# Patient Record
Sex: Male | Born: 1945 | Race: White | Hispanic: No | Marital: Married | Smoking: Former smoker
Health system: Southern US, Community
[De-identification: ages and names within clinical notes are randomized; demographics above are authoritative.]

## PROBLEM LIST (undated history)

## (undated) DIAGNOSIS — E785 Hyperlipidemia, unspecified: Secondary | ICD-10-CM

## (undated) DIAGNOSIS — C801 Malignant (primary) neoplasm, unspecified: Secondary | ICD-10-CM

## (undated) DIAGNOSIS — K449 Diaphragmatic hernia without obstruction or gangrene: Secondary | ICD-10-CM

## (undated) DIAGNOSIS — I10 Essential (primary) hypertension: Secondary | ICD-10-CM

## (undated) DIAGNOSIS — F32A Depression, unspecified: Secondary | ICD-10-CM

## (undated) DIAGNOSIS — H269 Unspecified cataract: Secondary | ICD-10-CM

## (undated) DIAGNOSIS — K219 Gastro-esophageal reflux disease without esophagitis: Secondary | ICD-10-CM

## (undated) DIAGNOSIS — E079 Disorder of thyroid, unspecified: Secondary | ICD-10-CM

## (undated) DIAGNOSIS — J449 Chronic obstructive pulmonary disease, unspecified: Secondary | ICD-10-CM

## (undated) DIAGNOSIS — T7840XA Allergy, unspecified, initial encounter: Secondary | ICD-10-CM

## (undated) DIAGNOSIS — M1990 Unspecified osteoarthritis, unspecified site: Secondary | ICD-10-CM

## (undated) DIAGNOSIS — F329 Major depressive disorder, single episode, unspecified: Secondary | ICD-10-CM

## (undated) DIAGNOSIS — G4730 Sleep apnea, unspecified: Secondary | ICD-10-CM

## (undated) HISTORY — DX: Chronic obstructive pulmonary disease, unspecified: J44.9

## (undated) HISTORY — DX: Sleep apnea, unspecified: G47.30

## (undated) HISTORY — PX: TONSILLECTOMY: SUR1361

## (undated) HISTORY — DX: Essential (primary) hypertension: I10

## (undated) HISTORY — PX: LAPAROSCOPIC PARAESOPHAGEAL HERNIA REPAIR: SHX6307

## (undated) HISTORY — DX: Diaphragmatic hernia without obstruction or gangrene: K44.9

## (undated) HISTORY — DX: Major depressive disorder, single episode, unspecified: F32.9

## (undated) HISTORY — PX: HERNIA REPAIR: SHX51

## (undated) HISTORY — DX: Unspecified cataract: H26.9

## (undated) HISTORY — DX: Hyperlipidemia, unspecified: E78.5

## (undated) HISTORY — DX: Unspecified osteoarthritis, unspecified site: M19.90

## (undated) HISTORY — DX: Depression, unspecified: F32.A

## (undated) HISTORY — DX: Malignant (primary) neoplasm, unspecified: C80.1

## (undated) HISTORY — DX: Allergy, unspecified, initial encounter: T78.40XA

## (undated) HISTORY — DX: Gastro-esophageal reflux disease without esophagitis: K21.9

## (undated) HISTORY — DX: Disorder of thyroid, unspecified: E07.9

## (undated) HISTORY — PX: JOINT REPLACEMENT: SHX530

## (undated) HISTORY — PX: APPENDECTOMY: SHX54

---

## 2009-11-24 ENCOUNTER — Ambulatory Visit: Payer: Self-pay | Admitting: Gastroenterology

## 2009-11-26 LAB — PATHOLOGY REPORT

## 2010-05-20 ENCOUNTER — Ambulatory Visit: Payer: Self-pay | Admitting: Internal Medicine

## 2010-06-27 ENCOUNTER — Ambulatory Visit: Payer: Self-pay | Admitting: Vascular Surgery

## 2011-01-12 ENCOUNTER — Encounter: Payer: Self-pay | Admitting: Internal Medicine

## 2011-01-13 ENCOUNTER — Ambulatory Visit (INDEPENDENT_AMBULATORY_CARE_PROVIDER_SITE_OTHER): Payer: BC Managed Care – PPO | Admitting: Internal Medicine

## 2011-01-13 ENCOUNTER — Encounter: Payer: Self-pay | Admitting: Internal Medicine

## 2011-01-13 ENCOUNTER — Ambulatory Visit: Payer: Self-pay | Admitting: Internal Medicine

## 2011-01-13 VITALS — BP 132/81 | HR 87 | Temp 98.4°F | Resp 16 | Ht 71.0 in | Wt 215.0 lb

## 2011-01-13 DIAGNOSIS — Z01810 Encounter for preprocedural cardiovascular examination: Secondary | ICD-10-CM

## 2011-01-13 DIAGNOSIS — I10 Essential (primary) hypertension: Secondary | ICD-10-CM | POA: Insufficient documentation

## 2011-01-13 LAB — PROTIME-INR
INR: 0.93 (ref <1.50)
Prothrombin Time: 12.9 seconds (ref 11.6–15.2)

## 2011-01-13 NOTE — Progress Notes (Signed)
Subjective:    Patient ID: Alexander Fitzgerald, male    DOB: 1945/06/18, 65 y.o.   MRN: [id number]  HPI Alexander Fitzgerald is a 65 year old male who presents for preoperative visit prior to blepharoplasty. He reports that he is doing well. He denies any complaints today. He denies any recent illness. He denies any fever, chills, shortness of breath, cough. He denies any chest pain or palpitations. He denies any prior history of adverse reaction to anesthesia. He denies any history of bleeding.  Outpatient Encounter Prescriptions as of 01/13/2011  Medication Sig Dispense Refill  . albuterol (PROAIR HFA) 108 (90 BASE) MCG/ACT inhaler Inhale 2 puffs into the lungs 4 (four) times daily as needed.        . Ascorbic Acid (VITAMIN C) 1000 MG tablet Take 1,000 mg by mouth daily.        Marland Kitchen aspirin 81 MG tablet Take 81 mg by mouth daily.        . Cyanocobalamin (VITAMIN B-12) 5000 MCG SUBL Place 2 each under the tongue daily.        . finasteride (PROSCAR) 5 MG tablet Take 1 tablet by mouth Daily.      . Fluticasone-Salmeterol (ADVAIR DISKUS) 250-50 MCG/DOSE AEPB Inhale 1 puff into the lungs every other day.        . folic acid (FOLVITE) 800 MCG tablet Take 800 mcg by mouth daily.       Marland Kitchen lisinopril (PRINIVIL,ZESTRIL) 10 MG tablet Take 10 mg by mouth daily.        . montelukast (SINGULAIR) 10 MG tablet Take 10 mg by mouth at bedtime.        . Multiple Vitamin (MULTIVITAMIN) capsule Take 1 capsule by mouth daily.        Marland Kitchen omeprazole (PRILOSEC) 20 MG capsule Take 20 mg by mouth daily.        . Pyridoxine HCl (VITAMIN B-6 CR) 200 MG TBCR Take by mouth.        . Pyridoxine HCl (VITAMIN B-6 PO) Take 1 capsule by mouth daily.          Review of Systems  Constitutional: Negative for fever, chills, activity change, appetite change, fatigue and unexpected weight change.  Eyes: Negative for visual disturbance.  Respiratory: Negative for cough and shortness of breath.   Cardiovascular: Negative for chest pain,  palpitations and leg swelling.  Gastrointestinal: Negative for abdominal pain and abdominal distention.  Genitourinary: Negative for dysuria, urgency and difficulty urinating.  Musculoskeletal: Negative for arthralgias and gait problem.  Skin: Negative for color change and rash.  Hematological: Negative for adenopathy.  Psychiatric/Behavioral: Negative for sleep disturbance and dysphoric mood. The patient is not nervous/anxious.    BP 132/81  Pulse 87  Temp(Src) 98.4 F (36.9 C) (Oral)  Resp 16  Ht 5\' 11"  (1.803 m)  Wt 215 lb (97.523 kg)  BMI 29.99 kg/m2  SpO2 97%     Objective:   Physical Exam  Constitutional: He is oriented to person, place, and time. He appears well-developed and well-nourished. No distress.  HENT:  Head: Normocephalic and atraumatic.  Right Ear: External ear normal.  Left Ear: External ear normal.  Nose: Nose normal.  Mouth/Throat: Oropharynx is clear and moist. No oropharyngeal exudate.  Eyes: Conjunctivae and EOM are normal. Pupils are equal, round, and reactive to light. Right eye exhibits no discharge. Left eye exhibits no discharge. No scleral icterus.  Neck: Normal range of motion. Neck supple. No tracheal deviation present. No thyromegaly present.  Cardiovascular:  Normal rate, regular rhythm and normal heart sounds.  Exam reveals no gallop and no friction rub.   No murmur heard. Pulmonary/Chest: Effort normal and breath sounds normal. No respiratory distress. He has no wheezes. He has no rales. He exhibits no tenderness.  Musculoskeletal: Normal range of motion. He exhibits no edema.  Lymphadenopathy:    He has no cervical adenopathy.  Neurological: He is alert and oriented to person, place, and time. No cranial nerve deficit. Coordination normal.  Skin: Skin is warm and dry. No rash noted. He is not diaphoretic. No erythema. No pallor.  Psychiatric: He has a normal mood and affect. His behavior is normal. Judgment and thought content normal.           Assessment & Plan:  1. Preop eval - based on modified risk criteria Alexander Fitzgerald would be low risk for perioperative cardiac events. His exam is normal today. The paperwork he brings today requires him to have an EKG and chest x-ray prior to surgery. It also requires lab work including CBC, CMP, and PT. we will order the above tests today. We will fax over the necessary documents to his surgeon. He will followup in clinic in 6 months.

## 2011-01-14 LAB — CBC WITH DIFFERENTIAL/PLATELET
Basophils Absolute: 0 10*3/uL (ref 0.0–0.1)
Basophils Relative: 0.2 % (ref 0.0–3.0)
Hemoglobin: 15.9 g/dL (ref 13.0–17.0)
Lymphocytes Relative: 19.5 % (ref 12.0–46.0)
Monocytes Relative: 4.4 % (ref 3.0–12.0)
Neutro Abs: 6.1 10*3/uL (ref 1.4–7.7)
Neutrophils Relative %: 74.1 % (ref 43.0–77.0)
RBC: 5.51 Mil/uL (ref 4.22–5.81)

## 2011-01-15 ENCOUNTER — Telehealth: Payer: Self-pay | Admitting: *Deleted

## 2011-01-15 ENCOUNTER — Encounter: Payer: Self-pay | Admitting: Internal Medicine

## 2011-01-15 ENCOUNTER — Telehealth: Payer: Self-pay | Admitting: Internal Medicine

## 2011-01-15 LAB — COMPREHENSIVE METABOLIC PANEL
Alkaline Phosphatase: 56 U/L (ref 39–117)
BUN: 22 mg/dL (ref 6–23)
Creatinine, Ser: 1.1 mg/dL (ref 0.4–1.5)
Glucose, Bld: 106 mg/dL — ABNORMAL HIGH (ref 70–99)
Sodium: 140 mEq/L (ref 135–145)
Total Bilirubin: 1.3 mg/dL — ABNORMAL HIGH (ref 0.3–1.2)

## 2011-01-15 NOTE — Telephone Encounter (Signed)
Message copied by Jobie Quaker on Fri Jan 15, 2011  9:00 AM ------      Message from: Ronna Polio A      Created: Thu Jan 14, 2011 12:22 PM      Regarding: CXR       CXR was normal

## 2011-01-15 NOTE — Telephone Encounter (Signed)
Patient notified

## 2011-01-18 NOTE — Telephone Encounter (Signed)
Opened in error

## 2011-01-19 ENCOUNTER — Ambulatory Visit (INDEPENDENT_AMBULATORY_CARE_PROVIDER_SITE_OTHER): Payer: BC Managed Care – PPO | Admitting: *Deleted

## 2011-01-19 DIAGNOSIS — R9431 Abnormal electrocardiogram [ECG] [EKG]: Secondary | ICD-10-CM

## 2011-01-21 ENCOUNTER — Telehealth: Payer: Self-pay | Admitting: Internal Medicine

## 2011-01-21 NOTE — Telephone Encounter (Signed)
I have [name] scheduled with Dr. Mariah Milling on the 9th of October at 2:30. I have also tried calling patient and left msg for him to return my call.

## 2011-01-21 NOTE — Telephone Encounter (Signed)
Message copied by Lysbeth Galas on Thu Jan 21, 2011  8:46 AM ------      Message from: Ronna Polio A      Created: Wed Jan 20, 2011  6:59 PM       Carollee Herter, Can you set this pt up with Dr. Mariah Milling? Needs to be fairly soon as he has pending surgery. Reason is abnormal EKG.

## 2011-01-25 ENCOUNTER — Encounter: Payer: Self-pay | Admitting: Internal Medicine

## 2011-01-26 ENCOUNTER — Ambulatory Visit: Payer: BC Managed Care – PPO | Admitting: Cardiovascular Disease

## 2011-01-27 ENCOUNTER — Telehealth: Payer: Self-pay | Admitting: Internal Medicine

## 2011-01-27 NOTE — Telephone Encounter (Signed)
Patient wants his appointment rescheduled with Dr. Lewie Loron in the early part of December . Please call patient on cell phone in regards to this appointment.

## 2011-01-28 ENCOUNTER — Encounter: Payer: Self-pay | Admitting: Internal Medicine

## 2011-01-29 ENCOUNTER — Encounter: Payer: Self-pay | Admitting: Cardiovascular Disease

## 2011-01-29 NOTE — Telephone Encounter (Signed)
When I spoke with Mr. Deiss he was giving the number to reschedule this appt. I also called Asher Muir to advise her that the patient would be calling in to reschedule this appt.

## 2011-03-03 ENCOUNTER — Ambulatory Visit (INDEPENDENT_AMBULATORY_CARE_PROVIDER_SITE_OTHER): Payer: BC Managed Care – PPO | Admitting: Internal Medicine

## 2011-03-03 ENCOUNTER — Encounter: Payer: Self-pay | Admitting: Internal Medicine

## 2011-03-03 ENCOUNTER — Telehealth: Payer: Self-pay | Admitting: *Deleted

## 2011-03-03 VITALS — BP 112/76 | HR 83 | Temp 98.4°F | Wt 213.0 lb

## 2011-03-03 DIAGNOSIS — K529 Noninfective gastroenteritis and colitis, unspecified: Secondary | ICD-10-CM

## 2011-03-03 DIAGNOSIS — K5289 Other specified noninfective gastroenteritis and colitis: Secondary | ICD-10-CM

## 2011-03-03 MED ORDER — METRONIDAZOLE 500 MG PO TABS: 500.0000 mg | ORAL_TABLET | Freq: Three times a day (TID) | ORAL | Status: AC

## 2011-03-03 MED ORDER — CIPROFLOXACIN HCL 500 MG PO TABS: 500.0000 mg | ORAL_TABLET | Freq: Two times a day (BID) | ORAL | Status: AC

## 2011-03-03 NOTE — Telephone Encounter (Signed)
Spoke w/pt - he c/o diarrhea x 1 wk, w/little help from imodium. No fever, visible blood or mucus in the stool. Advised apt and scheduled for OV today

## 2011-03-03 NOTE — Progress Notes (Signed)
Subjective:    Patient ID: Alexander Fitzgerald, male    DOB: May 03, 1945, 65 y.o.   MRN: [id number]  HPI 65 year old male presents for an acute visit complaining of diarrhea. He notes that approximately one week ago he was traveling in [place] when he developed the sudden onset of watery diarrhea. He reports several episodes of watery diarrhea per day. This persisted for several days. He denies any fever or chills during that time. He did have some mild crampy abdominal pain. He started taking Imodium and had resolution of his symptoms. He reports that he had a bowel movement this morning which was formed. He denies any blood in his stool. He denies any mucus in his stool. None of his travel companions experienced similar symptoms. He is planning to leave again for a trip tomorrow to [place], [place].   Outpatient Encounter Prescriptions as of 03/03/2011  Medication Sig Dispense Refill  . albuterol (PROAIR HFA) 108 (90 BASE) MCG/ACT inhaler Inhale 2 puffs into the lungs 4 (four) times daily as needed.        . Ascorbic Acid (VITAMIN C) 1000 MG tablet Take 1,000 mg by mouth daily.        Marland Kitchen aspirin 81 MG tablet Take 81 mg by mouth daily.        . Cyanocobalamin (VITAMIN B-12) 5000 MCG SUBL Place 2 each under the tongue daily.        . finasteride (PROSCAR) 5 MG tablet Take 1 tablet by mouth Daily.      . Fluticasone-Salmeterol (ADVAIR DISKUS) 250-50 MCG/DOSE AEPB Inhale 1 puff into the lungs every other day.        . folic acid (FOLVITE) 800 MCG tablet Take 800 mcg by mouth daily.       Marland Kitchen lisinopril (PRINIVIL,ZESTRIL) 10 MG tablet Take 10 mg by mouth daily.        . montelukast (SINGULAIR) 10 MG tablet Take 10 mg by mouth at bedtime.        . Multiple Vitamin (MULTIVITAMIN) capsule Take 1 capsule by mouth daily.        Marland Kitchen omeprazole (PRILOSEC) 20 MG capsule Take 20 mg by mouth daily.        . Pyridoxine HCl (VITAMIN B-6 CR) 200 MG TBCR Take by mouth.          Review of Systems  Constitutional:  Negative for fever, chills, activity change, appetite change, fatigue and unexpected weight change.  Eyes: Negative for visual disturbance.  Respiratory: Negative for cough and shortness of breath.   Cardiovascular: Negative for chest pain and leg swelling.  Gastrointestinal: Positive for nausea, abdominal pain (mild) and diarrhea. Negative for blood in stool, abdominal distention and anal bleeding.  Genitourinary: Negative for dysuria, urgency and difficulty urinating.  Musculoskeletal: Negative for arthralgias and gait problem.  Skin: Negative for color change and rash.  Hematological: Negative for adenopathy.   BP 112/76  Pulse 83  Temp(Src) 98.4 F (36.9 C) (Oral)  Wt 213 lb (96.616 kg)  SpO2 96%     Objective:   Physical Exam  Constitutional: He is oriented to person, place, and time. He appears well-developed and well-nourished. No distress.  HENT:  Head: Normocephalic and atraumatic.  Right Ear: External ear normal.  Left Ear: External ear normal.  Nose: Nose normal.  Mouth/Throat: Oropharynx is clear and moist. No oropharyngeal exudate.  Eyes: Conjunctivae and EOM are normal. Pupils are equal, round, and reactive to light. Right eye exhibits no discharge. Left eye exhibits  no discharge. No scleral icterus.  Neck: Normal range of motion. Neck supple. No tracheal deviation present. No thyromegaly present.  Cardiovascular: Normal rate, regular rhythm and normal heart sounds.  Exam reveals no gallop and no friction rub.   No murmur heard. Pulmonary/Chest: Effort normal and breath sounds normal. No respiratory distress. He has no wheezes. He has no rales. He exhibits no tenderness.  Abdominal: Soft. Bowel sounds are normal. He exhibits no distension and no mass. There is no tenderness. There is no rebound and no guarding.  Musculoskeletal: Normal range of motion. He exhibits no edema.  Lymphadenopathy:    He has no cervical adenopathy.  Neurological: He is alert and oriented to  person, place, and time. No cranial nerve deficit. Coordination normal.  Skin: Skin is warm and dry. No rash noted. He is not diaphoretic. No erythema. No pallor.  Psychiatric: He has a normal mood and affect. His behavior is normal. Judgment and thought content normal.          Assessment & Plan:  1. Gastroenteritis - Likely viral, appears to be resolved. However, given that he is planning to travel this week, will attempt to obtain a stool sample (should his diarrhea recur) to check for bacterial causes of gastroenteritis. Should his diarrhea recur, he will also start Cipro and Flagyl. He will call or return to clinic if his symptoms persist.

## 2011-03-29 ENCOUNTER — Telehealth: Payer: Self-pay | Admitting: *Deleted

## 2011-03-29 NOTE — Telephone Encounter (Signed)
OK on all

## 2011-03-29 NOTE — Telephone Encounter (Signed)
Pt left VM today -   1. Req RX for shingles vaccine 2. Req Rf of singulair, omeprazole and lisinopril to go to [facility].   OK?

## 2011-03-30 MED ORDER — ZOSTER VACCINE LIVE 19400 UNT/0.65ML ~~LOC~~ SOLR: 0.6500 mL | Freq: Once | SUBCUTANEOUS | Status: AC

## 2011-03-30 MED ORDER — MONTELUKAST SODIUM 10 MG PO TABS: 10.0000 mg | ORAL_TABLET | Freq: Every day | ORAL | Status: DC

## 2011-03-30 MED ORDER — LISINOPRIL 10 MG PO TABS: 10.0000 mg | ORAL_TABLET | Freq: Every day | ORAL | Status: DC

## 2011-03-30 MED ORDER — OMEPRAZOLE 20 MG PO CPDR: 20.0000 mg | DELAYED_RELEASE_CAPSULE | Freq: Every day | ORAL | Status: DC

## 2011-03-30 NOTE — Telephone Encounter (Signed)
Done, left vm on pt's cell to check w/pharm

## 2011-04-23 ENCOUNTER — Ambulatory Visit (INDEPENDENT_AMBULATORY_CARE_PROVIDER_SITE_OTHER): Payer: BC Managed Care – PPO | Admitting: Cardiovascular Disease

## 2011-04-23 ENCOUNTER — Encounter: Payer: Self-pay | Admitting: Cardiovascular Disease

## 2011-04-23 VITALS — BP 110/80 | HR 86 | Ht 71.0 in | Wt 211.0 lb

## 2011-04-23 DIAGNOSIS — I10 Essential (primary) hypertension: Secondary | ICD-10-CM

## 2011-04-23 DIAGNOSIS — I4510 Unspecified right bundle-branch block: Secondary | ICD-10-CM | POA: Insufficient documentation

## 2011-04-23 DIAGNOSIS — R9431 Abnormal electrocardiogram [ECG] [EKG]: Secondary | ICD-10-CM

## 2011-04-23 DIAGNOSIS — R0602 Shortness of breath: Secondary | ICD-10-CM | POA: Insufficient documentation

## 2011-04-23 NOTE — Assessment & Plan Note (Signed)
Blood pressure is well controlled on today's visit. No changes made to the medications. 

## 2011-04-23 NOTE — Patient Instructions (Addendum)
You are doing well. No medication changes were made.  Please call us if you have new issues that need to be addressed before your next appt.  You could consider RED YEAST RICE for cholesterol

## 2011-04-23 NOTE — Assessment & Plan Note (Signed)
We talked about his EKG which shows an incomplete right bundle branch block. Given that he does not have any symptoms, generally we do not need to perform any additional testing. He will contact us if he has any worsening shortness of breath or chest pain.

## 2011-04-23 NOTE — Progress Notes (Signed)
Patient ID: Alexander Fitzgerald, male    DOB: September 29, 1945, 66 y.o.   MRN: [id number]  HPI Comments: Alexander Fitzgerald is a very pleasant 66 year old gentleman with a 20 year smoking history, mild asthma exacerbated by upper respiratory infections, hypertension, gout, plantar fasciitis who presents by referral from Dr. Dan Humphreys for abnormal EKG and shortness of breath.  He reports that typically he is very active. He walks his dog 3 miles per day, plays golf. He is more deconditioned this year secondary to significant family stressors such as the loss of family members and other stressful event. He has not been working out as much as he normally does and has gained weight. He has 10-20 pounds more than he would like it feels more deconditioned. He is able to stay very active and has 3 flights of stairs at home that he can climb without difficulty. He reports that he was recently cleaning out the top level and climb the stairs 20 times only having to stop for brief periods. He has rare asthma exacerbations usually triggered by upper respiratory infection.   He denies any chest pain, lightheadedness or dizziness. He is uncertain if he has had previous EKGs. He has difficulty walking hills secondary to plantar fasciitis which has been severe.  EKG today shows normal sinus rhythm with incomplete right bundle branch block, rate 86 beats per minute no other significant ST or T wave changes   Outpatient Encounter Prescriptions as of 04/23/2011  Medication Sig Dispense Refill  . albuterol (PROAIR HFA) 108 (90 BASE) MCG/ACT inhaler Inhale 2 puffs into the lungs 4 (four) times daily as needed.        . Ascorbic Acid (VITAMIN C) 1000 MG tablet Take 1,000 mg by mouth daily.        Marland Kitchen aspirin 81 MG tablet Take 81 mg by mouth daily.        . Cyanocobalamin (VITAMIN B-12) 5000 MCG SUBL Place 2 each under the tongue daily.        . finasteride (PROSCAR) 5 MG tablet Take 1 tablet by mouth Daily.      . Fluticasone-Salmeterol  (ADVAIR DISKUS) 250-50 MCG/DOSE AEPB Inhale 1 puff into the lungs every other day.        . folic acid (FOLVITE) 800 MCG tablet Take 800 mcg by mouth daily.       Marland Kitchen lisinopril (PRINIVIL,ZESTRIL) 10 MG tablet Take 1 tablet (10 mg total) by mouth daily.  90 tablet  1  . montelukast (SINGULAIR) 10 MG tablet Take 1 tablet (10 mg total) by mouth at bedtime.  90 tablet  1  . Multiple Vitamin (MULTIVITAMIN) capsule Take 1 capsule by mouth daily.        Marland Kitchen omeprazole (PRILOSEC) 20 MG capsule Take 1 capsule (20 mg total) by mouth daily.  90 capsule  1  . Pyridoxine HCl (VITAMIN B-6 CR) 200 MG TBCR Take by mouth.           Review of Systems  Constitutional: Negative.   HENT: Negative.   Eyes: Negative.   Respiratory: Negative.   Cardiovascular: Negative.   Gastrointestinal: Negative.   Musculoskeletal: Negative.   Skin: Negative.   Neurological: Negative.   Hematological: Negative.   Psychiatric/Behavioral: Negative.   All other systems reviewed and are negative.    BP 110/80  Pulse 86  Ht 5\' 11"  (1.803 m)  Wt 211 lb (95.709 kg)  BMI 29.43 kg/m2  Physical Exam  Nursing note and vitals reviewed. Constitutional: He is  oriented to person, place, and time. He appears well-developed and well-nourished.  HENT:  Head: Normocephalic.  Nose: Nose normal.  Mouth/Throat: Oropharynx is clear and moist.  Eyes: Conjunctivae are normal. Pupils are equal, round, and reactive to light.  Neck: Normal range of motion. Neck supple. No JVD present.  Cardiovascular: Normal rate, regular rhythm, S1 normal, S2 normal, normal heart sounds and intact distal pulses.  Exam reveals no gallop and no friction rub.   No murmur heard. Pulmonary/Chest: Effort normal and breath sounds normal. No respiratory distress. He has no wheezes. He has no rales. He exhibits no tenderness.  Abdominal: Soft. Bowel sounds are normal. He exhibits no distension. There is no tenderness.  Musculoskeletal: Normal range of motion. He  exhibits no edema and no tenderness.  Lymphadenopathy:    He has no cervical adenopathy.  Neurological: He is alert and oriented to person, place, and time. Coordination normal.  Skin: Skin is warm and dry. No rash noted. No erythema.  Psychiatric: He has a normal mood and affect. His behavior is normal. Judgment and thought content normal.           Assessment and Plan

## 2011-04-23 NOTE — Assessment & Plan Note (Signed)
We spent some time talking about his shortness of breath. He is otherwise very active even with moderate activity.His weight is up from his baseline and he is more deconditioned.  He would be unable to treadmill given his plantar fasciitis. We will continue to monitor him and if symptoms get worse we did discuss cardiac CT or Myoview stress test. I feel it is safe to watch him for now and he can call Dr. Dan Humphreys or our office if he has worsening symptoms. We have suggested he stay on aspirin. Cholesterol is well controlled.

## 2011-04-27 LAB — HM COLONOSCOPY: HM Colonoscopy: NORMAL

## 2011-07-26 ENCOUNTER — Telehealth: Payer: Self-pay | Admitting: Internal Medicine

## 2011-07-26 MED ORDER — FINASTERIDE 5 MG PO TABS: 5.0000 mg | ORAL_TABLET | Freq: Every day | ORAL | Status: DC

## 2011-07-26 NOTE — Telephone Encounter (Signed)
That is fine 

## 2011-07-26 NOTE — Telephone Encounter (Signed)
Is this okay?

## 2011-07-26 NOTE — Telephone Encounter (Signed)
[phone] Pt walked in wanting rx for finaresride 5mg  everyother day  Dr Madelaine Bhat [facility] orginally wrote the rx [employer]

## 2011-07-27 NOTE — Telephone Encounter (Signed)
Patient notified by telephone. 

## 2011-08-13 ENCOUNTER — Telehealth: Payer: Self-pay | Admitting: *Deleted

## 2011-08-13 DIAGNOSIS — Z23 Encounter for immunization: Secondary | ICD-10-CM

## 2011-08-13 MED ORDER — ZOSTER VACCINE LIVE 19400 UNT/0.65ML ~~LOC~~ SOLR: 0.6500 mL | Freq: Once | SUBCUTANEOUS | Status: AC

## 2011-08-13 NOTE — Telephone Encounter (Signed)
[phone]  Pt woulld rx for the singles injection Pt would like to pick up rx please call when ready

## 2011-08-13 NOTE — Telephone Encounter (Signed)
Patient notified that rx is ready for pick up

## 2011-08-13 NOTE — Telephone Encounter (Signed)
Is this okay?

## 2011-08-13 NOTE — Telephone Encounter (Signed)
Rx for shingles vaccine printed.

## 2011-09-29 DIAGNOSIS — H04123 Dry eye syndrome of bilateral lacrimal glands: Secondary | ICD-10-CM | POA: Insufficient documentation

## 2011-10-12 DIAGNOSIS — H01003 Unspecified blepharitis right eye, unspecified eyelid: Secondary | ICD-10-CM | POA: Insufficient documentation

## 2011-10-12 DIAGNOSIS — H04129 Dry eye syndrome of unspecified lacrimal gland: Secondary | ICD-10-CM | POA: Insufficient documentation

## 2011-10-24 ENCOUNTER — Other Ambulatory Visit: Payer: Self-pay | Admitting: Internal Medicine

## 2011-10-27 ENCOUNTER — Telehealth: Payer: Self-pay | Admitting: *Deleted

## 2011-10-27 NOTE — Telephone Encounter (Signed)
Received fax from pharmacy stating that PA is needed for Singulair 10mg .  PA form given to Dr. Dan Humphreys for completion and signature.

## 2011-11-03 ENCOUNTER — Telehealth: Payer: Self-pay | Admitting: *Deleted

## 2011-11-03 NOTE — Telephone Encounter (Signed)
PA form given to Dr. Dan Humphreys.

## 2011-11-03 NOTE — Telephone Encounter (Signed)
Received fax from pharmacy stating that PA is needed on Singulair 10mg .  Called [employer] [place] at [phone] and requested PA form, they will fax form in about 5 minutes.

## 2011-11-04 ENCOUNTER — Other Ambulatory Visit: Payer: Self-pay | Admitting: *Deleted

## 2011-11-05 ENCOUNTER — Telehealth: Payer: Self-pay | Admitting: Internal Medicine

## 2011-11-05 NOTE — Telephone Encounter (Signed)
Aline August @ [employer] called to let you know she has approval for the medication she will also sent letter

## 2011-11-05 NOTE — Telephone Encounter (Signed)
Patient advised as instructed via telephone. 

## 2012-01-21 ENCOUNTER — Other Ambulatory Visit: Payer: Self-pay | Admitting: Internal Medicine

## 2012-04-06 ENCOUNTER — Other Ambulatory Visit: Payer: Self-pay | Admitting: Internal Medicine

## 2012-04-26 ENCOUNTER — Ambulatory Visit (INDEPENDENT_AMBULATORY_CARE_PROVIDER_SITE_OTHER): Payer: Medicare Other | Admitting: Internal Medicine

## 2012-04-26 ENCOUNTER — Encounter: Payer: Self-pay | Admitting: Internal Medicine

## 2012-04-26 VITALS — BP 130/90 | HR 85 | Temp 97.8°F | Ht 71.0 in | Wt 210.0 lb

## 2012-04-26 DIAGNOSIS — Z0000 Encounter for general adult medical examination without abnormal findings: Secondary | ICD-10-CM

## 2012-04-26 DIAGNOSIS — I10 Essential (primary) hypertension: Secondary | ICD-10-CM

## 2012-04-26 DIAGNOSIS — Z125 Encounter for screening for malignant neoplasm of prostate: Secondary | ICD-10-CM

## 2012-04-26 DIAGNOSIS — E785 Hyperlipidemia, unspecified: Secondary | ICD-10-CM

## 2012-04-26 LAB — LIPID PANEL
HDL: 43.5 mg/dL (ref >39.00)
LDL Cholesterol: 105 mg/dL — ABNORMAL HIGH (ref 0–99)
Total CHOL/HDL Ratio: 4
Triglycerides: 140 mg/dL (ref 0.0–149.0)
VLDL: 28 mg/dL (ref 0.0–40.0)

## 2012-04-26 LAB — COMPREHENSIVE METABOLIC PANEL
AST: 30 U/L (ref 0–37)
Albumin: 3.9 g/dL (ref 3.5–5.2)
Alkaline Phosphatase: 58 U/L (ref 39–117)
BUN: 14 mg/dL (ref 6–23)
Creatinine, Ser: 1.1 mg/dL (ref 0.4–1.5)
Potassium: 4.2 mEq/L (ref 3.5–5.1)
Total Bilirubin: 1.2 mg/dL (ref 0.3–1.2)

## 2012-04-26 MED ORDER — ZOSTER VACCINE LIVE 19400 UNT/0.65ML ~~LOC~~ SOLR: 0.6500 mL | Freq: Once | SUBCUTANEOUS | Status: DC

## 2012-04-26 NOTE — Assessment & Plan Note (Signed)
BP well controlled on current medications. Will continue current medications. Will check renal function and urine microalbumin with labs today. Follow up in 6 months and prn.

## 2012-04-26 NOTE — Progress Notes (Signed)
Subjective:    Patient ID: Quayshawn Nin, male    DOB: Jul 31, 1945, 67 y.o.   MRN: [id number]  HPI 67 year old male with history of hypertension presents for followup. He reports he is generally feeling well. He reports full compliance with his medications. He denies any recent chest pain, palpitations, headache. He denies any new concerns today.  Outpatient Encounter Prescriptions as of 04/26/2012  Medication Sig Dispense Refill  . albuterol (PROAIR HFA) 108 (90 BASE) MCG/ACT inhaler Inhale 2 puffs into the lungs 4 (four) times daily as needed.        . Ascorbic Acid (VITAMIN C) 1000 MG tablet Take 1,000 mg by mouth daily.        Marland Kitchen aspirin 81 MG tablet Take 81 mg by mouth daily.        . Cyanocobalamin (VITAMIN B-12) 5000 MCG SUBL Place 2 each under the tongue daily.        . finasteride (PROSCAR) 5 MG tablet Take 1 tablet (5 mg total) by mouth daily.  30 tablet  3  . Fluticasone-Salmeterol (ADVAIR DISKUS) 250-50 MCG/DOSE AEPB Inhale 1 puff into the lungs every other day.        . folic acid (FOLVITE) 800 MCG tablet Take 800 mcg by mouth daily.       Marland Kitchen lisinopril (PRINIVIL,ZESTRIL) 10 MG tablet TAKE 1 TABLET (10 MG TOTAL) BY MOUTH DAILY.  90 tablet  1  . montelukast (SINGULAIR) 10 MG tablet TAKE 1 TABLET (10 MG TOTAL) BY MOUTH AT BEDTIME.  90 tablet  1  . Multiple Vitamin (MULTIVITAMIN) capsule Take 1 capsule by mouth daily.        Marland Kitchen omeprazole (PRILOSEC) 20 MG capsule TAKE 1 CAPSULE (20 MG TOTAL) BY MOUTH DAILY.  90 capsule  1  . Pyridoxine HCl (VITAMIN B-6 CR) 200 MG TBCR Take by mouth.        . zoster vaccine live, PF, (ZOSTAVAX) 29562 UNT/0.65ML injection Inject 19,400 Units into the skin once.  1 each  0   BP 130/90  Pulse 85  Temp 97.8 F (36.6 C) (Oral)  Ht 5\' 11"  (1.803 m)  Wt 210 lb (95.255 kg)  BMI 29.29 kg/m2  SpO2 95%  Review of Systems  Constitutional: Negative for fever, chills, activity change, appetite change, fatigue and unexpected weight change.  Eyes: Negative  for visual disturbance.  Respiratory: Negative for cough and shortness of breath.   Cardiovascular: Negative for chest pain, palpitations and leg swelling.  Gastrointestinal: Negative for abdominal pain and abdominal distention.  Genitourinary: Negative for dysuria, urgency and difficulty urinating.  Musculoskeletal: Negative for arthralgias and gait problem.  Skin: Negative for color change and rash.  Hematological: Negative for adenopathy.  Psychiatric/Behavioral: Negative for sleep disturbance and dysphoric mood. The patient is not nervous/anxious.        Objective:   Physical Exam  Constitutional: He is oriented to person, place, and time. He appears well-developed and well-nourished. No distress.  HENT:  Head: Normocephalic and atraumatic.  Right Ear: External ear normal.  Left Ear: External ear normal.  Nose: Nose normal.  Mouth/Throat: Oropharynx is clear and moist. No oropharyngeal exudate.  Eyes: Conjunctivae normal and EOM are normal. Pupils are equal, round, and reactive to light. Right eye exhibits no discharge. Left eye exhibits no discharge. No scleral icterus.  Neck: Normal range of motion. Neck supple. No tracheal deviation present. No thyromegaly present.  Cardiovascular: Normal rate, regular rhythm and normal heart sounds.  Exam reveals no gallop and no  friction rub.   No murmur heard. Pulmonary/Chest: Effort normal and breath sounds normal. No respiratory distress. He has no wheezes. He has no rales. He exhibits no tenderness.  Musculoskeletal: Normal range of motion. He exhibits no edema.  Lymphadenopathy:    He has no cervical adenopathy.  Neurological: He is alert and oriented to person, place, and time. No cranial nerve deficit. Coordination normal.  Skin: Skin is warm and dry. No rash noted. He is not diaphoretic. No erythema. No pallor.  Psychiatric: He has a normal mood and affect. His behavior is normal. Judgment and thought content normal.            Assessment & Plan:

## 2012-04-26 NOTE — Assessment & Plan Note (Signed)
Discussed risks and benefits of PSA testing. Will check PSA with labs today.

## 2012-04-27 ENCOUNTER — Encounter: Payer: Self-pay | Admitting: *Deleted

## 2012-06-11 ENCOUNTER — Other Ambulatory Visit: Payer: Self-pay | Admitting: Internal Medicine

## 2012-07-04 ENCOUNTER — Encounter: Payer: Self-pay | Admitting: Internal Medicine

## 2012-08-08 ENCOUNTER — Ambulatory Visit: Payer: Medicare Other | Admitting: Internal Medicine

## 2012-08-30 ENCOUNTER — Other Ambulatory Visit: Payer: Self-pay | Admitting: Internal Medicine

## 2012-08-30 NOTE — Telephone Encounter (Signed)
Eprescribed.

## 2012-09-29 ENCOUNTER — Encounter: Payer: Self-pay | Admitting: Adult Health

## 2012-09-29 ENCOUNTER — Ambulatory Visit (INDEPENDENT_AMBULATORY_CARE_PROVIDER_SITE_OTHER): Payer: Medicare Other | Admitting: Adult Health

## 2012-09-29 VITALS — BP 112/66 | HR 105 | Resp 12 | Wt 210.0 lb

## 2012-09-29 DIAGNOSIS — M79675 Pain in left toe(s): Secondary | ICD-10-CM

## 2012-09-29 DIAGNOSIS — M79609 Pain in unspecified limb: Secondary | ICD-10-CM

## 2012-09-29 LAB — URIC ACID: Uric Acid, Serum: 7.3 mg/dL (ref 4.0–7.8)

## 2012-09-29 MED ORDER — MELOXICAM 15 MG PO TABS: 15.0000 mg | ORAL_TABLET | Freq: Every day | ORAL | Status: DC

## 2012-09-29 NOTE — Assessment & Plan Note (Signed)
Start high-dose NSAIDs. I have also given him prescription for meloxicam 15 mg x 14 days in the event that the over-the-counter NSAIDs do not alleviate his discomfort. Check uric acid.

## 2012-09-29 NOTE — Progress Notes (Signed)
  Subjective:    Patient ID: Alexander Fitzgerald, male    DOB: 30-Jul-1945, 67 y.o.   MRN: [id number]  HPI  Patient is a pleasant 67 y/o male who presents to clinic with c/o left great toe pain. Pt has hx of gout. He has taken Advil this morning. Patient has been traveling recently and feels may not have paid too much attention to maintaining his hydration.   Current Outpatient Prescriptions on File Prior to Visit  Medication Sig Dispense Refill  . albuterol (PROAIR HFA) 108 (90 BASE) MCG/ACT inhaler Inhale 2 puffs into the lungs 4 (four) times daily as needed.        . Ascorbic Acid (VITAMIN C) 1000 MG tablet Take 1,000 mg by mouth daily.        Marland Kitchen aspirin 81 MG tablet Take 81 mg by mouth daily.        . Cyanocobalamin (VITAMIN B-12) 5000 MCG SUBL Place 2 each under the tongue daily.        . finasteride (PROSCAR) 5 MG tablet TAKE 1 TABLET (5 MG TOTAL) BY MOUTH DAILY.  30 tablet  3  . Fluticasone-Salmeterol (ADVAIR DISKUS) 250-50 MCG/DOSE AEPB Inhale 1 puff into the lungs every other day.        . folic acid (FOLVITE) 800 MCG tablet Take 800 mcg by mouth daily.       Marland Kitchen lisinopril (PRINIVIL,ZESTRIL) 10 MG tablet TAKE 1 TABLET BY MOUTH EVERY DAY  90 tablet  0  . montelukast (SINGULAIR) 10 MG tablet TAKE 1 TABLET (10 MG TOTAL) BY MOUTH AT BEDTIME.  90 tablet  1  . Multiple Vitamin (MULTIVITAMIN) capsule Take 1 capsule by mouth daily.        Marland Kitchen omeprazole (PRILOSEC) 20 MG capsule TAKE ONE CAPSULE BY MOUTH EVERY DAY  90 capsule  0  . Pyridoxine HCl (VITAMIN B-6 CR) 200 MG TBCR Take by mouth.         No current facility-administered medications on file prior to visit.     Review of Systems  Musculoskeletal:       Left great toe pain   BP 112/66  Pulse 105  Resp 12  Wt 210 lb (95.255 kg)  BMI 29.3 kg/m2  SpO2 96%     Objective:   Physical Exam  Constitutional: He appears well-developed and well-nourished. No distress.  Neurological: He is alert.  Skin: There is erythema.  Erythema and  edema of left great toe  Psychiatric: He has a normal mood and affect. His behavior is normal. Judgment and thought content normal.          Assessment & Plan:

## 2012-10-30 ENCOUNTER — Encounter: Payer: Medicare Other | Admitting: Internal Medicine

## 2012-11-06 ENCOUNTER — Encounter: Payer: Medicare Other | Admitting: Internal Medicine

## 2012-11-14 ENCOUNTER — Encounter: Payer: Medicare Other | Admitting: Internal Medicine

## 2012-11-26 ENCOUNTER — Other Ambulatory Visit: Payer: Self-pay | Admitting: Internal Medicine

## 2012-11-27 NOTE — Telephone Encounter (Signed)
Eprescribed.

## 2012-12-04 ENCOUNTER — Encounter: Payer: Self-pay | Admitting: Internal Medicine

## 2012-12-04 ENCOUNTER — Ambulatory Visit (INDEPENDENT_AMBULATORY_CARE_PROVIDER_SITE_OTHER): Payer: Medicare Other | Admitting: Internal Medicine

## 2012-12-04 VITALS — BP 120/80 | HR 86 | Temp 98.3°F | Ht 69.5 in | Wt 211.0 lb

## 2012-12-04 DIAGNOSIS — I4510 Unspecified right bundle-branch block: Secondary | ICD-10-CM

## 2012-12-04 DIAGNOSIS — I10 Essential (primary) hypertension: Secondary | ICD-10-CM

## 2012-12-04 DIAGNOSIS — Z0000 Encounter for general adult medical examination without abnormal findings: Secondary | ICD-10-CM

## 2012-12-04 DIAGNOSIS — J45901 Unspecified asthma with (acute) exacerbation: Secondary | ICD-10-CM

## 2012-12-04 LAB — COMPREHENSIVE METABOLIC PANEL
ALT: 45 U/L (ref 0–53)
AST: 33 U/L (ref 0–37)
Albumin: 4.1 g/dL (ref 3.5–5.2)
Alkaline Phosphatase: 59 U/L (ref 39–117)
BUN: 15 mg/dL (ref 6–23)
Calcium: 9.2 mg/dL (ref 8.4–10.5)
Chloride: 105 mEq/L (ref 96–112)
Potassium: 4.3 mEq/L (ref 3.5–5.1)
Sodium: 136 mEq/L (ref 135–145)
Total Protein: 7.2 g/dL (ref 6.0–8.3)

## 2012-12-04 LAB — LIPID PANEL
HDL: 44 mg/dL (ref >39.00)
LDL Cholesterol: 98 mg/dL (ref 0–99)
Total CHOL/HDL Ratio: 4
Triglycerides: 148 mg/dL (ref 0.0–149.0)
VLDL: 29.6 mg/dL (ref 0.0–40.0)

## 2012-12-04 MED ORDER — PREDNISONE (PAK) 10 MG PO TABS: ORAL_TABLET | ORAL | Status: DC

## 2012-12-04 MED ORDER — ZOSTER VACCINE LIVE 19400 UNT/0.65ML ~~LOC~~ SOLR: 0.6500 mL | Freq: Once | SUBCUTANEOUS | Status: DC

## 2012-12-04 NOTE — Assessment & Plan Note (Signed)
Symptoms and exam are consistent with asthma with acute exacerbation. Will start prednisone taper. Will continue Advair and albuterol as needed. Continue Singulair. Patient will call if no significant improvement by Friday. If no improvement, would favor getting chest x-ray.

## 2012-12-04 NOTE — Progress Notes (Signed)
Subjective:    Patient ID: Alexander Fitzgerald, male    DOB: 09-27-1945, 67 y.o.   MRN: [id number]  HPI The patient is here for annual Medicare wellness examination and management of other chronic and acute problems.   The risk factors are reflected in the social history.  The roster of all physicians providing medical care to patient - is listed in the Snapshot section of the chart.  Activities of daily living:  The patient is 100% independent in all ADLs: dressing, toileting, feeding as well as independent mobility. Recently returned from [place] to [place]. Able to fully participate in activities including walking tours.  Home safety : The patient has smoke detectors in the home. They wear seatbelts.  There are no firearms at home. There is no violence in the home.   There is no risks for hepatitis, STDs or HIV. There is no history of blood transfusion. They have no travel history to infectious disease endemic areas of the world.  The patient has seen their dentist in the last six month.  Dentist - Dr. Chandra Batch They have seen their eye doctor in the last year. Recently treated for ocular rosacea with Dr. Chales Abrahams. General Opthalmology - Dr. Rachael Darby No issues with hearing. They have deferred audiologic testing in the last year.   They do not  have excessive sun exposure. Discussed the need for sun protection: hats, long sleeves and use of sunscreen if there is significant sun exposure.  Dermatologist - Dr. Gwen Pounds  Diet: the importance of a healthy diet is discussed. They do have a relatively healthy diet.  The benefits of regular aerobic exercise were discussed. He occasionally golfs or uses treadmill. Also walks Dog.  Depression screen: there are no signs or vegative symptoms of depression- irritability, change in appetite, anhedonia, sadness/tearfullness.  Cognitive assessment: the patient manages all their financial and personal affairs and is actively engaged. They could relate  day,date,year and events.  The following portions of the patient's history were reviewed and updated as appropriate: allergies, current medications, past family history, past medical history,  past surgical history, past social history  and problem list.  Visual acuity was not assessed per patient preference since he has regular follow up with his ophthalmologist. Hearing and body mass index were assessed and reviewed.   During the course of the visit the patient was educated and counseled about appropriate screening and preventive services including : fall prevention , diabetes screening, nutrition counseling, colorectal cancer screening, and recommended immunizations.    He is also concerned about several days of shortness of breath and cough. This is consistent with previous asthma exacerbations. Symptoms began over the weekend. No clear trigger. He has been taking Advair daily, albuterol as needed and Singulair daily with minimal improvement. He denies any fever, chills, productive cough, chest pain.   Outpatient Encounter Prescriptions as of 12/04/2012  Medication Sig Dispense Refill  . albuterol (PROAIR HFA) 108 (90 BASE) MCG/ACT inhaler Inhale 2 puffs into the lungs 4 (four) times daily as needed.        . Ascorbic Acid (VITAMIN C) 1000 MG tablet Take 1,000 mg by mouth daily.        Marland Kitchen aspirin 81 MG tablet Take 81 mg by mouth daily.        . Cyanocobalamin (VITAMIN B-12) 5000 MCG SUBL Place 2 each under the tongue daily.        . finasteride (PROSCAR) 5 MG tablet TAKE 1 TABLET (5 MG TOTAL) BY MOUTH DAILY.  30 tablet  3  . Fluticasone-Salmeterol (ADVAIR DISKUS) 250-50 MCG/DOSE AEPB Inhale 1 puff into the lungs every other day.        . folic acid (FOLVITE) 800 MCG tablet Take 800 mcg by mouth daily.       Marland Kitchen lisinopril (PRINIVIL,ZESTRIL) 10 MG tablet TAKE 1 TABLET BY MOUTH EVERY DAY  90 tablet  0  . montelukast (SINGULAIR) 10 MG tablet TAKE 1 TABLET (10 MG TOTAL) BY MOUTH AT BEDTIME.  90  tablet  0  . Multiple Vitamin (MULTIVITAMIN) capsule Take 1 capsule by mouth daily.        Marland Kitchen omeprazole (PRILOSEC) 20 MG capsule TAKE ONE CAPSULE BY MOUTH EVERY DAY  90 capsule  0  . Pyridoxine HCl (VITAMIN B-6 CR) 200 MG TBCR Take by mouth.        . meloxicam (MOBIC) 15 MG tablet Take 1 tablet (15 mg total) by mouth daily.  30 tablet  0  . predniSONE (STERAPRED UNI-PAK) 10 MG tablet Take 60mg  day 1 then taper by 10mg  daily  21 tablet  0  . zoster vaccine live, PF, (ZOSTAVAX) 16109 UNT/0.65ML injection Inject 19,400 Units into the skin once.  1 each  0   No facility-administered encounter medications on file as of 12/04/2012.   BP 120/80  Pulse 86  Temp(Src) 98.3 F (36.8 C) (Oral)  Ht 5' 9.5" (1.765 m)  Wt 211 lb (95.709 kg)  BMI 30.72 kg/m2  SpO2 94%  Review of Systems  Constitutional: Negative for fever, chills, activity change, appetite change, fatigue and unexpected weight change.  Eyes: Negative for visual disturbance.  Respiratory: Positive for cough, shortness of breath and wheezing.   Cardiovascular: Negative for chest pain, palpitations and leg swelling.  Gastrointestinal: Negative for abdominal pain and abdominal distention.  Genitourinary: Negative for dysuria, urgency and difficulty urinating.  Musculoskeletal: Negative for arthralgias and gait problem.  Skin: Negative for color change and rash.  Hematological: Negative for adenopathy.  Psychiatric/Behavioral: Negative for sleep disturbance and dysphoric mood. The patient is not nervous/anxious.        Objective:   Physical Exam  Constitutional: He is oriented to person, place, and time. He appears well-developed and well-nourished. No distress.  HENT:  Head: Normocephalic and atraumatic.  Right Ear: External ear normal.  Left Ear: External ear normal.  Nose: Nose normal.  Mouth/Throat: Oropharynx is clear and moist. No oropharyngeal exudate.  Eyes: Conjunctivae and EOM are normal. Pupils are equal, round, and  reactive to light. Right eye exhibits no discharge. Left eye exhibits no discharge. No scleral icterus.  Neck: Normal range of motion. Neck supple. No tracheal deviation present. No thyromegaly present.  Cardiovascular: Normal rate, regular rhythm and normal heart sounds.  Exam reveals no gallop and no friction rub.   No murmur heard. Pulmonary/Chest: Effort normal. No accessory muscle usage. Not tachypneic. No respiratory distress. He has decreased breath sounds. He has wheezes. He has no rales. He exhibits no tenderness.  Abdominal: Soft. Bowel sounds are normal. He exhibits no distension and no mass. There is no tenderness. There is no rebound and no guarding.  Musculoskeletal: Normal range of motion. He exhibits no edema.  Lymphadenopathy:    He has no cervical adenopathy.  Neurological: He is alert and oriented to person, place, and time. No cranial nerve deficit. Coordination normal.  Skin: Skin is warm and dry. No rash noted. He is not diaphoretic. No erythema. No pallor.  Psychiatric: He has a normal mood and affect.  His behavior is normal. Judgment and thought content normal.          Assessment & Plan:

## 2012-12-04 NOTE — Assessment & Plan Note (Signed)
BP Readings from Last 3 Encounters:  12/04/12 120/80  09/29/12 112/66  04/26/12 130/90   Blood pressure well-controlled on lisinopril. Will continue. Will check renal function with labs today.

## 2012-12-04 NOTE — Assessment & Plan Note (Signed)
General medical exam normal today. Health maintenance is up to date except for Zostavax. Zostavax ordered today. Colonoscopy due in 2016. PSA testing performed in February 2014. Encouraged healthy diet and regular physical activity. Appropriate screening performed.

## 2012-12-04 NOTE — Assessment & Plan Note (Signed)
Noted on EKG last year. Stress test was normal.

## 2012-12-05 ENCOUNTER — Encounter: Payer: Self-pay | Admitting: *Deleted

## 2012-12-24 ENCOUNTER — Other Ambulatory Visit: Payer: Self-pay | Admitting: Internal Medicine

## 2012-12-25 ENCOUNTER — Other Ambulatory Visit: Payer: Self-pay | Admitting: Internal Medicine

## 2012-12-25 NOTE — Telephone Encounter (Signed)
Eprescribed.

## 2013-01-08 ENCOUNTER — Other Ambulatory Visit: Payer: Self-pay | Admitting: Internal Medicine

## 2013-04-03 ENCOUNTER — Telehealth: Payer: Self-pay | Admitting: Internal Medicine

## 2013-04-03 NOTE — Telephone Encounter (Signed)
Dropped off paperwork for physical activity.  Please contact pt, would like to pick up when ready.

## 2013-04-03 NOTE — Telephone Encounter (Signed)
In folder to Dr. Dan Humphreys

## 2013-04-05 NOTE — Telephone Encounter (Signed)
Left detailed message on patient voicemail, form has been completed and ready for pick up

## 2013-05-01 ENCOUNTER — Encounter: Payer: Self-pay | Admitting: Internal Medicine

## 2013-05-01 ENCOUNTER — Other Ambulatory Visit: Payer: Self-pay | Admitting: Internal Medicine

## 2013-05-01 DIAGNOSIS — R1314 Dysphagia, pharyngoesophageal phase: Secondary | ICD-10-CM

## 2013-05-04 ENCOUNTER — Ambulatory Visit (INDEPENDENT_AMBULATORY_CARE_PROVIDER_SITE_OTHER): Payer: Medicare PPO | Admitting: Adult Health

## 2013-05-04 ENCOUNTER — Encounter: Payer: Self-pay | Admitting: Adult Health

## 2013-05-04 VITALS — BP 112/62 | HR 96 | Resp 12 | Wt 215.0 lb

## 2013-05-04 DIAGNOSIS — K224 Dyskinesia of esophagus: Secondary | ICD-10-CM

## 2013-05-04 NOTE — Progress Notes (Signed)
   Subjective:    Patient ID: Ashleigh Arya, male    DOB: 23-Dec-1945, 68 y.o.   MRN: [id number]  HPI  Pt is a 68 y/o male who presents with concerns of having food "get stuck" mid way to lower esophagus. It happens any time of day. Some discomfort associated with the episodes. Hx of GERD and hiatal hernia. On prilosec but has missed a few doses. Has seen a GI specialist years ago for GERD. Not ever been scoped.   Current Outpatient Prescriptions on File Prior to Visit  Medication Sig Dispense Refill  . albuterol (PROAIR HFA) 108 (90 BASE) MCG/ACT inhaler Inhale 2 puffs into the lungs 4 (four) times daily as needed.        . Ascorbic Acid (VITAMIN C) 1000 MG tablet Take 1,000 mg by mouth daily.        Marland Kitchen aspirin 81 MG tablet Take 81 mg by mouth daily.        . Cyanocobalamin (VITAMIN B-12) 5000 MCG SUBL Place 2 each under the tongue daily.        . finasteride (PROSCAR) 5 MG tablet TAKE 1 TABLET BY MOUTH EVERY DAY  30 tablet  6  . Fluticasone-Salmeterol (ADVAIR DISKUS) 250-50 MCG/DOSE AEPB Inhale 1 puff into the lungs as needed.       Marland Kitchen lisinopril (PRINIVIL,ZESTRIL) 10 MG tablet TAKE 1 TABLET EVERY DAY  90 tablet  1  . montelukast (SINGULAIR) 10 MG tablet TAKE 1 TABLET BY MOUTH AT BEDTIME  90 tablet  3  . Multiple Vitamin (MULTIVITAMIN) capsule Take 1 capsule by mouth daily.        Marland Kitchen omeprazole (PRILOSEC) 20 MG capsule TAKE ONE CAPSULE BY MOUTH EVERY DAY  90 capsule  0  . omeprazole (PRILOSEC) 20 MG capsule TAKE ONE CAPSULE BY MOUTH EVERY DAY  90 capsule  3  . Pyridoxine HCl (VITAMIN B-6 CR) 200 MG TBCR Take by mouth.        . folic acid (FOLVITE) 329 MCG tablet Take 800 mcg by mouth daily.        No current facility-administered medications on file prior to visit.    Review of Systems  Constitutional: Negative.   HENT: Negative.   Respiratory: Negative.   Cardiovascular: Negative.   Gastrointestinal: Negative for nausea, vomiting and abdominal pain.       Food getting stuck in  esophagus  Psychiatric/Behavioral: Negative.   All other systems reviewed and are negative.       Objective:   Physical Exam  Constitutional: He is oriented to person, place, and time. He appears well-developed and well-nourished. No distress.  Cardiovascular: Normal rate and regular rhythm.   Pulmonary/Chest: Effort normal. No respiratory distress.  Musculoskeletal: Normal range of motion.  Neurological: He is alert and oriented to person, place, and time.  Psychiatric: He has a normal mood and affect. His behavior is normal. Judgment and thought content normal.          Assessment & Plan:

## 2013-05-04 NOTE — Assessment & Plan Note (Signed)
Spasms occuring more frequently. Hx of GERD and hiatal hernia. Refer to GI

## 2013-05-04 NOTE — Progress Notes (Signed)
Pre visit review using our clinic review tool, if applicable. No additional management support is needed unless otherwise documented below in the visit note. 

## 2013-05-25 ENCOUNTER — Encounter: Payer: Self-pay | Admitting: Internal Medicine

## 2013-06-04 ENCOUNTER — Ambulatory Visit (INDEPENDENT_AMBULATORY_CARE_PROVIDER_SITE_OTHER): Payer: Medicare PPO | Admitting: Internal Medicine

## 2013-06-04 ENCOUNTER — Encounter: Payer: Self-pay | Admitting: Internal Medicine

## 2013-06-04 VITALS — BP 104/70 | HR 78 | Temp 97.8°F | Wt 213.0 lb

## 2013-06-04 DIAGNOSIS — Z23 Encounter for immunization: Secondary | ICD-10-CM

## 2013-06-04 DIAGNOSIS — R05 Cough: Secondary | ICD-10-CM

## 2013-06-04 DIAGNOSIS — Z125 Encounter for screening for malignant neoplasm of prostate: Secondary | ICD-10-CM

## 2013-06-04 DIAGNOSIS — R059 Cough, unspecified: Secondary | ICD-10-CM | POA: Insufficient documentation

## 2013-06-04 DIAGNOSIS — R61 Generalized hyperhidrosis: Secondary | ICD-10-CM

## 2013-06-04 DIAGNOSIS — I10 Essential (primary) hypertension: Secondary | ICD-10-CM

## 2013-06-04 DIAGNOSIS — K224 Dyskinesia of esophagus: Secondary | ICD-10-CM

## 2013-06-04 DIAGNOSIS — E785 Hyperlipidemia, unspecified: Secondary | ICD-10-CM

## 2013-06-04 LAB — CBC WITH DIFFERENTIAL/PLATELET
BASOS ABS: 0 10*3/uL (ref 0.0–0.1)
BASOS PCT: 0.6 % (ref 0.0–3.0)
EOS PCT: 3.4 % (ref 0.0–5.0)
Eosinophils Absolute: 0.2 10*3/uL (ref 0.0–0.7)
HCT: 50.9 % (ref 39.0–52.0)
HEMOGLOBIN: 16.7 g/dL (ref 13.0–17.0)
LYMPHS PCT: 29.2 % (ref 12.0–46.0)
Lymphs Abs: 2.1 10*3/uL (ref 0.7–4.0)
MCHC: 32.9 g/dL (ref 30.0–36.0)
MCV: 86.3 fl (ref 78.0–100.0)
MONOS PCT: 6 % (ref 3.0–12.0)
Monocytes Absolute: 0.4 10*3/uL (ref 0.1–1.0)
NEUTROS ABS: 4.4 10*3/uL (ref 1.4–7.7)
Neutrophils Relative %: 60.8 % (ref 43.0–77.0)
Platelets: 224 10*3/uL (ref 150.0–400.0)
RBC: 5.9 Mil/uL — AB (ref 4.22–5.81)
RDW: 14.2 % (ref 11.5–14.6)
WBC: 7.3 10*3/uL (ref 4.5–10.5)

## 2013-06-04 LAB — MICROALBUMIN / CREATININE URINE RATIO
Creatinine,U: 280.6 mg/dL
Microalb Creat Ratio: 0.2 mg/g (ref 0.0–30.0)
Microalb, Ur: 0.7 mg/dL (ref 0.0–1.9)

## 2013-06-04 LAB — COMPREHENSIVE METABOLIC PANEL
ALT: 37 U/L (ref 0–53)
AST: 28 U/L (ref 0–37)
Albumin: 3.9 g/dL (ref 3.5–5.2)
Alkaline Phosphatase: 57 U/L (ref 39–117)
BILIRUBIN TOTAL: 1 mg/dL (ref 0.3–1.2)
BUN: 18 mg/dL (ref 6–23)
CALCIUM: 9.5 mg/dL (ref 8.4–10.5)
CHLORIDE: 106 meq/L (ref 96–112)
CO2: 25 meq/L (ref 19–32)
CREATININE: 1.1 mg/dL (ref 0.4–1.5)
GFR: 69.54 mL/min (ref >60.00)
GLUCOSE: 110 mg/dL — AB (ref 70–99)
Potassium: 4.3 mEq/L (ref 3.5–5.1)
Sodium: 139 mEq/L (ref 135–145)
Total Protein: 6.4 g/dL (ref 6.0–8.3)

## 2013-06-04 LAB — LIPID PANEL
Cholesterol: 177 mg/dL (ref 0–200)
HDL: 39.9 mg/dL (ref >39.00)
LDL CALC: 112 mg/dL — AB (ref 0–99)
Total CHOL/HDL Ratio: 4
Triglycerides: 125 mg/dL (ref 0.0–149.0)
VLDL: 25 mg/dL (ref 0.0–40.0)

## 2013-06-04 LAB — PSA, MEDICARE: PSA: 0.24 ng/ml (ref 0.10–4.00)

## 2013-06-04 NOTE — Progress Notes (Signed)
Subjective:    Patient ID: Alexander Fitzgerald, male    DOB: [birth year], 68 y.o.   MRN: [id number]  HPI 68YO male with h/o hypertension presents for follow up. Generally, he is feeling well. He had an episode a few months ago, when visiting a friend for dinner in which he swallowed some mashed potatoes and corn and it became lodged in his lower esophagus/stomach, causing cramping and vomiting. Symptoms resolved after an hour or so with no other intervention. No recurrent symptoms since that time. Scheduled for barium swallow and upper endoscopy later this week.  Also notes some occasional cough over last couple of days. No fever, chills. Occasional diaphoresis at night, but no drenching night sweats. Grandchildren have been ill with flu.  Review of Systems  Constitutional: Negative for fever, chills, activity change, appetite change, fatigue and unexpected weight change.  Eyes: Negative for visual disturbance.  Respiratory: Negative for cough and shortness of breath.   Cardiovascular: Negative for chest pain, palpitations and leg swelling.  Gastrointestinal: Negative for nausea, abdominal pain, diarrhea, constipation, abdominal distention and rectal pain.  Genitourinary: Negative for dysuria, urgency and difficulty urinating.  Musculoskeletal: Negative for arthralgias and gait problem.  Skin: Negative for color change and rash.  Hematological: Negative for adenopathy.  Psychiatric/Behavioral: Negative for sleep disturbance and dysphoric mood. The patient is not nervous/anxious.        Objective:    BP 104/70  Pulse 78  Temp(Src) 97.8 F (36.6 C) (Oral)  Wt 213 lb (96.616 kg)  SpO2 95% Physical Exam  Constitutional: He is oriented to person, place, and time. He appears well-developed and well-nourished. No distress.  HENT:  Head: Normocephalic and atraumatic.  Right Ear: External ear normal.  Left Ear: External ear normal.  Nose: Nose normal.  Mouth/Throat: Oropharynx is clear  and moist. No oropharyngeal exudate.  Eyes: Conjunctivae and EOM are normal. Pupils are equal, round, and reactive to light. Right eye exhibits no discharge. Left eye exhibits no discharge. No scleral icterus.  Neck: Normal range of motion. Neck supple. No tracheal deviation present. No thyromegaly present.  Cardiovascular: Normal rate, regular rhythm and normal heart sounds.  Exam reveals no gallop and no friction rub.   No murmur heard. Pulmonary/Chest: Effort normal and breath sounds normal. No accessory muscle usage. Not tachypneic. No respiratory distress. He has no decreased breath sounds. He has no wheezes. He has no rhonchi. He has no rales. He exhibits no tenderness.  Musculoskeletal: Normal range of motion. He exhibits no edema.  Lymphadenopathy:    He has no cervical adenopathy.  Neurological: He is alert and oriented to person, place, and time. No cranial nerve deficit. Coordination normal.  Skin: Skin is warm and dry. No rash noted. He is not diaphoretic. No erythema. No pallor.  Psychiatric: He has a normal mood and affect. His behavior is normal. Judgment and thought content normal.          Assessment & Plan:   Problem List Items Addressed This Visit   Esophageal spasm     Symptoms of intermittent esophageal spasm. Barium swallow and upper endoscopy scheduled for further evaluation. Will follow.    Hypertension - Primary      BP Readings from Last 3 Encounters:  06/04/13 104/70  05/04/13 112/62  12/04/12 120/80   BP well controlled on Lisinopril. Will check renal function with labs today.    Relevant Orders      Comprehensive metabolic panel      Microalbumin / creatinine  urine ratio   Screening for prostate cancer     Will send PSA testing with labs today. Discussed the potential benefits and limitations of PSA testing.    Relevant Orders      PSA, Medicare    Other Visit Diagnoses   Other and unspecified hyperlipidemia        Relevant Orders       Lipid  panel    Diaphoresis        Relevant Orders       CBC with Differential    Need for prophylactic vaccination against Streptococcus pneumoniae (pneumococcus)        Relevant Orders       Pneumococcal conjugate vaccine 13-valent (Completed)        Return in about 6 months (around 12/02/2013) for Wellness Visit.

## 2013-06-04 NOTE — Progress Notes (Signed)
Pre-visit discussion using our clinic review tool. No additional management support is needed unless otherwise documented below in the visit note.  

## 2013-06-04 NOTE — Assessment & Plan Note (Signed)
BP Readings from Last 3 Encounters:  06/04/13 104/70  05/04/13 112/62  12/04/12 120/80   BP well controlled on Lisinopril. Will check renal function with labs today.

## 2013-06-04 NOTE — Assessment & Plan Note (Signed)
Symptoms of intermittent esophageal spasm. Barium swallow and upper endoscopy scheduled for further evaluation. Will follow.

## 2013-06-04 NOTE — Assessment & Plan Note (Signed)
Two days of occasional dry cough and some episodes of diaphoresis at bedtime. Most likely viral infection. Rapid flu negative today. Encouraged rest, adequate fluids, prn antihistamines, DM. Follow up if symptoms not improving.

## 2013-06-04 NOTE — Assessment & Plan Note (Signed)
Will send PSA testing with labs today. Discussed the potential benefits and limitations of PSA testing.

## 2013-06-05 ENCOUNTER — Encounter: Payer: Self-pay | Admitting: Internal Medicine

## 2013-06-06 ENCOUNTER — Telehealth: Payer: Self-pay | Admitting: Internal Medicine

## 2013-06-06 NOTE — Telephone Encounter (Signed)
Relevant patient education assigned to patient using Emmi. ° °

## 2013-06-07 ENCOUNTER — Ambulatory Visit: Payer: Self-pay | Admitting: Gastroenterology

## 2013-06-12 ENCOUNTER — Ambulatory Visit: Payer: Self-pay | Admitting: Gastroenterology

## 2013-06-12 ENCOUNTER — Encounter: Payer: Self-pay | Admitting: Internal Medicine

## 2013-06-20 ENCOUNTER — Encounter: Payer: Self-pay | Admitting: Internal Medicine

## 2013-06-20 ENCOUNTER — Encounter: Payer: Self-pay | Admitting: Adult Health

## 2013-06-25 LAB — PATHOLOGY REPORT

## 2013-07-11 ENCOUNTER — Ambulatory Visit: Payer: Self-pay

## 2013-07-13 ENCOUNTER — Encounter: Payer: Self-pay | Admitting: Internal Medicine

## 2013-07-16 ENCOUNTER — Ambulatory Visit: Payer: Self-pay

## 2013-07-16 ENCOUNTER — Encounter: Payer: Self-pay | Admitting: Internal Medicine

## 2013-07-19 ENCOUNTER — Telehealth: Payer: Self-pay | Admitting: Internal Medicine

## 2013-07-19 NOTE — Telephone Encounter (Signed)
Fwd to Dr. Walker 

## 2013-07-19 NOTE — Telephone Encounter (Signed)
Or [phone]  They are at surgeon's office, Dr. Martin Majestic.  States sleep test results were faxed to [place] (received and placed in Dr. Thomes Dinning box).  States we need to contact [employer] to get ball rolling for pt to receive apparatus.  States he has to have this and wear within 10 days of having surgery which is in 10-12 days.

## 2013-07-19 NOTE — Telephone Encounter (Signed)
I do not have the sleep study results.  Once we have them, fine to call in Rx for CPAP. Will need to figure out which medical supply company he wants to get equipment from.

## 2013-07-25 NOTE — Telephone Encounter (Signed)
Prescription has been written and faxed per [employer].

## 2013-07-26 ENCOUNTER — Other Ambulatory Visit: Payer: Self-pay | Admitting: Internal Medicine

## 2013-07-30 ENCOUNTER — Telehealth: Payer: Self-pay | Admitting: *Deleted

## 2013-07-30 NOTE — Telephone Encounter (Signed)
We received results last week. They are likely in process of being scanned. Can we request another copy and then send to [facility]?

## 2013-07-30 NOTE — Telephone Encounter (Signed)
[employer] from [employer] left a message stating they need a copy of patient baseline sleepy study faxed to them at [phone]. Have you seen the results?

## 2013-08-02 NOTE — Telephone Encounter (Signed)
Yes, I think sleep med

## 2013-08-02 NOTE — Telephone Encounter (Signed)
Where was the Sleep Study performed? Sleep Med?

## 2013-08-03 NOTE — Telephone Encounter (Signed)
Sleep Med results have been faxed to [facility] at [phone].

## 2013-08-03 NOTE — Telephone Encounter (Signed)
Called and spoke with pt wife. Pt is scheduled for preop Monday and surgery Tuesday. Has CPAP and has been using for 2 days. They will call or email with update.

## 2013-08-20 ENCOUNTER — Telehealth: Payer: Self-pay | Admitting: Internal Medicine

## 2013-08-20 MED ORDER — FLUTICASONE-SALMETEROL 250-50 MCG/DOSE IN AEPB: 1.0000 | INHALATION_SPRAY | RESPIRATORY_TRACT | Status: DC | PRN

## 2013-08-20 NOTE — Telephone Encounter (Signed)
Left message to clarify, med list states uses albuterol rescue inhaler, message says ipratropium?

## 2013-08-20 NOTE — Telephone Encounter (Signed)
Pt came in to office requesting refill of inhaler ipratropium rescue inhaler.  Also needs Advair inhaler.  [facility] [address]

## 2013-08-22 ENCOUNTER — Ambulatory Visit (INDEPENDENT_AMBULATORY_CARE_PROVIDER_SITE_OTHER): Payer: Medicare PPO | Admitting: Internal Medicine

## 2013-08-22 ENCOUNTER — Encounter: Payer: Self-pay | Admitting: Internal Medicine

## 2013-08-22 ENCOUNTER — Telehealth: Payer: Self-pay | Admitting: Internal Medicine

## 2013-08-22 ENCOUNTER — Ambulatory Visit: Payer: Self-pay | Admitting: Internal Medicine

## 2013-08-22 VITALS — BP 110/70 | HR 80 | Temp 98.1°F | Ht 69.5 in | Wt 197.8 lb

## 2013-08-22 DIAGNOSIS — R0689 Other abnormalities of breathing: Principal | ICD-10-CM

## 2013-08-22 DIAGNOSIS — R059 Cough, unspecified: Secondary | ICD-10-CM

## 2013-08-22 DIAGNOSIS — R0609 Other forms of dyspnea: Secondary | ICD-10-CM

## 2013-08-22 DIAGNOSIS — R05 Cough: Secondary | ICD-10-CM

## 2013-08-22 DIAGNOSIS — R0989 Other specified symptoms and signs involving the circulatory and respiratory systems: Secondary | ICD-10-CM

## 2013-08-22 DIAGNOSIS — R0600 Dyspnea, unspecified: Secondary | ICD-10-CM | POA: Insufficient documentation

## 2013-08-22 MED ORDER — ALBUTEROL SULFATE HFA 108 (90 BASE) MCG/ACT IN AERS: 2.0000 | INHALATION_SPRAY | Freq: Four times a day (QID) | RESPIRATORY_TRACT | Status: DC | PRN

## 2013-08-22 MED ORDER — PREDNISONE 10 MG PO TABS: ORAL_TABLET | ORAL | Status: DC

## 2013-08-22 NOTE — Telephone Encounter (Signed)
Left message for pt to return my call.

## 2013-08-22 NOTE — Assessment & Plan Note (Signed)
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 Exam is normal with no wheezing or other findings to suggest asthma.  Discussed with nurse from pt's surgeon's office who said that these symptoms are typical after this surgery. CT chest today showed no abnormalities such as PE, pneumomediastinum. Will continue to monitor for now. Encouraged continued use of pureed foods, avoidance of overuse of his voice for now. Follow up Friday and prn.

## 2013-08-22 NOTE — Telephone Encounter (Addendum)
Error

## 2013-08-22 NOTE — Telephone Encounter (Signed)
Spoke with pt, meant albuterol inhaler, not ipratropium. Rx sent to pharmacy by escript

## 2013-08-22 NOTE — Progress Notes (Signed)
Pre visit review using our clinic review tool, if applicable. No additional management support is needed unless otherwise documented below in the visit note. 

## 2013-08-22 NOTE — Progress Notes (Signed)
Subjective:    Patient ID: Alexander Fitzgerald, male    DOB: 1946-02-09, 68 y.o.   MRN: [id number]  HPI 68YO male presents for acute visit.  Recently had paraesophageal hernia repair at [facility] 2 weeks ago. Tolerated well. Continues on pureed diet. No nausea, vomiting. No pain.  Noticed "irritation" of asthma with use of CPAP over last couple weeks. Becoming more persistent with dyspnea throughout day. Using Albuterol every 3 hr and Advair with no improvement. No productive cough. No fever. No chest pain.  Review of Systems  Constitutional: Negative for fever, chills, activity change, appetite change, fatigue and unexpected weight change.  Eyes: Negative for visual disturbance.  Respiratory: Positive for cough, chest tightness and shortness of breath.   Cardiovascular: Negative for chest pain, palpitations and leg swelling.  Gastrointestinal: Negative for abdominal pain and abdominal distention.  Genitourinary: Negative for dysuria, urgency and difficulty urinating.  Musculoskeletal: Negative for arthralgias and gait problem.  Skin: Negative for color change and rash.  Hematological: Negative for adenopathy.  Psychiatric/Behavioral: Negative for sleep disturbance and dysphoric mood. The patient is not nervous/anxious.        Objective:    BP 110/70  Pulse 80  Temp(Src) 98.1 F (36.7 C) (Oral)  Ht 5' 9.5" (1.765 m)  Wt 197 lb 12 oz (89.699 kg)  BMI 28.79 kg/m2  SpO2 96% Physical Exam  Constitutional: He is oriented to person, place, and time. He appears well-developed and well-nourished. No distress.  HENT:  Head: Normocephalic and atraumatic.  Right Ear: External ear normal.  Left Ear: External ear normal.  Nose: Nose normal.  Mouth/Throat: Oropharynx is clear and moist. No oropharyngeal exudate.  Eyes: Conjunctivae and EOM are normal. Pupils are equal, round, and reactive to light. Right eye exhibits no discharge. Left eye exhibits no discharge. No scleral icterus.  Neck:  Normal range of motion. Neck supple. No tracheal deviation present. No thyromegaly present.  Cardiovascular: Normal rate, regular rhythm and normal heart sounds.  Exam reveals no gallop and no friction rub.   No murmur heard. Pulmonary/Chest: Effort normal. No accessory muscle usage. Not tachypneic. No respiratory distress. He has no decreased breath sounds. He has no wheezes. He has no rhonchi. He has no rales. He exhibits no tenderness.  Musculoskeletal: Normal range of motion. He exhibits no edema.  Lymphadenopathy:    He has no cervical adenopathy.  Neurological: He is alert and oriented to person, place, and time. No cranial nerve deficit. Coordination normal.  Skin: Skin is warm and dry. No rash noted. He is not diaphoretic. No erythema. No pallor.  Psychiatric: He has a normal mood and affect. His behavior is normal. Judgment and thought content normal.          Assessment & Plan:  Over 28min of which >50% spent in face-to-face contact with patient discussing plan of care  Problem List Items Addressed This Visit   Cough   Relevant Orders      CT Chest W Contrast   Dyspnea and respiratory abnormality - Primary     Symptoms of dyspnea and cough after recent repair of paraesophageal hernia. Exam is normal with no wheezing or other findings to suggest asthma.  Discussed with nurse from pt's surgeon's office who said that these symptoms are typical after this surgery. CT chest today showed no abnormalities such as PE, pneumomediastinum. Will continue to monitor for now. Encouraged continued use of pureed foods, avoidance of overuse of his voice for now. Follow up Friday and prn.  Relevant Orders      CT Chest W Contrast       Return in about 2 days (around 08/24/2013).

## 2013-08-23 NOTE — Telephone Encounter (Signed)
Yes. This was entered incorrectly. Please disregard.

## 2013-08-23 NOTE — Telephone Encounter (Signed)
I think this may be on the wrong patient??

## 2013-09-19 ENCOUNTER — Encounter: Payer: Self-pay | Admitting: Internal Medicine

## 2013-12-07 ENCOUNTER — Ambulatory Visit (INDEPENDENT_AMBULATORY_CARE_PROVIDER_SITE_OTHER): Payer: Medicare PPO | Admitting: Internal Medicine

## 2013-12-07 DIAGNOSIS — Z0000 Encounter for general adult medical examination without abnormal findings: Secondary | ICD-10-CM

## 2013-12-07 NOTE — Progress Notes (Signed)
   Subjective:    Patient ID: Alexander Fitzgerald, male    DOB: 1945-08-06, 68 y.o.   MRN: [id number]  HPI No show  Review of Systems     Objective:    There were no vitals taken for this visit. Physical Exam        Assessment & Plan:   Problem List Items Addressed This Visit   None       No Follow-up on file.

## 2014-01-16 ENCOUNTER — Ambulatory Visit: Payer: Medicare PPO

## 2014-01-16 ENCOUNTER — Ambulatory Visit (INDEPENDENT_AMBULATORY_CARE_PROVIDER_SITE_OTHER): Payer: Medicare PPO

## 2014-01-16 DIAGNOSIS — Z23 Encounter for immunization: Secondary | ICD-10-CM

## 2014-01-20 ENCOUNTER — Other Ambulatory Visit: Payer: Self-pay | Admitting: Internal Medicine

## 2014-02-01 ENCOUNTER — Other Ambulatory Visit: Payer: Self-pay | Admitting: Internal Medicine

## 2014-03-04 ENCOUNTER — Other Ambulatory Visit: Payer: Self-pay | Admitting: Internal Medicine

## 2014-04-15 ENCOUNTER — Encounter: Payer: Self-pay | Admitting: Internal Medicine

## 2014-04-15 ENCOUNTER — Ambulatory Visit (INDEPENDENT_AMBULATORY_CARE_PROVIDER_SITE_OTHER): Payer: Medicare PPO | Admitting: Internal Medicine

## 2014-04-15 VITALS — BP 96/68 | HR 85 | Temp 97.7°F | Ht 69.0 in | Wt 211.8 lb

## 2014-04-15 DIAGNOSIS — Z0000 Encounter for general adult medical examination without abnormal findings: Secondary | ICD-10-CM

## 2014-04-15 DIAGNOSIS — Z125 Encounter for screening for malignant neoplasm of prostate: Secondary | ICD-10-CM

## 2014-04-15 DIAGNOSIS — Z1211 Encounter for screening for malignant neoplasm of colon: Secondary | ICD-10-CM

## 2014-04-15 DIAGNOSIS — E669 Obesity, unspecified: Secondary | ICD-10-CM | POA: Insufficient documentation

## 2014-04-15 DIAGNOSIS — I10 Essential (primary) hypertension: Secondary | ICD-10-CM

## 2014-04-15 DIAGNOSIS — Z9989 Dependence on other enabling machines and devices: Secondary | ICD-10-CM

## 2014-04-15 DIAGNOSIS — G4733 Obstructive sleep apnea (adult) (pediatric): Secondary | ICD-10-CM | POA: Insufficient documentation

## 2014-04-15 LAB — LIPID PANEL
CHOL/HDL RATIO: 3
Cholesterol: 180 mg/dL (ref 0–200)
HDL: 51.7 mg/dL (ref >39.00)
LDL CALC: 99 mg/dL (ref 0–99)
NONHDL: 128.3
Triglycerides: 145 mg/dL (ref 0.0–149.0)
VLDL: 29 mg/dL (ref 0.0–40.0)

## 2014-04-15 LAB — COMPREHENSIVE METABOLIC PANEL
ALBUMIN: 4.3 g/dL (ref 3.5–5.2)
ALT: 27 U/L (ref 0–53)
AST: 25 U/L (ref 0–37)
Alkaline Phosphatase: 64 U/L (ref 39–117)
BUN: 16 mg/dL (ref 6–23)
CALCIUM: 9.6 mg/dL (ref 8.4–10.5)
CHLORIDE: 105 meq/L (ref 96–112)
CO2: 28 meq/L (ref 19–32)
Creatinine, Ser: 1 mg/dL (ref 0.4–1.5)
GFR: 75.56 mL/min (ref >60.00)
Glucose, Bld: 87 mg/dL (ref 70–99)
POTASSIUM: 4.7 meq/L (ref 3.5–5.1)
Sodium: 139 mEq/L (ref 135–145)
Total Bilirubin: 1.1 mg/dL (ref 0.2–1.2)
Total Protein: 6.9 g/dL (ref 6.0–8.3)

## 2014-04-15 LAB — CBC WITH DIFFERENTIAL/PLATELET
BASOS ABS: 0 10*3/uL (ref 0.0–0.1)
Basophils Relative: 0.5 % (ref 0.0–3.0)
EOS ABS: 0.2 10*3/uL (ref 0.0–0.7)
Eosinophils Relative: 2 % (ref 0.0–5.0)
HCT: 52.7 % — ABNORMAL HIGH (ref 39.0–52.0)
Hemoglobin: 17.2 g/dL — ABNORMAL HIGH (ref 13.0–17.0)
LYMPHS ABS: 2.1 10*3/uL (ref 0.7–4.0)
Lymphocytes Relative: 24.4 % (ref 12.0–46.0)
MCHC: 32.7 g/dL (ref 30.0–36.0)
MCV: 87.8 fl (ref 78.0–100.0)
Monocytes Absolute: 0.5 10*3/uL (ref 0.1–1.0)
Monocytes Relative: 5.5 % (ref 3.0–12.0)
NEUTROS PCT: 67.6 % (ref 43.0–77.0)
Neutro Abs: 5.9 10*3/uL (ref 1.4–7.7)
Platelets: 255 10*3/uL (ref 150.0–400.0)
RBC: 5.99 Mil/uL — AB (ref 4.22–5.81)
RDW: 14.6 % (ref 11.5–15.5)
WBC: 8.7 10*3/uL (ref 4.0–10.5)

## 2014-04-15 LAB — MICROALBUMIN / CREATININE URINE RATIO
Creatinine,U: 151.4 mg/dL
MICROALB/CREAT RATIO: 0.5 mg/g (ref 0.0–30.0)
Microalb, Ur: 0.8 mg/dL (ref 0.0–1.9)

## 2014-04-15 LAB — PSA, MEDICARE: PSA: 0.34 ng/mL (ref 0.10–4.00)

## 2014-04-15 NOTE — Assessment & Plan Note (Signed)
Wt Readings from Last 3 Encounters:  04/15/14 211 lb 12 oz (96.049 kg)  08/22/13 197 lb 12 oz (89.699 kg)  06/04/13 213 lb (96.616 kg)   Body mass index is 31.26 kg/(m^2). Encouraged continued effort at healthy diet and exercise.

## 2014-04-15 NOTE — Assessment & Plan Note (Signed)
Compliant with CPAP tolerating well. Will continue.

## 2014-04-15 NOTE — Progress Notes (Deleted)
   Subjective:    Patient ID: Alexander Fitzgerald, male    DOB: 1946/01/01, 68 y.o.   MRN: [id number]  HPI    Past medical, surgical, family and social history per today's encounter.  Review of Systems     Objective:    There were no vitals taken for this visit. Physical Exam        Assessment & Plan:   Problem List Items Addressed This Visit      Unprioritized   Medicare annual wellness visit, subsequent - Primary       No Follow-up on file.

## 2014-04-15 NOTE — Assessment & Plan Note (Signed)
BP Readings from Last 3 Encounters:  04/15/14 96/68  08/22/13 110/70  06/04/13 104/70   BP low today, including on recheck, however pt feeling well. Will continue Lisinopril and have him monitor BP at home and email with readings. Renal function with labs today.

## 2014-04-15 NOTE — Assessment & Plan Note (Signed)
General medical exam normal today. Will check labs including CBC, CMP, lipids, PSA. Immunizations are UTD. Referral for colonoscopy placed. Encouraged healthy diet and exercise.

## 2014-04-15 NOTE — Progress Notes (Signed)
Pre visit review using our clinic review tool, if applicable. No additional management support is needed unless otherwise documented below in the visit note. 

## 2014-04-15 NOTE — Progress Notes (Signed)
The patient is here for annual Medicare Wellness Examination and management of other chronic and acute problems.   The risk factors are reflected in the history.  The roster of all physicians providing medical care to patient - is listed in the Snapshot section of the chart.  Activities of daily living:   The patient is 100% independent in all ADLs: dressing, toileting, feeding as well as independent mobility. Patient lives with wife and has 1 dog and 1 cat. Has a 3 story home with both hard floors and carpeted floors.  Home safety :  The patient has smoke detectors and CO detectors in the home.  They wear seatbelts in their car. There are no firearms at home.  There is no violence in the home. They feel safe where they live.  Infectious Risks: There is no risks for hepatitis, STDs or HIV.  There is no  history of blood transfusion.  They have no travel history to infectious disease endemic areas of the world.  Additional Health Care Providers: The patient has seen their dentist in the last six months. Dentist - Dr. Bronson Curb They have seen their eye doctor in the last year. [facility] They deny hearing issues. They have deferred audiologic testing in the last year.   They do not  have excessive sun exposure. Discussed the need for sun protection: hats,long sleeves and use of sunscreen if there is significant sun exposure.  Dermatologist - [place]  Diet: the importance of a healthy diet is discussed. They do have a healthy diet.  The benefits of regular aerobic exercise were discussed. Patient exercises by walking several times per day.  Depression screen: there are no signs or vegative symptoms of depression- irritability, change in appetite, anhedonia, sadness/tearfullness.  Cognitive assessment: the patient manages all their financial and personal affairs and is actively engaged. They could relate day,date,year and events.  HCPOA -  Immunologist, daughter  The following portions of the patient's history were reviewed and updated as appropriate: allergies, current medications, past family history, past medical history,  past surgical history, past social history and problem list.  Visual acuity was not assessed per patient preference as they have regular follow up with their ophthalmologist. Hearing and body mass index were assessed and reviewed.   During the course of the visit the patient was educated and counseled about appropriate screening and preventive services including : fall prevention , diabetes screening, nutrition counseling, colorectal cancer screening, and recommended immunizations.    Notes some decreased stamina after paraesophageal hernia repair. Just released for exercise in Sept.  Review of Systems  Constitutional: Negative for fever, chills, activity change, appetite change, fatigue and unexpected weight change.  Eyes: Negative for visual disturbance.  Respiratory: Negative for cough and shortness of breath.   Cardiovascular: Negative for chest pain, palpitations and leg swelling.  Gastrointestinal: Negative for nausea, vomiting, abdominal pain, diarrhea, constipation and abdominal distention.  Genitourinary: Negative for dysuria, urgency and difficulty urinating.  Musculoskeletal: Negative for arthralgias and gait problem.  Skin: Negative for color change and rash.  Hematological: Negative for adenopathy.  Psychiatric/Behavioral: Negative for sleep disturbance and dysphoric mood. The patient is not nervous/anxious.        Objective:    BP 96/68 mmHg  Pulse 85  Temp(Src) 97.7 F (36.5 C) (Oral)  Ht 5\' 9"  (1.753 m)  Wt 211 lb 12 oz (96.049 kg)  BMI 31.26 kg/m2  SpO2 94% Physical Exam  Constitutional: He is  oriented to person, place, and time. He appears well-developed and well-nourished. No distress.  HENT:  Head: Normocephalic and atraumatic.  Right Ear: External ear normal.  Left Ear:  External ear normal.  Nose: Nose normal.  Mouth/Throat: Oropharynx is clear and moist. No oropharyngeal exudate.  Eyes: Conjunctivae and EOM are normal. Pupils are equal, round, and reactive to light. Right eye exhibits no discharge. Left eye exhibits no discharge. No scleral icterus.  Neck: Normal range of motion. Neck supple. No tracheal deviation present. No thyromegaly present.  Cardiovascular: Normal rate, regular rhythm and normal heart sounds.  Exam reveals no gallop and no friction rub.   No murmur heard. Pulmonary/Chest: Effort normal and breath sounds normal. No respiratory distress. He has no wheezes. He has no rales. He exhibits no tenderness.  Abdominal: Soft. Bowel sounds are normal. He exhibits no distension and no mass. There is no tenderness. There is no rebound and no guarding.  Musculoskeletal: Normal range of motion. He exhibits no edema.  Lymphadenopathy:    He has no cervical adenopathy.  Neurological: He is alert and oriented to person, place, and time. No cranial nerve deficit. Coordination normal.  Skin: Skin is warm and dry. No rash noted. He is not diaphoretic. No erythema. No pallor.  Psychiatric: He has a normal mood and affect. His behavior is normal. Judgment and thought content normal.          Assessment & Plan:   Problem List Items Addressed This Visit      Unprioritized   Hypertension    BP Readings from Last 3 Encounters:  04/15/14 96/68  08/22/13 110/70  06/04/13 104/70   BP low today, including on recheck, however pt feeling well. Will continue Lisinopril and have him monitor BP at home and email with readings. Renal function with labs today.    Medicare annual wellness visit, subsequent - Primary    General medical exam normal today. Will check labs including CBC, CMP, lipids, PSA. Immunizations are UTD. Referral for colonoscopy placed. Encouraged healthy diet and exercise.    Relevant Orders      CBC with Differential      Comprehensive  metabolic panel      Lipid panel      Microalbumin / creatinine urine ratio      PSA, Medicare   Obesity (BMI 30-39.9)    Wt Readings from Last 3 Encounters:  04/15/14 211 lb 12 oz (96.049 kg)  08/22/13 197 lb 12 oz (89.699 kg)  06/04/13 213 lb (96.616 kg)   Body mass index is 31.26 kg/(m^2). Encouraged continued effort at healthy diet and exercise.    OSA on CPAP    Compliant with CPAP tolerating well. Will continue.     Other Visit Diagnoses    Special screening for malignant neoplasms, colon        Relevant Orders       Ambulatory referral to Gastroenterology        Return in about 6 months (around 10/15/2014) for Recheck.

## 2014-04-15 NOTE — Patient Instructions (Signed)

## 2014-04-17 ENCOUNTER — Encounter: Payer: Self-pay | Admitting: *Deleted

## 2014-04-17 ENCOUNTER — Other Ambulatory Visit: Payer: Self-pay | Admitting: *Deleted

## 2014-04-17 DIAGNOSIS — D582 Other hemoglobinopathies: Secondary | ICD-10-CM

## 2014-05-03 ENCOUNTER — Other Ambulatory Visit (INDEPENDENT_AMBULATORY_CARE_PROVIDER_SITE_OTHER): Payer: Medicare PPO

## 2014-05-03 DIAGNOSIS — D582 Other hemoglobinopathies: Secondary | ICD-10-CM

## 2014-05-03 LAB — CBC WITH DIFFERENTIAL/PLATELET
BASOS ABS: 0 10*3/uL (ref 0.0–0.1)
Basophils Relative: 0.6 % (ref 0.0–3.0)
Eosinophils Absolute: 0.3 10*3/uL (ref 0.0–0.7)
Eosinophils Relative: 3.5 % (ref 0.0–5.0)
HCT: 50.5 % (ref 39.0–52.0)
HEMOGLOBIN: 16.8 g/dL (ref 13.0–17.0)
LYMPHS ABS: 2.6 10*3/uL (ref 0.7–4.0)
LYMPHS PCT: 35.1 % (ref 12.0–46.0)
MCHC: 33.3 g/dL (ref 30.0–36.0)
MCV: 86.5 fl (ref 78.0–100.0)
MONO ABS: 0.5 10*3/uL (ref 0.1–1.0)
Monocytes Relative: 6.7 % (ref 3.0–12.0)
NEUTROS ABS: 4 10*3/uL (ref 1.4–7.7)
Neutrophils Relative %: 54.1 % (ref 43.0–77.0)
PLATELETS: 232 10*3/uL (ref 150.0–400.0)
RBC: 5.84 Mil/uL — AB (ref 4.22–5.81)
RDW: 14.2 % (ref 11.5–15.5)
WBC: 7.3 10*3/uL (ref 4.0–10.5)

## 2014-05-11 ENCOUNTER — Other Ambulatory Visit: Payer: Self-pay | Admitting: Internal Medicine

## 2014-06-23 ENCOUNTER — Other Ambulatory Visit: Payer: Self-pay | Admitting: Internal Medicine

## 2014-07-21 ENCOUNTER — Other Ambulatory Visit: Payer: Self-pay | Admitting: Internal Medicine

## 2014-07-28 ENCOUNTER — Encounter: Payer: Self-pay | Admitting: Internal Medicine

## 2014-07-29 ENCOUNTER — Encounter: Payer: Self-pay | Admitting: Internal Medicine

## 2014-08-18 ENCOUNTER — Other Ambulatory Visit: Payer: Self-pay | Admitting: Internal Medicine

## 2014-10-05 ENCOUNTER — Other Ambulatory Visit: Payer: Self-pay | Admitting: Internal Medicine

## 2014-10-28 ENCOUNTER — Ambulatory Visit (INDEPENDENT_AMBULATORY_CARE_PROVIDER_SITE_OTHER): Payer: Medicare PPO | Admitting: Internal Medicine

## 2014-10-28 ENCOUNTER — Encounter: Payer: Self-pay | Admitting: Internal Medicine

## 2014-10-28 VITALS — BP 108/71 | HR 82 | Temp 98.3°F | Ht 69.0 in | Wt 214.2 lb

## 2014-10-28 DIAGNOSIS — E669 Obesity, unspecified: Secondary | ICD-10-CM | POA: Diagnosis not present

## 2014-10-28 DIAGNOSIS — I10 Essential (primary) hypertension: Secondary | ICD-10-CM

## 2014-10-28 DIAGNOSIS — G4733 Obstructive sleep apnea (adult) (pediatric): Secondary | ICD-10-CM | POA: Diagnosis not present

## 2014-10-28 DIAGNOSIS — Z9989 Dependence on other enabling machines and devices: Secondary | ICD-10-CM

## 2014-10-28 LAB — COMPREHENSIVE METABOLIC PANEL
ALBUMIN: 4.1 g/dL (ref 3.5–5.2)
ALT: 28 U/L (ref 0–53)
AST: 26 U/L (ref 0–37)
Alkaline Phosphatase: 67 U/L (ref 39–117)
BUN: 17 mg/dL (ref 6–23)
CO2: 29 meq/L (ref 19–32)
CREATININE: 1.05 mg/dL (ref 0.40–1.50)
Calcium: 9.5 mg/dL (ref 8.4–10.5)
Chloride: 104 mEq/L (ref 96–112)
GFR: 74.61 mL/min (ref >60.00)
GLUCOSE: 101 mg/dL — AB (ref 70–99)
Potassium: 4.2 mEq/L (ref 3.5–5.1)
Sodium: 138 mEq/L (ref 135–145)
Total Bilirubin: 0.7 mg/dL (ref 0.2–1.2)
Total Protein: 7 g/dL (ref 6.0–8.3)

## 2014-10-28 NOTE — Patient Instructions (Signed)
Labs today.  Follow up in 6 months. 

## 2014-10-28 NOTE — Progress Notes (Signed)
Subjective:    Patient ID: Alexander Fitzgerald, male    DOB: [birth year], 69 y.o.   MRN: [id number]  HPI  69YO male presents for follow up.  Feeling well. Trying to start back exercising by walking. Notes some intolerance to the high temps this summer. No persistent symptoms of dysphagia or GERD.   Past medical, surgical, family and social history per today's encounter.  Review of Systems  Constitutional: Negative for fever, chills, activity change, appetite change, fatigue and unexpected weight change.  Eyes: Negative for visual disturbance.  Respiratory: Negative for cough and shortness of breath.   Cardiovascular: Negative for chest pain, palpitations and leg swelling.  Gastrointestinal: Negative for nausea, vomiting, abdominal pain, diarrhea, constipation and abdominal distention.  Genitourinary: Negative for dysuria, urgency and difficulty urinating.  Musculoskeletal: Negative for arthralgias and gait problem.  Skin: Negative for color change and rash.  Hematological: Negative for adenopathy.  Psychiatric/Behavioral: Negative for sleep disturbance and dysphoric mood. The patient is not nervous/anxious.        Objective:    BP 108/71 mmHg  Pulse 82  Temp(Src) 98.3 F (36.8 C) (Oral)  Ht 5\' 9"  (1.753 m)  Wt 214 lb 4 oz (97.183 kg)  BMI 31.62 kg/m2  SpO2 94% Physical Exam  Constitutional: He is oriented to person, place, and time. He appears well-developed and well-nourished. No distress.  HENT:  Head: Normocephalic and atraumatic.  Right Ear: External ear normal.  Left Ear: External ear normal.  Nose: Nose normal.  Mouth/Throat: Oropharynx is clear and moist. No oropharyngeal exudate.  Eyes: Conjunctivae and EOM are normal. Pupils are equal, round, and reactive to light. Right eye exhibits no discharge. Left eye exhibits no discharge. No scleral icterus.  Neck: Normal range of motion. Neck supple. No tracheal deviation present. No thyromegaly present.    Cardiovascular: Normal rate, regular rhythm and normal heart sounds.  Exam reveals no gallop and no friction rub.   No murmur heard. Pulmonary/Chest: Effort normal and breath sounds normal. No accessory muscle usage. No tachypnea. No respiratory distress. He has no decreased breath sounds. He has no wheezes. He has no rhonchi. He has no rales. He exhibits no tenderness.  Musculoskeletal: Normal range of motion. He exhibits no edema.  Lymphadenopathy:    He has no cervical adenopathy.  Neurological: He is alert and oriented to person, place, and time. No cranial nerve deficit. Coordination normal.  Skin: Skin is warm and dry. No rash noted. He is not diaphoretic. No erythema. No pallor.  Psychiatric: He has a normal mood and affect. His behavior is normal. Judgment and thought content normal.          Assessment & Plan:   Problem List Items Addressed This Visit      Unprioritized   Hypertension - Primary    BP Readings from Last 3 Encounters:  10/28/14 108/71  04/15/14 96/68  08/22/13 110/70   BP well controlled. Renal function with labs today.      Relevant Orders   Comprehensive metabolic panel   Obesity (BMI 30-39.9)    Wt Readings from Last 3 Encounters:  10/28/14 214 lb 4 oz (97.183 kg)  04/15/14 211 lb 12 oz (96.049 kg)  08/22/13 197 lb 12 oz (89.699 kg)   Encouraged healthy diet and exercise as tolerated. Suggested walking in the mornings given the heat recently.      OSA on CPAP    Symptomatically, doing well. Continue CPAP.  Return in about 6 months (around 04/30/2015) for Wellness Visit.

## 2014-10-28 NOTE — Progress Notes (Signed)
Pre visit review using our clinic review tool, if applicable. No additional management support is needed unless otherwise documented below in the visit note. 

## 2014-10-28 NOTE — Assessment & Plan Note (Signed)
Symptomatically, doing well. Continue CPAP.

## 2014-10-28 NOTE — Assessment & Plan Note (Signed)
Wt Readings from Last 3 Encounters:  10/28/14 214 lb 4 oz (97.183 kg)  04/15/14 211 lb 12 oz (96.049 kg)  08/22/13 197 lb 12 oz (89.699 kg)   Encouraged healthy diet and exercise as tolerated. Suggested walking in the mornings given the heat recently.

## 2014-10-28 NOTE — Assessment & Plan Note (Signed)
BP Readings from Last 3 Encounters:  10/28/14 108/71  04/15/14 96/68  08/22/13 110/70   BP well controlled. Renal function with labs today.

## 2014-11-05 ENCOUNTER — Encounter: Payer: Self-pay | Admitting: Internal Medicine

## 2014-12-04 ENCOUNTER — Other Ambulatory Visit: Payer: Self-pay | Admitting: Internal Medicine

## 2014-12-27 ENCOUNTER — Ambulatory Visit: Payer: Medicare PPO | Admitting: Anesthesiology

## 2014-12-27 ENCOUNTER — Encounter: Payer: Self-pay | Admitting: Anesthesiology

## 2014-12-27 ENCOUNTER — Encounter
Admission: RE | Disposition: A | Discharge status: Home / Self Care | Payer: Self-pay | Source: Ambulatory Visit | Attending: Gastroenterology

## 2014-12-27 ENCOUNTER — Ambulatory Visit
Admission: RE | Admit: 2014-12-27 | Discharge: 2014-12-27 | Disposition: A | Discharge status: Home / Self Care | Payer: Medicare PPO | Source: Ambulatory Visit | Attending: Gastroenterology | Admitting: Gastroenterology

## 2014-12-27 DIAGNOSIS — M109 Gout, unspecified: Secondary | ICD-10-CM | POA: Diagnosis not present

## 2014-12-27 DIAGNOSIS — K5730 Diverticulosis of large intestine without perforation or abscess without bleeding: Secondary | ICD-10-CM | POA: Diagnosis not present

## 2014-12-27 DIAGNOSIS — E785 Hyperlipidemia, unspecified: Secondary | ICD-10-CM | POA: Insufficient documentation

## 2014-12-27 DIAGNOSIS — Z79899 Other long term (current) drug therapy: Secondary | ICD-10-CM | POA: Insufficient documentation

## 2014-12-27 DIAGNOSIS — Z87891 Personal history of nicotine dependence: Secondary | ICD-10-CM | POA: Diagnosis not present

## 2014-12-27 DIAGNOSIS — G709 Myoneural disorder, unspecified: Secondary | ICD-10-CM | POA: Diagnosis not present

## 2014-12-27 DIAGNOSIS — K219 Gastro-esophageal reflux disease without esophagitis: Secondary | ICD-10-CM | POA: Diagnosis not present

## 2014-12-27 DIAGNOSIS — F329 Major depressive disorder, single episode, unspecified: Secondary | ICD-10-CM | POA: Diagnosis not present

## 2014-12-27 DIAGNOSIS — I10 Essential (primary) hypertension: Secondary | ICD-10-CM | POA: Insufficient documentation

## 2014-12-27 DIAGNOSIS — D125 Benign neoplasm of sigmoid colon: Secondary | ICD-10-CM | POA: Insufficient documentation

## 2014-12-27 DIAGNOSIS — J45909 Unspecified asthma, uncomplicated: Secondary | ICD-10-CM | POA: Insufficient documentation

## 2014-12-27 DIAGNOSIS — Z7982 Long term (current) use of aspirin: Secondary | ICD-10-CM | POA: Insufficient documentation

## 2014-12-27 DIAGNOSIS — Z86010 Personal history of colonic polyps: Secondary | ICD-10-CM | POA: Diagnosis present

## 2014-12-27 HISTORY — PX: COLONOSCOPY: SHX5424

## 2014-12-27 SURGERY — COLONOSCOPY
Anesthesia: General

## 2014-12-27 MED ORDER — PROPOFOL INFUSION 10 MG/ML OPTIME
INTRAVENOUS | Status: DC | PRN
  Administered 2014-12-27: 120 ug/kg/min via INTRAVENOUS

## 2014-12-27 MED ORDER — SODIUM CHLORIDE 0.9 % IV SOLN
INTRAVENOUS | Status: DC
  Administered 2014-12-27: 1000 mL via INTRAVENOUS

## 2014-12-27 MED ORDER — LIDOCAINE HCL (CARDIAC) 20 MG/ML IV SOLN
INTRAVENOUS | Status: DC | PRN
  Administered 2014-12-27: 100 mg via INTRAVENOUS

## 2014-12-27 MED ORDER — PROPOFOL 10 MG/ML IV BOLUS
INTRAVENOUS | Status: DC | PRN
  Administered 2014-12-27: 90 mg via INTRAVENOUS

## 2014-12-27 MED ORDER — SODIUM CHLORIDE 0.9 % IV SOLN: INTRAVENOUS | Status: DC

## 2014-12-27 MED ORDER — PHENYLEPHRINE HCL 10 MG/ML IJ SOLN
INTRAMUSCULAR | Status: DC | PRN
  Administered 2014-12-27: 100 ug via INTRAVENOUS

## 2014-12-27 NOTE — Anesthesia Preprocedure Evaluation (Signed)
Anesthesia Evaluation  Patient identified by MRN, date of birth, ID band Patient awake    Reviewed: Allergy & Precautions, H&P , NPO status , [name] Chart, lab work & pertinent test results  History of Anesthesia Complications Negative for: history of anesthetic complications  Airway Mallampati: III  TM Distance: >3 FB Neck ROM: limited    Dental  (+) Poor Dentition   Pulmonary neg shortness of breath, asthma , sleep apnea and Continuous Positive Airway Pressure Ventilation , former smoker,    Pulmonary exam normal breath sounds clear to auscultation       Cardiovascular Exercise Tolerance: Good hypertension, (-) Past MI Normal cardiovascular exam+ dysrhythmias  Rhythm:regular Rate:Normal     Neuro/Psych PSYCHIATRIC DISORDERS Depression  Neuromuscular disease    GI/Hepatic Neg liver ROS, hiatal hernia, neg GERD  ,  Endo/Other  negative endocrine ROS  Renal/GU negative Renal ROS  negative genitourinary   Musculoskeletal   Abdominal   Peds  Hematology negative hematology ROS (+)   Anesthesia Other Findings Past Medical History:   Asthma                                                         Comment:adult onset, PFTs done in [place]   Esophageal reflux                                            Gout                                                         Hypertension                                                 Hyperlipidemia                                               Depression                                                     Comment:Treated with Prozac in 1992,  Associate with               job loss   Cancer                                                         Comment:Several moles removed in past, all               pre-cancerous.  Recently followed by Dr.               Nehemiah Massed   Hiatal hernia                                                Reproductive/Obstetrics negative OB ROS                              Anesthesia Physical Anesthesia Plan  ASA: III  Anesthesia Plan: General   Post-op Pain Management:    Induction:   Airway Management Planned:   Additional Equipment:   Intra-op Plan:   Post-operative Plan:   Informed Consent: I have reviewed the patients History and Physical, chart, labs and discussed the procedure including the risks, benefits and alternatives for the proposed anesthesia with the patient or authorized representative who has indicated his/her understanding and acceptance.   Dental Advisory Given  Plan Discussed with: Anesthesiologist, CRNA and Surgeon  Anesthesia Plan Comments:         Anesthesia Quick Evaluation

## 2014-12-27 NOTE — H&P (Signed)
Outpatient short stay form Pre-procedure 12/27/2014 1:52 PM Lollie Sails MD  Primary Physician: Dr. Ronette Deter  Reason for visit:  Colonoscopy  History of present illness:  Patient is a 69 year old male any for a colonoscopy. As a personal history of colon polyps. His last colonoscopy was 10/06/2009.  He is held his 81 mg aspirin for several days and does not take any other anticoagulation drugs.  Tolerated his prep well.    Current facility-administered medications:  .  0.9 %  sodium chloride infusion, , Intravenous, Continuous, Lollie Sails, MD, Last Rate: 20 mL/hr at 12/27/14 1308, 1,000 mL at 12/27/14 1308 .  0.9 %  sodium chloride infusion, , Intravenous, Continuous, Lollie Sails, MD  Prescriptions prior to admission  Medication Sig Dispense Refill Last Dose  . aspirin 81 MG chewable tablet Chew by mouth daily.   12/23/2014  . albuterol (PROAIR HFA) 108 (90 BASE) MCG/ACT inhaler Inhale 2 puffs into the lungs 4 (four) times daily as needed. 18 g 1 Taking  . Ascorbic Acid (VITAMIN C) 1000 MG tablet Take 1,000 mg by mouth daily.     Taking  . Cyanocobalamin (VITAMIN B-12) 5000 MCG SUBL Place 2 each under the tongue daily.     Taking  . doxycycline (VIBRAMYCIN) 50 MG capsule Take 50 mg by mouth daily.    Taking  . finasteride (PROSCAR) 5 MG tablet TAKE 1 TABLET BY MOUTH EVERY DAY 30 tablet 5 Taking  . Fluticasone-Salmeterol (ADVAIR DISKUS) 250-50 MCG/DOSE AEPB Inhale 1 puff into the lungs as needed. 60 each 5 Taking  . folic acid (FOLVITE) 270 MCG tablet Take 800 mcg by mouth daily.    Taking  . lisinopril (PRINIVIL,ZESTRIL) 10 MG tablet TAKE 1 TABLET BY MOUTH EVERY DAY 90 tablet 1 Taking  . montelukast (SINGULAIR) 10 MG tablet TAKE 1 TABLET BY MOUTH AT BEDTIME 90 tablet 1 Taking  . Multiple Vitamin (MULTIVITAMIN) capsule Take 1 capsule by mouth daily.     Taking  . pantoprazole (PROTONIX) 40 MG tablet TAKE 1 TABLET BY MOUTH EVERY DAY -- 1 HOUR BEFORE A MEAL AS  DIRECTED 30 tablet 5   . Pyridoxine HCl (VITAMIN B-6 CR) 200 MG TBCR Take by mouth.     Taking     Allergies  Allergen Reactions  . Sulfa Drugs Cross Reactors     Blood in urine     Past Medical History  Diagnosis Date  . Asthma     adult onset, PFTs done in [place]  . Esophageal reflux   . Gout   . Hypertension   . Hyperlipidemia   . Depression     Treated with Prozac in 1992,  Associate with job loss  . Cancer     Several moles removed in past, all pre-cancerous.  Recently followed by Dr. Nehemiah Massed  . Hiatal hernia     Review of systems:      Physical Exam    Heart and lungs: Regular rate and rhythm without rub or gallop, lungs are bilaterally clear    HEENT: Normocephalic atraumatic eyes are anicteric    Other:     Pertinant exam for procedure: Soft nontender nondistended bowel sounds positive normoactive    Planned proceedures: Colonoscopy and indicated procedures I have discussed the risks benefits and complications of procedures to include not limited to bleeding, infection, perforation and the risk of sedation and the patient wishes to proceed.    Lollie Sails, MD Gastroenterology 12/27/2014  1:52 PM

## 2014-12-27 NOTE — Transfer of Care (Signed)
Immediate Anesthesia Transfer of Care Note  Patient: Alexander Fitzgerald  Procedure(s) Performed: Procedure(s): COLONOSCOPY (N/A)  Patient Location: Endoscopy Unit  Anesthesia Type:General  Level of Consciousness: sedated  Airway & Oxygen Therapy: Patient Spontanous Breathing and Patient connected to nasal cannula oxygen  Post-op Assessment: Report given to RN and Post -op Vital signs reviewed and stable  Post vital signs: Reviewed and stable  Last Vitals:  Filed Vitals:   12/27/14 1247  BP: 123/77  Pulse: 86  Temp: 36.8 C  Resp: 12    Complications: No apparent anesthesia complications

## 2014-12-27 NOTE — Op Note (Signed)
[facility] Gastroenterology Patient Name: Alexander Fitzgerald Procedure Date: 12/27/2014 1:51 PM MRN: [id number] Account #: [id number] Date of Birth: [birth year] Admit Type: Outpatient Age: 69 Room: [facility] ENDO ROOM 1 Gender: Male Note Status: Finalized Procedure:         Colonoscopy Indications:       Personal history of colonic polyps Providers:         Lollie Sails, MD Referring MD:      Eduard Clos. Gilford Rile, MD (Referring MD) Medicines:         Monitored Anesthesia Care Complications:     No immediate complications. Procedure:         Pre-Anesthesia Assessment:                    - ASA Grade Assessment: III - A patient with severe                     systemic disease.                    After obtaining informed consent, the colonoscope was                     passed under direct vision. Throughout the procedure, the                     patient's blood pressure, pulse, and oxygen saturations                     were monitored continuously. The Colonoscope was                     introduced through the anus and advanced to the the cecum,                     identified by appendiceal orifice and ileocecal valve. The                     colonoscopy was performed without difficulty. The patient                     tolerated the procedure well. The quality of the bowel                     preparation was good. Findings:      A 1 mm polyp was found in the sigmoid colon. The polyp was sessile. The       polyp was removed with a cold biopsy forceps. Resection and retrieval       were complete.      Multiple small-mouthed diverticula were found in the sigmoid colon.      The retroflexed view of the distal rectum and anal verge was normal and       showed no anal or rectal abnormalities.      The digital rectal exam was normal. Impression:        - One 1 mm polyp in the sigmoid colon. Resected and                     retrieved.                    - Diverticulosis in  the sigmoid colon.                    - The  distal rectum and anal verge are normal on                     retroflexion view. Recommendation:    - Discharge patient to home.                    - Telephone GI clinic for pathology results in 1 week. Procedure Code(s): --- Professional ---                    [phone], Colonoscopy, flexible; with biopsy, single or                     multiple Diagnosis Code(s): --- Professional ---                    211.3, Benign neoplasm of colon                    V12.72, Personal history of colonic polyps                    562.10, Diverticulosis of colon (without mention of                     hemorrhage) CPT copyright 2014 American Medical Association. All rights reserved. The codes documented in this report are preliminary and upon coder review may  be revised to meet current compliance requirements. Lollie Sails, MD 12/27/2014 2:42:11 PM This report has been signed electronically. Number of Addenda: 0 Note Initiated On: 12/27/2014 1:51 PM Scope Withdrawal Time: 0 hours 12 minutes 7 seconds  Total Procedure Duration: 0 hours 15 minutes 59 seconds       [facility]

## 2014-12-28 NOTE — Anesthesia Postprocedure Evaluation (Signed)
  Anesthesia Post-op Note  Patient: Alexander Fitzgerald  Procedure(s) Performed: Procedure(s): COLONOSCOPY (N/A)  Anesthesia type:General  Patient location: PACU  Post pain: Pain level controlled  Post assessment: Post-op Vital signs reviewed, [name] Cardiovascular Status Stable, Respiratory Function Stable, Patent Airway and No signs of Nausea or vomiting  Post vital signs: Reviewed and stable  Last Vitals:  Filed Vitals:   12/27/14 1505  BP: 115/75  Pulse: 80  Temp:   Resp: 20    Level of consciousness: awake, alert  and patient cooperative  Complications: No apparent anesthesia complications

## 2014-12-31 LAB — SURGICAL PATHOLOGY

## 2015-01-19 ENCOUNTER — Other Ambulatory Visit: Payer: Self-pay | Admitting: Internal Medicine

## 2015-04-03 ENCOUNTER — Encounter: Payer: Self-pay | Admitting: Family Medicine

## 2015-04-03 ENCOUNTER — Ambulatory Visit (INDEPENDENT_AMBULATORY_CARE_PROVIDER_SITE_OTHER): Payer: Medicare PPO | Admitting: Family Medicine

## 2015-04-03 VITALS — BP 118/72 | HR 94 | Temp 98.7°F | Wt 218.0 lb

## 2015-04-03 DIAGNOSIS — J209 Acute bronchitis, unspecified: Secondary | ICD-10-CM | POA: Diagnosis not present

## 2015-04-03 NOTE — Progress Notes (Signed)
Patient ID: ARTH HEDINGER, male   DOB: 22-Jan-1946, 69 y.o.   MRN: [id number]  [name], MD Phone: [phone]  Alexander Fitzgerald is a 69 y.o. male who presents today for same day visit.  Patient notes starting 3 weeks ago he had developed mild cough. This is accompanied by some achy joints and some sweats. No documented fevers. Patient felt like he had a viral illness. He notes he did get exposed to some woodworking dust as well during this time. He notes his cough has been nonproductive. His symptoms of aching joints and sweats resolved quickly. The cough has persisted mildly. It is improving. He has been using his Advair inhaler once daily. Has been using albuterol 3-4 times a day. He has not had any wheezing or shortness of breath. He states he is using the albuterol due to a sensation of irritation in his bronchial tubes. He has not had any chest pain. Overall he feels improved.  PMH: Former smoker.   ROS see history of present illness  Objective  Physical Exam Filed Vitals:   04/03/15 1321  BP: 118/72  Pulse: 94  Temp: 98.7 F (37.1 C)    Physical Exam  Constitutional: He is well-developed, well-nourished, and in no distress.  HENT:  Head: Normocephalic and atraumatic.  Right Ear: External ear normal.  Left Ear: External ear normal.  Mouth/Throat: Oropharynx is clear and moist. No oropharyngeal exudate.  Eyes: Conjunctivae are normal. Pupils are equal, round, and reactive to light.  Neck: Neck supple.  Cardiovascular: Normal rate, regular rhythm and normal heart sounds.  Exam reveals no gallop and no friction rub.   No murmur heard. Pulmonary/Chest: Effort normal and breath sounds normal. No respiratory distress. He has no wheezes. He has no rales.  Lymphadenopathy:    He has no cervical adenopathy.  Neurological: He is alert. Gait normal.  Skin: Skin is warm and dry. He is not diaphoretic.     Assessment/Plan: Please see individual problem list.  Acute  bronchitis Symptoms seem consistent with an acute bronchitis. Likely viral in nature. Patient has been improving. Still with mild cough. Feels well overall. Vital signs are stable. Benign lung exam today. Given that there are no focal findings on exam he does not need an antibiotic at this time. His lungs sound clear and thus does not need steroids at this time. He will continue as needed albuterol and daily Advair. He is given return precautions.   Dragon [employer] training and education officer was used during the dictation process of this note. If any phrases or words seem inappropriate it is likely secondary to the translation process being inefficient.  [name]

## 2015-04-03 NOTE — Assessment & Plan Note (Signed)
Symptoms seem consistent with an acute bronchitis. Likely viral in nature. Patient has been improving. Still with mild cough. Feels well overall. Vital signs are stable. Benign lung exam today. Given that there are no focal findings on exam he does not need an antibiotic at this time. His lungs sound clear and thus does not need steroids at this time. He will continue as needed albuterol and daily Advair. He is given return precautions.

## 2015-04-03 NOTE — Patient Instructions (Signed)
Nice to meet you. You likely had bronchitis. The cough with this can last anywhere from 4-8 weeks. You should continue to use Advair and albuterol. If you develops shortness of breath, chest pain, cough productive of sputum, fevers, chills, wheezing, or any new or changing symptoms please seek medical attention.

## 2015-04-03 NOTE — Progress Notes (Signed)
Pre visit review using our clinic review tool, if applicable. No additional management support is needed unless otherwise documented below in the visit note. 

## 2015-04-12 ENCOUNTER — Other Ambulatory Visit: Payer: Self-pay | Admitting: Internal Medicine

## 2015-04-15 NOTE — Telephone Encounter (Signed)
Advise? Refilled in June.

## 2015-04-16 ENCOUNTER — Ambulatory Visit (INDEPENDENT_AMBULATORY_CARE_PROVIDER_SITE_OTHER): Payer: Medicare PPO | Admitting: Family Medicine

## 2015-04-16 ENCOUNTER — Encounter: Payer: Self-pay | Admitting: Family Medicine

## 2015-04-16 VITALS — BP 110/62 | HR 78 | Temp 98.1°F | Ht 69.0 in | Wt 216.2 lb

## 2015-04-16 DIAGNOSIS — J209 Acute bronchitis, unspecified: Secondary | ICD-10-CM | POA: Diagnosis not present

## 2015-04-16 MED ORDER — HYDROCOD POLST-CPM POLST ER 10-8 MG/5ML PO SUER: 5.0000 mL | Freq: Two times a day (BID) | ORAL | Status: DC | PRN

## 2015-04-16 MED ORDER — AMOXICILLIN-POT CLAVULANATE 875-125 MG PO TABS: 1.0000 | ORAL_TABLET | Freq: Two times a day (BID) | ORAL | Status: DC

## 2015-04-16 MED ORDER — PREDNISONE 50 MG PO TABS: ORAL_TABLET | ORAL | Status: DC

## 2015-04-16 NOTE — Progress Notes (Signed)
Pre visit review using our clinic review tool, if applicable. No additional management support is needed unless otherwise documented below in the visit note. 

## 2015-04-16 NOTE — Assessment & Plan Note (Signed)
Recent problem with continued symptoms. Treating with Tussionex and prednisone. Delayed antibiotic - advised that he should proceed with the above treatment and only fill the antibiotic (Augmentin) if he fails to improve over the next few days.

## 2015-04-16 NOTE — Progress Notes (Signed)
Subjective:  Patient ID: Alexander Fitzgerald, male    DOB: Dec 11, 1945  Age: 69 y.o. MRN: [id number]  CC: Cough, head congestion, left ear clogged  HPI:  69 year old male presents with the above complaints.  Patient was recently seen on 12/15 and was diagnosed with acute bronchitis. He was treated with supportive care. He presents today with continued complaints of cough. He states the cough has worsened and is now productive. He also reports sinus/head congestion and left ear pain/fullness. He's been taking over-the-counter medications (Mucinex, Tylenol Sinus, Allegra) with little improvement. No known existing factors. No associated shortness of breath. No reported fever. He is concerned given the duration of his illness and the fact that he's not improving.  Social Hx   Social History   Social History  . Marital Status: Married    Spouse Name: N/A  . Number of Children: N/A  . Years of Education: N/A   Social History Main Topics  . Smoking status: Former Smoker -- 0.50 packs/day for 20 years    Types: Cigarettes    Quit date: 06/15/1999  . Smokeless tobacco: None  . Alcohol Use: 0.5 oz/week    1 Standard drinks or equivalent per week     Comment: rarey  . Drug Use: No  . Sexual Activity: Not Asked   Other Topics Concern  . None   Social History Narrative   Review of Systems  HENT: Positive for congestion, ear pain and sinus pressure.   Respiratory: Positive for cough.     Objective:  BP 110/62 mmHg  Pulse 78  Temp(Src) 98.1 F (36.7 C) (Oral)  Ht 5\' 9"  (1.753 m)  Wt 216 lb 4 oz (98.09 kg)  BMI 31.92 kg/m2  SpO2 95%  BP/Weight 04/16/2015 [id number]  Systolic BP [id number]  Diastolic BP 62 72 75  Wt. (Lbs) 216.25 218 205  BMI 31.92 30.42 28.6   Physical Exam  Constitutional: He appears well-developed. No distress.  HENT:  Head: Normocephalic and atraumatic.  Mouth/Throat: Oropharynx is clear and moist.  TMs partially obscured by cerumen  bilaterally. No abnormalities noted in the visualized area.  Neck: Neck supple.  Cardiovascular: Normal rate and regular rhythm.   Pulmonary/Chest: Effort normal and breath sounds normal. No respiratory distress. He has no wheezes. He has no rales.  Neurological: He is alert.  Psychiatric: He has a normal mood and affect.  Vitals reviewed.   Lab Results  Component Value Date   WBC 7.3 05/03/2014   HGB 16.8 05/03/2014   HCT 50.5 05/03/2014   PLT 232.0 05/03/2014   GLUCOSE 101* 10/28/2014   CHOL 180 04/15/2014   TRIG 145.0 04/15/2014   HDL 51.70 04/15/2014   LDLCALC 99 04/15/2014   ALT 28 10/28/2014   AST 26 10/28/2014   NA 138 10/28/2014   K 4.2 10/28/2014   CL 104 10/28/2014   CREATININE 1.05 10/28/2014   BUN 17 10/28/2014   CO2 29 10/28/2014   PSA 0.34 04/15/2014   INR 0.93 01/13/2011   MICROALBUR 0.8 04/15/2014    Assessment & Plan:   Problem List Items Addressed This Visit    Acute bronchitis - Primary    Recent problem with continued symptoms. Treating with Tussionex and prednisone. Delayed antibiotic - advised that he should proceed with the above treatment and only fill the antibiotic (Augmentin) if he fails to improve over the next few days.         Meds ordered this encounter  Medications  .  predniSONE (DELTASONE) 50 MG tablet    Sig: 1 tablet daily x 5 days.    Dispense:  5 tablet    Refill:  0  . chlorpheniramine-HYDROcodone (TUSSIONEX PENNKINETIC ER) 10-8 MG/5ML SUER    Sig: Take 5 mLs by mouth every 12 (twelve) hours as needed.    Dispense:  115 mL    Refill:  0  . amoxicillin-clavulanate (AUGMENTIN) 875-125 MG tablet    Sig: Take 1 tablet by mouth 2 (two) times daily.    Dispense:  20 tablet    Refill:  0    Follow-up: PRN  [place]

## 2015-04-16 NOTE — Patient Instructions (Signed)
Take the prednisone, and use the cough syrup as needed.  If no improvement in the next few days you can fill the antibiotic.  Take care  Dr. Lacinda Axon

## 2015-04-20 ENCOUNTER — Other Ambulatory Visit: Payer: Self-pay | Admitting: Internal Medicine

## 2015-05-05 ENCOUNTER — Ambulatory Visit (INDEPENDENT_AMBULATORY_CARE_PROVIDER_SITE_OTHER): Payer: Medicare PPO

## 2015-05-05 VITALS — BP 118/70 | HR 78 | Temp 98.1°F | Resp 14 | Ht 69.0 in | Wt 215.8 lb

## 2015-05-05 DIAGNOSIS — Z23 Encounter for immunization: Secondary | ICD-10-CM | POA: Diagnosis not present

## 2015-05-05 DIAGNOSIS — Z0000 Encounter for general adult medical examination without abnormal findings: Secondary | ICD-10-CM | POA: Diagnosis not present

## 2015-05-05 NOTE — Patient Instructions (Addendum)
Alexander Fitzgerald,  Thank you for taking time to come for your Medicare Wellness Visit.  I appreciate your ongoing commitment to your health goals. Please review the following plan we discussed and let me know if I can assist you in the future. Health Maintenance, Male A healthy lifestyle and preventative care can promote health and wellness.  Maintain regular health, dental, and eye exams.  Eat a healthy diet. Foods like vegetables, fruits, whole grains, low-fat dairy products, and lean protein foods contain the nutrients you need and are low in calories. Decrease your intake of foods high in solid fats, added sugars, and salt. Get information about a proper diet from your health care provider, if necessary.  Regular physical exercise is one of the most important things you can do for your health. Most adults should get at least 150 minutes of moderate-intensity exercise (any activity that increases your heart rate and causes you to sweat) each week. In addition, most adults need muscle-strengthening exercises on 2 or more days a week.   Maintain a healthy weight. The body mass index (BMI) is a screening tool to identify possible weight problems. It provides an estimate of body fat based on height and weight. Your health care provider can find your BMI and can help you achieve or maintain a healthy weight. For males 20 years and older:  A BMI below 18.5 is considered underweight.  A BMI of 18.5 to 24.9 is normal.  A BMI of 25 to 29.9 is considered overweight.  A BMI of 30 and above is considered obese.  Maintain normal blood lipids and cholesterol by exercising and minimizing your intake of saturated fat. Eat a balanced diet with plenty of fruits and vegetables. Blood tests for lipids and cholesterol should begin at age 43 and be repeated every 5 years. If your lipid or cholesterol levels are high, you are over age 10, or you are at high risk for heart disease, you may need your cholesterol levels  checked more frequently.Ongoing high lipid and cholesterol levels should be treated with medicines if diet and exercise are not working.  If you smoke, find out from your health care provider how to quit. If you do not use tobacco, do not start.  Lung cancer screening is recommended for adults aged 39-80 years who are at high risk for developing lung cancer because of a history of smoking. A yearly low-dose CT scan of the lungs is recommended for people who have at least a 30-pack-year history of smoking and are current smokers or have quit within the past 15 years. A pack year of smoking is smoking an average of 1 pack of cigarettes a day for 1 year (for example, a 30-pack-year history of smoking could mean smoking 1 pack a day for 30 years or 2 packs a day for 15 years). Yearly screening should continue until the smoker has stopped smoking for at least 15 years. Yearly screening should be stopped for people who develop a health problem that would prevent them from having lung cancer treatment.  If you choose to drink alcohol, do not have more than 2 drinks per day. One drink is considered to be 12 oz (360 mL) of beer, 5 oz (150 mL) of wine, or 1.5 oz (45 mL) of liquor.  Avoid the use of street drugs. Do not share needles with anyone. Ask for help if you need support or instructions about stopping the use of drugs.  High blood pressure causes heart disease and  increases the risk of stroke. High blood pressure is more likely to develop in:  People who have blood pressure in the end of the normal range (100-139/85-89 mm Hg).  People who are overweight or obese.  People who are African American.  If you are 56-55 years of age, have your blood pressure checked every 3-5 years. If you are 27 years of age or older, have your blood pressure checked every year. You should have your blood pressure measured twice--once when you are at a hospital or clinic, and once when you are not at a hospital or clinic.  Record the average of the two measurements. To check your blood pressure when you are not at a hospital or clinic, you can use:  An automated blood pressure machine at a pharmacy.  A home blood pressure monitor.  If you are 51-21 years old, ask your health care provider if you should take aspirin to prevent heart disease.  Diabetes screening involves taking a blood sample to check your fasting blood sugar level. This should be done once every 3 years after age 87 if you are at a normal weight and without risk factors for diabetes. Testing should be considered at a younger age or be carried out more frequently if you are overweight and have at least 1 risk factor for diabetes.  Colorectal cancer can be detected and often prevented. Most routine colorectal cancer screening begins at the age of 39 and continues through age 86. However, your health care provider may recommend screening at an earlier age if you have risk factors for colon cancer. On a yearly basis, your health care provider may provide home test kits to check for hidden blood in the stool. A small camera at the end of a tube may be used to directly examine the colon (sigmoidoscopy or colonoscopy) to detect the earliest forms of colorectal cancer. Talk to your health care provider about this at age 45 when routine screening begins. A direct exam of the colon should be repeated every 5-10 years through age 43, unless early forms of precancerous polyps or small growths are found.  People who are at an increased risk for hepatitis B should be screened for this virus. You are considered at high risk for hepatitis B if:  You were born in a country where hepatitis B occurs often. Talk with your health care provider about which countries are considered high risk.  Your parents were born in a high-risk country and you have not received a shot to protect against hepatitis B (hepatitis B vaccine).  You have HIV or AIDS.  You use needles to  inject street drugs.  You live with, or have sex with, someone who has hepatitis B.  You are a man who has sex with other men (MSM).  You get hemodialysis treatment.  You take certain medicines for conditions like cancer, organ transplantation, and autoimmune conditions.  Hepatitis C blood testing is recommended for all people born from [birth year] through 1964 and any individual with known risk factors for hepatitis C.  Healthy men should no longer receive prostate-specific antigen (PSA) blood tests as part of routine cancer screening. Talk to your health care provider about prostate cancer screening.  Testicular cancer screening is not recommended for adolescents or adult males who have no symptoms. Screening includes self-exam, a health care provider exam, and other screening tests. Consult with your health care provider about any symptoms you have or any concerns you have about testicular cancer.  Practice safe sex. Use condoms and avoid high-risk sexual practices to reduce the spread of sexually transmitted infections (STIs).  You should be screened for STIs, including gonorrhea and chlamydia if:  You are sexually active and are younger than 24 years.  You are older than 24 years, and your health care provider tells you that you are at risk for this type of infection.  Your sexual activity has changed since you were last screened, and you are at an increased risk for chlamydia or gonorrhea. Ask your health care provider if you are at risk.  If you are at risk of being infected with HIV, it is recommended that you take a prescription medicine daily to prevent HIV infection. This is called pre-exposure prophylaxis (PrEP). You are considered at risk if:  You are a man who has sex with other men (MSM).  You are a heterosexual man who is sexually active with multiple partners.  You take drugs by injection.  You are sexually active with a partner who has HIV.  Talk with your health care  provider about whether you are at high risk of being infected with HIV. If you choose to begin PrEP, you should first be tested for HIV. You should then be tested every 3 months for as long as you are taking PrEP.  Use sunscreen. Apply sunscreen liberally and repeatedly throughout the day. You should seek shade when your shadow is shorter than you. Protect yourself by wearing long sleeves, pants, a wide-brimmed hat, and sunglasses year round whenever you are outdoors.  Tell your health care provider of new moles or changes in moles, especially if there is a change in shape or color. Also, tell your health care provider if a mole is larger than the size of a pencil eraser.  A one-time screening for abdominal aortic aneurysm (AAA) and surgical repair of large AAAs by ultrasound is recommended for men aged 82-75 years who are current or former smokers.  Stay current with your vaccines (immunizations).   This information is not intended to replace advice given to you by your health care provider. Make sure you discuss any questions you have with your health care provider.   Document Released: 10/02/2007 Document Revised: 04/26/2014 Document Reviewed: 08/31/2010 Elsevier Interactive Patient Education [employer].

## 2015-05-05 NOTE — Progress Notes (Signed)
Annual Wellness Visit as completed by Health Coach was reviewed in full.  

## 2015-05-05 NOTE — Progress Notes (Signed)
Subjective:   Alexander Fitzgerald is a 70 y.o. male who presents for Medicare Annual/Subsequent preventive examination.  Review of Systems:  No ROS.  Medicare Wellness Visit. Cardiac Risk Factors include: hypertension     Objective:    Vitals: BP 118/70 mmHg  Pulse 78  Temp(Src) 98.1 F (36.7 C) (Oral)  Resp 14  Ht 5\' 9"  (1.753 m)  Wt 215 lb 12.8 oz (97.886 kg)  BMI 31.85 kg/m2  SpO2 95%  Tobacco History  Smoking status  . Former Smoker -- 0.50 packs/day for 20 years  . Types: Cigarettes  . Quit date: 06/15/1999  Smokeless tobacco  . Not on file     Counseling given: Not Answered   Past Medical History  Diagnosis Date  . Asthma     adult onset, PFTs done in [place]  . Esophageal reflux   . Gout   . Hypertension   . Hyperlipidemia   . Depression     Treated with Prozac in 1992,  Associate with job loss  . Cancer ([place])     Several moles removed in past, all pre-cancerous.  Recently followed by Dr. Nehemiah Massed  . Hiatal hernia    Past Surgical History  Procedure Laterality Date  . Appendectomy    . Tonsillectomy    . Laparoscopic paraesophageal hernia repair    . Colonoscopy N/A 12/27/2014    Procedure: COLONOSCOPY;  Surgeon: Lollie Sails, MD;  Location: [facility] ENDOSCOPY;  Service: Endoscopy;  Laterality: N/A;   Family History  Problem Relation Age of Onset  . Hypertension Mother   . Hyperlipidemia Mother   . Heart attack Father 31    MI  . Cancer Sister    History  Sexual Activity  . Sexual Activity: Yes    Outpatient Encounter Prescriptions as of 05/05/2015  Medication Sig  . ADVAIR DISKUS 250-50 MCG/DOSE AEPB INHALE 1 PUFF BY MOUTH AS NEEDED.  Marland Kitchen albuterol (PROAIR HFA) 108 (90 BASE) MCG/ACT inhaler Inhale 2 puffs into the lungs 4 (four) times daily as needed.  . Ascorbic Acid (VITAMIN C) 1000 MG tablet Take 1,000 mg by mouth daily.    Marland Kitchen aspirin 81 MG chewable tablet Chew by mouth daily.  . Cyanocobalamin (VITAMIN B-12) 5000 MCG SUBL Place 2 each  under the tongue daily.    Marland Kitchen docusate sodium (COLACE) 100 MG capsule Take 100 mg by mouth daily.  Marland Kitchen doxycycline (VIBRAMYCIN) 50 MG capsule Take 50 mg by mouth daily.   . finasteride (PROSCAR) 5 MG tablet TAKE 1 TABLET BY MOUTH EVERY DAY  . folic acid (FOLVITE) [id number] MCG tablet Take 800 mcg by mouth daily.   Marland Kitchen lisinopril (PRINIVIL,ZESTRIL) 10 MG tablet TAKE 1 TABLET BY MOUTH EVERY DAY  . montelukast (SINGULAIR) 10 MG tablet TAKE 1 TABLET BY MOUTH AT BEDTIME  . Multiple Vitamin (MULTIVITAMIN) capsule Take 1 capsule by mouth daily.    . pantoprazole (PROTONIX) 40 MG tablet TAKE 1 TABLET BY MOUTH EVERY DAY -- 1 HOUR BEFORE A MEAL AS DIRECTED  . Pyridoxine HCl (VITAMIN B-6 CR) 200 MG TBCR Take by mouth.    . [DISCONTINUED] amoxicillin-clavulanate (AUGMENTIN) 875-125 MG tablet Take 1 tablet by mouth 2 (two) times daily.  . [DISCONTINUED] chlorpheniramine-HYDROcodone (TUSSIONEX PENNKINETIC ER) 10-8 MG/5ML SUER Take 5 mLs by mouth every 12 (twelve) hours as needed.  . [DISCONTINUED] predniSONE (DELTASONE) 50 MG tablet 1 tablet daily x 5 days.   No facility-administered encounter medications on file as of 05/05/2015.    Activities of  Daily Living In your present state of health, do you have any difficulty performing the following activities: 05/05/2015  Hearing? N  Vision? N  Difficulty concentrating or making decisions? N  Walking or climbing stairs? N  Dressing or bathing? N  Doing errands, shopping? N  Preparing Food and eating ? N  Using the Toilet? N  In the past six months, have you accidently leaked urine? N  Do you have problems with loss of bowel control? N  Managing your Medications? N  Managing your Finances? N  Housekeeping or managing your Housekeeping? N    Patient Care Team: Jackolyn Confer, MD as PCP - General (Internal Medicine)   Assessment:   This is a routine wellness examination for [place]. The goal of the wellness visit is to assist the patient how to close the gaps  in care and create a preventative care plan for the patient.   Osteoporosis risk reviewed.  Medications reviewed; taking without issues or barriers.  Safety issues reviewed; smoke detectors in the home. Firearms locked in a secure area in the home. Wears seatbelts when driving or riding with others. No violence in the home.  No identified risk were noted; The patient was oriented x 3; appropriate in dress and manner and no objective failures at ADL's or IADL's.  Pneumococcal 23 administered this visit.  Patient Concerns:  Left ear feels clogged at times with echo in the head;  Deferred to PCP for follow up.  Need for annual physical, fasting labs and Hep C screening; appointment scheduled.  No orders placed.    Exercise Activities and Dietary recommendations Current Exercise Habits:: Home exercise routine, Type of exercise: walking, Time (Minutes): 30, Frequency (Times/Week): > 6, Weekly Exercise (Minutes/Week): 0, Intensity: Mild  Goals    . Healthy Lifestyle     Continue exercise regiment of walking Make healthy food choices Drink plenty of water      Fall Risk Fall Risk  05/05/2015 04/15/2014 12/04/2012 04/26/2012  Falls in the past year? No No No No   Depression Screen PHQ 2/9 Scores 05/05/2015 04/15/2014 12/04/2012 04/26/2012  PHQ - 2 Score 0 0 0 0    Cognitive Testing MMSE - Mini Mental State Exam 05/05/2015  Orientation to time 5  Orientation to Place 5  Registration 3  Attention/ Calculation 5  Recall 3  Language- name 2 objects 2  Language- repeat 1  Language- follow 3 step command 3  Language- read & follow direction 1  Write a sentence 1  Copy design 1  Total score 30    Immunization History  Administered Date(s) Administered  . Influenza Split 03/03/2013  . Influenza,inj,Quad PF,36+ Mos 01/16/2014  . Influenza-Unspecified 01/25/2012, 02/18/2015  . Pneumococcal Conjugate-13 06/04/2013  . Pneumococcal Polysaccharide-23 10/25/2008, 05/05/2015  . Tdap  04/26/2009  . Zoster 03/03/2013   Screening Tests Health Maintenance  Topic Date Due  . Hepatitis C Screening  February 01, 1946  . INFLUENZA VACCINE  11/18/2015  . TETANUS/TDAP  04/27/2019  . COLONOSCOPY  12/26/2024  . ZOSTAVAX  Completed  . PNA vac Low Risk Adult  Completed      Plan:   End of life planning; Advance aging; Advanced directives discussed. Copy requested of current HCPOA/Living Will.   Return in 1 week for CPE and follow up with L ear issues.  During the course of the visit the patient was educated and counseled about the following appropriate screening and preventive services:   Vaccines to include Pneumoccal, Influenza, Hepatitis B, Td,  Zostavax, HCV  Electrocardiogram  Cardiovascular Disease  Colorectal cancer screening  Diabetes screening  Prostate Cancer Screening  Glaucoma screening  Nutrition counseling   Smoking cessation counseling  Patient Instructions (the written plan) was given to the patient.    Varney Biles, LPN  [id number]

## 2015-05-12 ENCOUNTER — Encounter: Payer: Self-pay | Admitting: Internal Medicine

## 2015-05-12 ENCOUNTER — Ambulatory Visit (INDEPENDENT_AMBULATORY_CARE_PROVIDER_SITE_OTHER): Payer: Medicare PPO | Admitting: Internal Medicine

## 2015-05-12 ENCOUNTER — Ambulatory Visit (INDEPENDENT_AMBULATORY_CARE_PROVIDER_SITE_OTHER)
Admission: RE | Admit: 2015-05-12 | Discharge: 2015-05-12 | Disposition: A | Discharge status: Home / Self Care | Payer: Medicare PPO | Source: Ambulatory Visit | Attending: Internal Medicine | Admitting: Internal Medicine

## 2015-05-12 VITALS — BP 120/78 | HR 84 | Temp 98.6°F | Ht 69.0 in | Wt 212.5 lb

## 2015-05-12 DIAGNOSIS — Z0000 Encounter for general adult medical examination without abnormal findings: Secondary | ICD-10-CM | POA: Diagnosis not present

## 2015-05-12 DIAGNOSIS — I10 Essential (primary) hypertension: Secondary | ICD-10-CM | POA: Diagnosis not present

## 2015-05-12 DIAGNOSIS — Z79899 Other long term (current) drug therapy: Secondary | ICD-10-CM

## 2015-05-12 DIAGNOSIS — Z125 Encounter for screening for malignant neoplasm of prostate: Secondary | ICD-10-CM | POA: Diagnosis not present

## 2015-05-12 LAB — CBC WITH DIFFERENTIAL/PLATELET
Basophils Absolute: 0 10*3/uL (ref 0.0–0.1)
Basophils Relative: 0.4 % (ref 0.0–3.0)
EOS ABS: 0.4 10*3/uL (ref 0.0–0.7)
EOS PCT: 6.2 % — AB (ref 0.0–5.0)
HCT: 47.6 % (ref 39.0–52.0)
Hemoglobin: 15.9 g/dL (ref 13.0–17.0)
LYMPHS ABS: 1.6 10*3/uL (ref 0.7–4.0)
Lymphocytes Relative: 27 % (ref 12.0–46.0)
MCHC: 33.3 g/dL (ref 30.0–36.0)
MCV: 86.1 fl (ref 78.0–100.0)
MONO ABS: 0.5 10*3/uL (ref 0.1–1.0)
Monocytes Relative: 8.3 % (ref 3.0–12.0)
NEUTROS PCT: 58.1 % (ref 43.0–77.0)
Neutro Abs: 3.4 10*3/uL (ref 1.4–7.7)
Platelets: 242 10*3/uL (ref 150.0–400.0)
RBC: 5.53 Mil/uL (ref 4.22–5.81)
RDW: 14 % (ref 11.5–15.5)
WBC: 5.8 10*3/uL (ref 4.0–10.5)

## 2015-05-12 LAB — LIPID PANEL
Cholesterol: 145 mg/dL (ref 0–200)
HDL: 48.1 mg/dL (ref >39.00)
LDL CALC: 77 mg/dL (ref 0–99)
NONHDL: 96.88
Total CHOL/HDL Ratio: 3
Triglycerides: 99 mg/dL (ref 0.0–149.0)
VLDL: 19.8 mg/dL (ref 0.0–40.0)

## 2015-05-12 LAB — COMPREHENSIVE METABOLIC PANEL
ALT: 18 U/L (ref 0–53)
AST: 17 U/L (ref 0–37)
Albumin: 4 g/dL (ref 3.5–5.2)
Alkaline Phosphatase: 75 U/L (ref 39–117)
BUN: 16 mg/dL (ref 6–23)
CHLORIDE: 105 meq/L (ref 96–112)
CO2: 27 mEq/L (ref 19–32)
Calcium: 9 mg/dL (ref 8.4–10.5)
Creatinine, Ser: 1.03 mg/dL (ref 0.40–1.50)
GFR: 76.16 mL/min (ref >60.00)
GLUCOSE: 97 mg/dL (ref 70–99)
POTASSIUM: 4.4 meq/L (ref 3.5–5.1)
SODIUM: 141 meq/L (ref 135–145)
TOTAL PROTEIN: 6.2 g/dL (ref 6.0–8.3)
Total Bilirubin: 0.6 mg/dL (ref 0.2–1.2)

## 2015-05-12 LAB — VITAMIN D 25 HYDROXY (VIT D DEFICIENCY, FRACTURES): VITD: 21.91 ng/mL — AB (ref 30.00–100.00)

## 2015-05-12 LAB — TSH: TSH: 0.32 u[IU]/mL — AB (ref 0.35–4.50)

## 2015-05-12 LAB — PSA, MEDICARE: PSA: 0.31 ng/ml (ref 0.10–4.00)

## 2015-05-12 MED ORDER — GENTAMICIN SULFATE 0.1 % EX OINT: 1.0000 "application " | TOPICAL_OINTMENT | Freq: Two times a day (BID) | CUTANEOUS | Status: DC

## 2015-05-12 MED ORDER — FLUTICASONE-SALMETEROL 250-50 MCG/DOSE IN AEPB: INHALATION_SPRAY | RESPIRATORY_TRACT | Status: DC

## 2015-05-12 NOTE — Progress Notes (Signed)
Subjective:    Patient ID: Alexander Fitzgerald, male    DOB: 08-19-1945, 70 y.o.   MRN: [id number]  HPI  70YO male presents for physical exam.  Feeling well. Started woodworking. Taking classes. Treated recently for bronchitis with Augmentin and Prednisone. Improving some. Continues to use inhalers. Feels some symptoms caused by weather.   Wt Readings from Last 3 Encounters:  05/12/15 212 lb 8 oz (96.389 kg)  05/05/15 215 lb 12.8 oz (97.886 kg)  04/16/15 216 lb 4 oz (98.09 kg)   BP Readings from Last 3 Encounters:  05/12/15 120/78  05/05/15 118/70  04/16/15 110/62    Past Medical History  Diagnosis Date  . Asthma     adult onset, PFTs done in [place]  . Esophageal reflux   . Gout   . Hypertension   . Hyperlipidemia   . Depression     Treated with Prozac in 1992,  Associate with job loss  . Cancer ([place])     Several moles removed in past, all pre-cancerous.  Recently followed by Dr. Nehemiah Massed  . Hiatal hernia    Family History  Problem Relation Age of Onset  . Hypertension Mother   . Hyperlipidemia Mother   . Heart attack Father 11    MI  . Cancer Sister    Past Surgical History  Procedure Laterality Date  . Appendectomy    . Tonsillectomy    . Laparoscopic paraesophageal hernia repair    . Colonoscopy N/A 12/27/2014    Procedure: COLONOSCOPY;  Surgeon: Lollie Sails, MD;  Location: [facility] ENDOSCOPY;  Service: Endoscopy;  Laterality: N/A;   Social History   Social History  . Marital Status: Married    Spouse Name: N/A  . Number of Children: N/A  . Years of Education: N/A   Social History Main Topics  . Smoking status: Former Smoker -- 0.50 packs/day for 20 years    Types: Cigarettes    Quit date: 06/15/1999  . Smokeless tobacco: Never Used  . Alcohol Use: 0.6 oz/week    1 Standard drinks or equivalent per week     Comment: rarely  . Drug Use: No  . Sexual Activity: Yes   Other Topics Concern  . None   Social History Narrative    Review of  Systems  Constitutional: Negative for fever, chills, activity change, appetite change, fatigue and unexpected weight change.  Eyes: Negative for visual disturbance.  Respiratory: Positive for cough and shortness of breath. Negative for chest tightness and wheezing.   Cardiovascular: Negative for chest pain, palpitations and leg swelling.  Gastrointestinal: Negative for nausea, vomiting, abdominal pain, diarrhea, constipation and abdominal distention.  Genitourinary: Negative for dysuria, urgency and difficulty urinating.  Musculoskeletal: Negative for arthralgias and gait problem.  Skin: Negative for color change and rash.  Hematological: Negative for adenopathy.  Psychiatric/Behavioral: Negative for sleep disturbance and dysphoric mood. The patient is not nervous/anxious.        Objective:    BP 120/78 mmHg  Pulse 84  Temp(Src) 98.6 F (37 C) (Oral)  Ht 5\' 9"  (1.753 m)  Wt 212 lb 8 oz (96.389 kg)  BMI 31.37 kg/m2  SpO2 96% Physical Exam  Constitutional: He is oriented to person, place, and time. He appears well-developed and well-nourished. No distress.  HENT:  Head: Normocephalic and atraumatic.  Right Ear: External ear normal.  Left Ear: External ear normal.  Nose: Nose normal.  Mouth/Throat: Oropharynx is clear and moist. No oropharyngeal exudate.  Eyes: Conjunctivae and  EOM are normal. Pupils are equal, round, and reactive to light. Right eye exhibits no discharge. Left eye exhibits no discharge. No scleral icterus.  Neck: Normal range of motion. Neck supple. No tracheal deviation present. No thyromegaly present.  Cardiovascular: Normal rate, regular rhythm and normal heart sounds.  Exam reveals no gallop and no friction rub.   No murmur heard. Pulmonary/Chest: Effort normal and breath sounds normal. No respiratory distress. He has no wheezes. He has no rales. He exhibits no tenderness.  Abdominal: Soft. Bowel sounds are normal. He exhibits no distension and no mass. There  is no tenderness. There is no rebound and no guarding.  Musculoskeletal: Normal range of motion. He exhibits no edema.  Lymphadenopathy:    He has no cervical adenopathy.  Neurological: He is alert and oriented to person, place, and time. No cranial nerve deficit. Coordination normal.  Skin: Skin is warm and dry. No rash noted. He is not diaphoretic. No erythema. No pallor.  Psychiatric: He has a normal mood and affect. His behavior is normal. Judgment and thought content normal.          Assessment & Plan:   Problem List Items Addressed This Visit      Unprioritized   Routine general medical examination at a health care facility - Primary    General medical exam normal today. Labs ordered. Immunizations UTD. Encouraged healthy diet and exercise. Colonoscopy UTD and reviewed. Discussed benefits and limitations of PSA testing. Will check PSA with labs today.      Relevant Orders   DG Chest 2 View   CBC with Differential/Platelet   Comprehensive metabolic panel   Lipid panel   VITAMIN D 25 Hydroxy (Vit-D Deficiency, Fractures)   TSH   Hepatitis C antibody   PSA, Medicare       Return in about 6 months (around 11/09/2015) for Recheck.

## 2015-05-12 NOTE — Patient Instructions (Signed)

## 2015-05-12 NOTE — Assessment & Plan Note (Signed)
General medical exam normal today. Labs ordered. Immunizations UTD. Encouraged healthy diet and exercise. Colonoscopy UTD and reviewed. Discussed benefits and limitations of PSA testing. Will check PSA with labs today.

## 2015-05-12 NOTE — Progress Notes (Signed)
Pre visit review using our clinic review tool, if applicable. No additional management support is needed unless otherwise documented below in the visit note. 

## 2015-05-13 LAB — HEPATITIS C ANTIBODY: HCV AB: NEGATIVE

## 2015-05-17 ENCOUNTER — Encounter: Payer: Self-pay | Admitting: Internal Medicine

## 2015-05-18 ENCOUNTER — Encounter: Payer: Self-pay | Admitting: Internal Medicine

## 2015-05-20 ENCOUNTER — Other Ambulatory Visit: Payer: Self-pay | Admitting: Internal Medicine

## 2015-05-20 ENCOUNTER — Telehealth: Payer: Self-pay | Admitting: Family Medicine

## 2015-05-20 DIAGNOSIS — M79671 Pain in right foot: Secondary | ICD-10-CM

## 2015-05-20 NOTE — Telephone Encounter (Signed)
Patient has new patient appointment for the 9th.  He is requesting to be worked in sooner if possible because he is currently limping around.  Please follow up with patient.

## 2015-05-20 NOTE — Telephone Encounter (Signed)
Visit follow up, please have someone schedule appointment for patient

## 2015-05-22 ENCOUNTER — Ambulatory Visit (INDEPENDENT_AMBULATORY_CARE_PROVIDER_SITE_OTHER): Payer: Medicare PPO | Admitting: Family Medicine

## 2015-05-22 ENCOUNTER — Encounter: Payer: Self-pay | Admitting: Family Medicine

## 2015-05-22 ENCOUNTER — Encounter: Payer: Self-pay | Admitting: Internal Medicine

## 2015-05-22 ENCOUNTER — Ambulatory Visit (INDEPENDENT_AMBULATORY_CARE_PROVIDER_SITE_OTHER): Payer: Medicare PPO

## 2015-05-22 VITALS — BP 130/86 | HR 78 | Ht 69.0 in | Wt 222.0 lb

## 2015-05-22 DIAGNOSIS — M1A0710 Idiopathic chronic gout, right ankle and foot, without tophus (tophi): Secondary | ICD-10-CM | POA: Diagnosis not present

## 2015-05-22 DIAGNOSIS — E669 Obesity, unspecified: Secondary | ICD-10-CM | POA: Diagnosis not present

## 2015-05-22 DIAGNOSIS — M205X1 Other deformities of toe(s) (acquired), right foot: Secondary | ICD-10-CM | POA: Diagnosis not present

## 2015-05-22 DIAGNOSIS — M79671 Pain in right foot: Secondary | ICD-10-CM

## 2015-05-22 DIAGNOSIS — I10 Essential (primary) hypertension: Secondary | ICD-10-CM

## 2015-05-22 DIAGNOSIS — M109 Gout, unspecified: Secondary | ICD-10-CM | POA: Insufficient documentation

## 2015-05-22 MED ORDER — LOSARTAN POTASSIUM 25 MG PO TABS: 25.0000 mg | ORAL_TABLET | Freq: Every day | ORAL | Status: DC

## 2015-05-22 NOTE — Assessment & Plan Note (Signed)
Discussed with patient about monitoring diet. We discussed changing patient's lisinopril to losartan. Patient will ask primary care provider.

## 2015-05-22 NOTE — Patient Instructions (Addendum)
Good to see you  I will ask Dr. Gilford Rile about change to losartan from lisinopril  Read about turmeric, would recommend 500mg  daily.  Love the vitamin D 2000 IU daily Good shoes with rigid bottom.  Jalene Mullet, [place] or New balance greater then 700 Avoid being barefoot for 2 weeks.  If worsening symptoms you know where I am

## 2015-05-22 NOTE — Assessment & Plan Note (Signed)
Moderate in nature. Mild arthritis. I do think that gout probably contributed. Discussed the possibility of changing some medications. Discuss continuing icing. We discussed proper shoes. Patient will follow-up again in 2 weeks if not completely resolved. Possible injection of the capsule could be beneficial.

## 2015-05-22 NOTE — Assessment & Plan Note (Signed)
Encourage weight loss. 

## 2015-05-22 NOTE — Telephone Encounter (Signed)
lmovm offering pt 11am appt today.

## 2015-05-22 NOTE — Progress Notes (Signed)
Pre visit review using our clinic review tool, if applicable. No additional management support is needed unless otherwise documented below in the visit note. 

## 2015-05-22 NOTE — Telephone Encounter (Signed)
Pt seen today

## 2015-05-22 NOTE — Telephone Encounter (Signed)
See patient's concern about [employer]. Thanks

## 2015-05-22 NOTE — Progress Notes (Signed)
[facility] [address] [place], [place] [address] Phone: [phone] Subjective:    I'm seeing this patient by the request  of:  Rica Mast, MD   CC: Right first toe pain  [id number] Alexander Fitzgerald is a 70 y.o. male coming in with complaint of right first toe pain. Patient does have a past medical history significant for gout multiple years ago. Patient has not been on any medication for it. She states that this felt different than another exacerbation. Patient was walking his dog and stubbed his toe. Had significant pain and 2 days later had significant swelling. States that this was approximately 1 week ago. Now 90% better. Still some mild discomfort with walking long distances. States that the swelling and redness has significantly improved. Does not take any medication except for ibuprofen that was somewhat helpful. Tries to avoid though secondary to his esophageal reflux disease. Ice has been helpful as well.     Past Medical History  Diagnosis Date  . Asthma     adult onset, PFTs done in [place]  . Esophageal reflux   . Gout   . Hypertension   . Hyperlipidemia   . Depression     Treated with Prozac in 1992,  Associate with job loss  . Cancer ([place])     Several moles removed in past, all pre-cancerous.  Recently followed by Dr. Nehemiah Massed  . Hiatal hernia    Past Surgical History  Procedure Laterality Date  . Appendectomy    . Tonsillectomy    . Laparoscopic paraesophageal hernia repair    . Colonoscopy N/A 12/27/2014    Procedure: COLONOSCOPY;  Surgeon: Lollie Sails, MD;  Location: [facility] ENDOSCOPY;  Service: Endoscopy;  Laterality: N/A;   Social History   Social History  . Marital Status: Married    Spouse Name: N/A  . Number of Children: N/A  . Years of Education: N/A   Social History Main Topics  . Smoking status: Former Smoker -- 0.50 packs/day for 20 years    Types: Cigarettes    Quit date: 06/15/1999  .  Smokeless tobacco: Never Used  . Alcohol Use: 0.6 oz/week    1 Standard drinks or equivalent per week     Comment: rarely  . Drug Use: No  . Sexual Activity: Yes   Other Topics Concern  . None   Social History Narrative   Allergies  Allergen Reactions  . Sulfa Drugs Cross Reactors     Blood in urine   Family History  Problem Relation Age of Onset  . Hypertension Mother   . Hyperlipidemia Mother   . Heart attack Father 54    MI  . Cancer Sister     Past medical history, social, surgical and family history all reviewed in electronic medical record.  No pertanent information unless stated regarding to the chief complaint.   Review of Systems: No headache, visual changes, nausea, vomiting, diarrhea, constipation, dizziness, abdominal pain, skin rash, fevers, chills, night sweats, weight loss, swollen lymph nodes, body aches, joint swelling, muscle aches, chest pain, shortness of breath, mood changes.   Objective Blood pressure 130/86, pulse 78, height 5\' 9"  (1.753 m), weight 222 lb (100.699 kg), SpO2 96 %.  General: No apparent distress alert and oriented x3 mood and affect normal, dressed appropriately.  HEENT: Pupils equal, extraocular movements intact  Respiratory: [name] speak in full sentences and does not appear short of breath  Cardiovascular: No lower extremity edema, non tender,  no erythema  Skin: Warm dry intact with no signs of infection or rash on extremities or on axial skeleton.  Abdomen: Soft nontender  Neuro: Cranial nerves II through XII are intact, neurovascularly intact in all extremities with 2+ DTRs and 2+ pulses.  Lymph: No lymphadenopathy of posterior or anterior cervical chain or axillae bilaterally.  Gait normal with good balance and coordination.  MSK:  Non tender with full range of motion and good stability and symmetric strength and tone of shoulders, elbows, wrist, hip, knee and ankles bilaterally.  Foot exam shows patient does have a bunion  formation over the first toe. Otherwise fairly neutral foot. Minimally tender to palpation with hallux limitus. No significant erythema at this time. Minimally tender to palpation. Neurovascularly intact distally.  Limited musculoskeletal ultrasound was performed and interpreted by Hulan Saas, M  Limited ultrasound patient's first toe on the right foot shows the patient has minimal arthritic changes. Patient does have a very large synovitis. Questionable fracture that seems to be healed at 2 old for patient's timing of injury.  Impression: Resolving capsulitis with questionable gouty deposits    Impression and Recommendations:     This case required medical decision making of moderate complexity.      Note: This dictation was prepared with Dragon dictation along with smaller phrase technology. Any transcriptional errors that result from this process are unintentional.        '

## 2015-05-26 ENCOUNTER — Ambulatory Visit: Payer: Medicare PPO | Admitting: Internal Medicine

## 2015-05-29 ENCOUNTER — Ambulatory Visit: Payer: Medicare PPO | Admitting: Family Medicine

## 2015-05-30 ENCOUNTER — Ambulatory Visit: Payer: Medicare PPO

## 2015-06-13 ENCOUNTER — Other Ambulatory Visit: Payer: Self-pay | Admitting: Internal Medicine

## 2015-06-13 NOTE — Telephone Encounter (Signed)
Sent to the pharmacy by e-scribe.  Pt has upcoming appt on 11/10/15

## 2015-06-23 ENCOUNTER — Telehealth: Payer: Self-pay | Admitting: *Deleted

## 2015-06-23 MED ORDER — PREDNISONE 50 MG PO TABS: ORAL_TABLET | ORAL | Status: DC

## 2015-06-23 NOTE — Telephone Encounter (Signed)
Pt's wife called stating that her husband is having pain in his L knee. She stated she is concerned that it may be a gout flare or a clot. I advised pt that dr Tamala Julian does not have any openings at this time & that if they are concerned that it is a clot then they need to go to the ER or urgent care to have them r/o a clot. Pt & wife denied to go to the ER or urgent care. Per dr Tamala Julian sent in prednsone to pharmacy. We advised once again that the pt should go to the ER or urgent care to r/o a clot if they are concerned of this. Once again they denied to go. They asked tos chedule an appt w/ Dr. Tamala Julian this week. I advised them he had nothing open but I could schedule them w/ Terri Piedra on Thursday @ 145p. They took the appt.

## 2015-06-26 ENCOUNTER — Ambulatory Visit (INDEPENDENT_AMBULATORY_CARE_PROVIDER_SITE_OTHER): Payer: Medicare PPO | Admitting: Family

## 2015-06-26 ENCOUNTER — Encounter: Payer: Self-pay | Admitting: Family

## 2015-06-26 VITALS — BP 134/82 | HR 91 | Temp 97.9°F | Ht 69.0 in | Wt 213.0 lb

## 2015-06-26 DIAGNOSIS — S86812A Strain of other muscle(s) and tendon(s) at lower leg level, left leg, initial encounter: Secondary | ICD-10-CM | POA: Diagnosis not present

## 2015-06-26 DIAGNOSIS — S86912A Strain of unspecified muscle(s) and tendon(s) at lower leg level, left leg, initial encounter: Secondary | ICD-10-CM

## 2015-06-26 NOTE — Progress Notes (Signed)
Pre visit review using our clinic review tool, if applicable. No additional management support is needed unless otherwise documented below in the visit note. 

## 2015-06-26 NOTE — Assessment & Plan Note (Signed)
Symptoms and exam consistent with hamstring strain near insertion. Continue over-the-counter ibuprofen as needed for discomfort. Ice and home exercise therapy. Knee sleeve as needed for discomfort. Sample of Pennsaid provided. Follow-up if symptoms worsen or fail to improve for imaging or further intervention.

## 2015-06-26 NOTE — Patient Instructions (Signed)
Thank you for choosing [facility].  Summary/Instructions:  Ice 2-3 times per day as needed.  Pennsaid - 2x per day - pinkie sized dose. Continue ibuprofen.  Knee sleeve as needed.   If your symptoms worsen or fail to improve, please contact our office for further instruction, or in case of emergency go directly to the emergency room at the closest medical facility.   Hamstring Strain A hamstring strain is an injury that occurs when the hamstring muscles are overstretched or overloaded. The hamstring muscles are a group of muscles at the back of the thighs. These muscles are used in straightening the hips, bending the knees, and pulling back the legs. This type of injury is often called a pulled hamstring muscle. The severity of a muscle strain is rated in degrees. First-degree strains have the least amount of muscle fiber tearing and pain. Second-degree and third-degree strains have increasingly more tearing and pain. CAUSES Hamstring strains occur when a sudden, violent force is placed on these muscles and stretches them too far. This often occurs during activities that involve running, jumping, kicking, or weight lifting. RISK FACTORS Hamstring strains are especially common in athletes. Other things that can increase your risk for this injury include:  Having low strength, endurance, or flexibility of the hamstring muscles.  Performing high-impact physical activity.  Having poor physical fitness.  Having a previous leg injury.  Having fatigued muscles.  Older age. SIGNS AND SYMPTOMS  Pain in the back of the thigh.  Bruising.  Swelling.  Muscle spasm.  Difficulty using the muscle because of pain or lack of normal function. For severe strains, you may have a popping or snapping feeling when the injury occurs. DIAGNOSIS Your health care provider will perform a physical exam and ask about your medical history.  TREATMENT Often, the best treatment for a hamstring  strain is protecting, resting, icing, applying compression, and elevating the injured area. This is referred to as the PRICE method of treatment. Your health care provider may also recommend medicines to help reduce pain or inflammation. HOME CARE INSTRUCTIONS  Use the PRICE method of treatment to promote muscle healing during the first 2-3 days after your injury. The PRICE method involves:  P--Protecting the muscle from being injured again.  R--Restricting your activity and resting the injured body part.  I--Icing your injury. To do this, put ice in a plastic bag. Place a towel between your skin and the bag. Then, apply the ice and leave it on for 20 minutes, 2-3 times per day. After the third day, switch to moist heat packs.  C--Applying compression to the injured area with an elastic bandage. Be careful not to wrap it too tightly. That may interfere with blood circulation or may increase swelling.  E--Elevating the injured body part above the level of your heart as often as you can. You can do this by putting a pillow under your thigh when you sit or lie down.  Take medicines only as directed by your health care provider.  Begin exercising or stretching as directed by your health care provider.  Do not return to full activity level until your health care provider approves.  Keep all follow-up visits as directed by your health care provider. This is important. SEEK MEDICAL CARE IF:  You have increasing pain or swelling in the injured area.  You have numbness, tingling, or a significant loss of strength in the injured area.  Your foot or your toes become cold or turn blue.  This information is not intended to replace advice given to you by your health care provider. Make sure you discuss any questions you have with your health care provider.   Document Released: 12/29/2000 Document Revised: 04/26/2014 Document Reviewed: 11/19/2013 Elsevier Interactive Patient Education [employer]

## 2015-06-26 NOTE — Progress Notes (Signed)
Subjective:    Patient ID: CAIDEN KOLDEN, male    DOB: 1945-09-01, 70 y.o.   MRN: [id number]  Chief Complaint  Patient presents with  . Knee Pain    prednisone sent on monday. pt states that the pain in much better since taking the prednisone. pain is now located medial to the patella of the left knee where as the pain had originally started behind the knee. pain started friday after walking his dog and the dog pulled very hard on the leash and pt was trying to hold her back. swelling has gone down significantly. pt is icing, elevating and taking ibuprofen 800 mg tid.    HPI:  JAYMEN CHIMA is a 70 y.o. male who  has a past medical history of Asthma; Esophageal reflux; Gout; Hypertension; Hyperlipidemia; Depression; Cancer ([place]); and Hiatal hernia. and presents today for an acute office visit.   This is a new problem. Associated symptom of pain located in his left knee has been going on for about 1 week following walking his dog when the dog pulled very hard on the leash and he was attempting to hold her back. Notes he felt a twinge that progressively worsened. Pain is described as. Modifying factors include ice, elevation, and ibuprofen. He was also prescribed prednisone over the phone which he has been taking as prescribed.   Allergies  Allergen Reactions  . Sulfa Drugs Cross Reactors     Blood in urine     Current Outpatient Prescriptions on File Prior to Visit  Medication Sig Dispense Refill  . aspirin 81 MG chewable tablet Chew by mouth daily.    . Cyanocobalamin (VITAMIN B-12) 5000 MCG SUBL Place 2 each under the tongue daily.      Marland Kitchen docusate sodium (COLACE) 100 MG capsule Take 100 mg by mouth daily.    Marland Kitchen doxycycline (VIBRAMYCIN) 50 MG capsule Take 50 mg by mouth daily.     . finasteride (PROSCAR) 5 MG tablet TAKE 1 TABLET BY MOUTH EVERY DAY 30 tablet 5  . folic acid (FOLVITE) Q000111Q MCG tablet Take 800 mcg by mouth daily.     Marland Kitchen gentamicin ointment (GARAMYCIN) 0.1 %  Apply 1 application topically 2 (two) times daily. 30 g 0  . losartan (COZAAR) 25 MG tablet Take 1 tablet (25 mg total) by mouth daily. 90 tablet 3  . montelukast (SINGULAIR) 10 MG tablet TAKE 1 TABLET BY MOUTH AT BEDTIME 90 tablet 1  . Multiple Vitamin (MULTIVITAMIN) capsule Take 1 capsule by mouth daily.      . pantoprazole (PROTONIX) 40 MG tablet TAKE 1 TABLET BY MOUTH EVERY DAY -- 1 HOUR BEFORE A MEAL AS DIRECTED 30 tablet 5  . predniSONE (DELTASONE) 50 MG tablet Take 1 tablet daily. 5 tablet 0  . Pyridoxine HCl (VITAMIN B-6 CR) 200 MG TBCR Take by mouth.      Marland Kitchen albuterol (PROAIR HFA) 108 (90 BASE) MCG/ACT inhaler Inhale 2 puffs into the lungs 4 (four) times daily as needed. (Patient not taking: Reported on 06/26/2015) 18 g 1  . Ascorbic Acid (VITAMIN C) 1000 MG tablet Take 1,000 mg by mouth daily. Reported on 06/26/2015    . Fluticasone-Salmeterol (ADVAIR DISKUS) 250-50 MCG/DOSE AEPB INHALE 1 PUFF BY MOUTH AS NEEDED. (Patient not taking: Reported on 06/26/2015) 60 each 11   No current facility-administered medications on file prior to visit.     Past Surgical History  Procedure Laterality Date  . Appendectomy    . Tonsillectomy    .  Laparoscopic paraesophageal hernia repair    . Colonoscopy N/A 12/27/2014    Procedure: COLONOSCOPY;  Surgeon: Lollie Sails, MD;  Location: [facility] ENDOSCOPY;  Service: Endoscopy;  Laterality: N/A;    Past Medical History  Diagnosis Date  . Asthma     adult onset, PFTs done in [place]  . Esophageal reflux   . Gout   . Hypertension   . Hyperlipidemia   . Depression     Treated with Prozac in 1992,  Associate with job loss  . Cancer ([place])     Several moles removed in past, all pre-cancerous.  Recently followed by Dr. Nehemiah Massed  . Hiatal hernia     Review of Systems  Constitutional: Negative for fever and chills.  Musculoskeletal:       Positive for left knee pain.       Objective:    BP 134/82 mmHg  Pulse 91  Temp(Src) 97.9 F (36.6 C) (Oral)  Ht  5\' 9"  (1.753 m)  Wt 213 lb (96.616 kg)  BMI 31.44 kg/m2  SpO2 97% Nursing note and vital signs reviewed.  Physical Exam  Constitutional: He is oriented to person, place, and time. He appears well-developed and well-nourished. No distress.  Cardiovascular: Normal rate, regular rhythm, normal heart sounds and intact distal pulses.   Pulmonary/Chest: Effort normal and breath sounds normal.  Musculoskeletal:  Left knee - no obvious deformity or discoloration. Mild medial edema noted. Mild discomfort over medial hamstring tendons with no deformity or crepitus noted. Range of motion is full with 5+ strength. Negative ligamentous and meniscal testing. Distal pulses and sensation are intact and appropriate.   Neurological: He is alert and oriented to person, place, and time.  Skin: Skin is warm and dry.  Psychiatric: He has a normal mood and affect. His behavior is normal. Judgment and thought content normal.       Assessment & Plan:   Problem List Items Addressed This Visit      Other   Knee strain - Primary    Symptoms and exam consistent with hamstring strain near insertion. Continue over-the-counter ibuprofen as needed for discomfort. Ice and home exercise therapy. Knee sleeve as needed for discomfort. Sample of Pennsaid provided. Follow-up if symptoms worsen or fail to improve for imaging or further intervention.

## 2015-06-30 ENCOUNTER — Encounter: Payer: Self-pay | Admitting: Internal Medicine

## 2015-06-30 DIAGNOSIS — H9190 Unspecified hearing loss, unspecified ear: Secondary | ICD-10-CM

## 2015-07-17 ENCOUNTER — Other Ambulatory Visit: Payer: Self-pay | Admitting: Internal Medicine

## 2015-07-30 DIAGNOSIS — C4492 Squamous cell carcinoma of skin, unspecified: Secondary | ICD-10-CM

## 2015-07-30 HISTORY — DX: Squamous cell carcinoma of skin, unspecified: C44.92

## 2015-08-13 ENCOUNTER — Other Ambulatory Visit: Payer: Medicare PPO

## 2015-08-13 ENCOUNTER — Ambulatory Visit (INDEPENDENT_AMBULATORY_CARE_PROVIDER_SITE_OTHER): Payer: Medicare PPO | Admitting: Family Medicine

## 2015-08-13 ENCOUNTER — Encounter: Payer: Self-pay | Admitting: Family Medicine

## 2015-08-13 ENCOUNTER — Ambulatory Visit (INDEPENDENT_AMBULATORY_CARE_PROVIDER_SITE_OTHER)
Admission: RE | Admit: 2015-08-13 | Discharge: 2015-08-13 | Disposition: A | Discharge status: Home / Self Care | Payer: Medicare PPO | Source: Ambulatory Visit | Attending: Family Medicine | Admitting: Family Medicine

## 2015-08-13 ENCOUNTER — Other Ambulatory Visit (INDEPENDENT_AMBULATORY_CARE_PROVIDER_SITE_OTHER): Payer: Medicare PPO

## 2015-08-13 VITALS — BP 120/84 | HR 95 | Ht 70.0 in | Wt 219.0 lb

## 2015-08-13 DIAGNOSIS — M25462 Effusion, left knee: Secondary | ICD-10-CM | POA: Diagnosis not present

## 2015-08-13 DIAGNOSIS — M25562 Pain in left knee: Secondary | ICD-10-CM

## 2015-08-13 DIAGNOSIS — S83249A Other tear of medial meniscus, current injury, unspecified knee, initial encounter: Secondary | ICD-10-CM | POA: Insufficient documentation

## 2015-08-13 DIAGNOSIS — S83242A Other tear of medial meniscus, current injury, left knee, initial encounter: Secondary | ICD-10-CM

## 2015-08-13 DIAGNOSIS — M129 Arthropathy, unspecified: Secondary | ICD-10-CM | POA: Diagnosis not present

## 2015-08-13 DIAGNOSIS — M1712 Unilateral primary osteoarthritis, left knee: Secondary | ICD-10-CM | POA: Insufficient documentation

## 2015-08-13 NOTE — Assessment & Plan Note (Signed)
Aspirated today injected.  Likely will feel much better  ? Gout will send to lab.  Compression sleeve and avoid certain after activity Xrays pending to rule out fx or loose body ? Meniscal injury and ay need advance imaging if locking RTC in 2 weeks.

## 2015-08-13 NOTE — Patient Instructions (Addendum)
Good to see you  Ice 20 minutes 2 times daily. Usually after activity and before bed. pennsaid pinkie amount topically 2 times daily as needed.  Xrays downstairs See me again in 2 weeks to make sure the swelling does not come back.

## 2015-08-13 NOTE — Progress Notes (Signed)
Pre visit review using our clinic review tool, if applicable. No additional management support is needed unless otherwise documented below in the visit note. 

## 2015-08-13 NOTE — Progress Notes (Signed)
[name] [place],  [address] Phone: [phone] Subjective:    I'm seeing this patient by the request  of:    CC: left knee swelling  [id number] Alexander Fitzgerald is a 70 y.o. male coming in with complaint of left knee swelling.patient was having pain approximate 6 weeks ago and did see another provider. At that point he was diagnosed with a possible knee sprain. Patient was also given prednisone previously because of possibility of a gout flare. Patient states when he was taking the prednisone he didn't do much better. Over the course of time he has noticed that the swelling has started to increase. Sometimes has a catching sensation that feels like the knee can give out on him. Rates the severity of pain is more of 4 out of 10. Patient states overall he seems to be making slow improvements except for the swelling that seems to be worsening. Denies any numbness. Denies any weakness. Has not tried any other home modalities. Did try a compression sleeve for 2 days with no significant improvement.     Past Medical History  Diagnosis Date  . Asthma     adult onset, PFTs done in [place]  . Esophageal reflux   . Gout   . Hypertension   . Hyperlipidemia   . Depression     Treated with Prozac in 1992,  Associate with job loss  . Cancer ([place])     Several moles removed in past, all pre-cancerous.  Recently followed by Dr. Nehemiah Massed  . Hiatal hernia    Past Surgical History  Procedure Laterality Date  . Appendectomy    . Tonsillectomy    . Laparoscopic paraesophageal hernia repair    . Colonoscopy N/A 12/27/2014    Procedure: COLONOSCOPY;  Surgeon: Lollie Sails, MD;  Location[facility] ENDOSCOPY;  Service: Endoscopy;  Laterality: N/A;   Social History   Social History  . Marital Status: Married    Spouse Name: N/A  . Number of Children: N/A  . Years of Education: N/A   Social History Main Topics  . Smoking status: Former Smoker  -- 0.50 packs/day for 20 years    Types: Cigarettes    Quit date: 06/15/1999  . Smokeless tobacco: Never Used  . Alcohol Use: 0.6 oz/week    1 Standard drinks or equivalent per week     Comment: rarely  . Drug Use: No  . Sexual Activity: Yes   Other Topics Concern  . None   Social History Narrative   Allergies  Allergen Reactions  . Sulfa Drugs Cross Reactors     Blood in urine   Family History  Problem Relation Age of Onset  . Hypertension Mother   . Hyperlipidemia Mother   . Heart attack Father 75    MI  . Cancer Sister     Past medical history, social, surgical and family history all reviewed in electronic medical record.  No pertanent information unless stated regarding to the chief complaint.   Review of Systems: No headache, visual changes, nausea, vomiting, diarrhea, constipation, dizziness, abdominal pain, skin rash, fevers, chills, night sweats, weight loss, swollen lymph nodes, body aches, joint swelling, muscle aches, chest pain, shortness of breath, mood changes.   Objective Blood pressure 120/84, pulse 95, height 5\' 10"  (1.778 m), weight 219 lb (99.338 kg), SpO2 99 %.  General: No apparent distress alert and oriented x3 mood and affect normal, dressed appropriately.  HEENT: Pupils equal,  extraocular movements intact  Respiratory: [name] speak in full sentences and does not appear short of breath  Cardiovascular: No lower extremity edema, non tender, no erythema  Skin: Warm dry intact with no signs of infection or rash on extremities or on axial skeleton.  Abdomen: Soft nontender  Neuro: Cranial nerves II through XII are intact, neurovascularly intact in all extremities with 2+ DTRs and 2+ pulses.  Lymph: No lymphadenopathy of posterior or anterior cervical chain or axillae bilaterally.  Gait normal with good balance and coordination.  MSK:  Non tender with full range of motion and good stability and symmetric strength and tone of shoulders, elbows, wrist,  hip, and ankles bilaterally.  Knee:left Severe swelling and effusion of the joint noted Mild tenderness to the medial and lateral joint lines. Lacking the last 10 of flexion secondary to swelling Ligaments with solid consistent endpoints including ACL, PCL, LCL, MCL. Mild positiveMcmurray's, Apley's, and Thessalonian tests. Non painful patellar compression. Patellar glide with mildcrepitus. Patellar and quadriceps tendons unremarkable. Hamstring and quadriceps strength is normal.  Contralateral knee unremarkable  MSK US performed of: left knee This study was ordered, performed, and interpreted by Charlann Boxer D.O.  Knee: All structures visualized. Large effusion noted. Patient has what appears to be acute on chronic medial meniscal tear noted. Patient also has moderate osteophytic changes of the medial as well as patellofemoral joint.   Procedure: Real-time Ultrasound Guided Injection of left knee Device: GE Logiq E  Ultrasound guided injection is preferred based studies that show increased duration, increased effect, greater accuracy, decreased procedural pain, increased response rate, and decreased cost with ultrasound guided versus blind injection.  Verbal informed consent obtained.  Time-out conducted.  Noted no overlying erythema, induration, or other signs of local infection.  Skin prepped in a sterile fashion.  Local anesthesia: Topical Ethyl chloride.  With sterile technique and under real time ultrasound guidance: With a 22-gauge 2 inch needle patient was injected with 4 cc of 0.5% Marcaine andaspirated 50 mL of strawlike colored fluid. Then injected 1 cc of Kenalog 40 mg/dL. This was from a superior lateral approach.  Completed without difficulty  Pain immediately resolved suggesting accurate placement of the medication.  Advised to call if fevers/chills, erythema, induration, drainage, or persistent bleeding.  Images permanently stored and available for review in the  ultrasound unit.  Impression: Technically successful ultrasound guided injection.  I  Impression and Recommendations:     This case required medical decision making of moderate complexity.      Note: This dictation was prepared with Dragon dictation along with smaller phrase technology. Any transcriptional errors that result from this process are unintentional.

## 2015-08-14 LAB — SYNOVIAL CELL COUNT + DIFF, W/ CRYSTALS
Basophils, %: 0 %
Eosinophils-Synovial: 0 % (ref 0–2)
LYMPHOCYTES-SYNOVIAL FLD: 23 % (ref 0–74)
Monocyte/Macrophage: 71 % — ABNORMAL HIGH (ref 0–69)
NEUTROPHIL, SYNOVIAL: 4 % (ref 0–24)
Synoviocytes, %: 2 % (ref 0–15)
WBC, Synovial: 110 cells/uL (ref <150)

## 2015-08-14 LAB — URIC ACID, SYNOVIAL FLUID: Uric Acid, Synovial Fluid: 6.2 mg/dL (ref 0.0–8.0)

## 2015-08-27 ENCOUNTER — Encounter: Payer: Self-pay | Admitting: Family Medicine

## 2015-08-27 ENCOUNTER — Ambulatory Visit (INDEPENDENT_AMBULATORY_CARE_PROVIDER_SITE_OTHER): Payer: Medicare PPO | Admitting: Family Medicine

## 2015-08-27 VITALS — BP 132/84 | HR 93 | Ht 70.0 in | Wt 217.0 lb

## 2015-08-27 DIAGNOSIS — M129 Arthropathy, unspecified: Secondary | ICD-10-CM

## 2015-08-27 DIAGNOSIS — M1A0710 Idiopathic chronic gout, right ankle and foot, without tophus (tophi): Secondary | ICD-10-CM

## 2015-08-27 DIAGNOSIS — S83242A Other tear of medial meniscus, current injury, left knee, initial encounter: Secondary | ICD-10-CM

## 2015-08-27 DIAGNOSIS — M1712 Unilateral primary osteoarthritis, left knee: Secondary | ICD-10-CM

## 2015-08-27 NOTE — Assessment & Plan Note (Signed)
Patient continues to have an meniscal tear. There is one on the medial aspect as well as one on the lateral aspect. This was seen on ultrasound today. At this point patient continues to have the effusion the patient does not want any surgical intervention. Because of this I do not feel advance imaging is warranted. Patient will start though with formal physical therapy. We discussed icing regimen and home exercise. Discussed which activities to do in which ones to avoid. Patient return again in 4-6 weeks

## 2015-08-27 NOTE — Assessment & Plan Note (Signed)
Moderate nature will continue to monitor.

## 2015-08-27 NOTE — Progress Notes (Signed)
[name] [place], [place] [address] Phone: [phone] Subjective:     CC: left knee swelling f/u  [id number] Alexander Fitzgerald is a 70 y.o. male coming in with complaint of left knee Swelling. Patient was seen previously and did have an effusion of the left knee. In addition of this patient did have some underlying moderate osteophytic changes. Patient did have x-rays after last exam that was independently visualized by me. X-rays of the left knee does show mild to moderate osteophytic changes that are consistent with what was found on the ultrasound. Patient was to do bracing, home exercise, topical anti-inflammatories and icing. Patient states Swelling as well as pain is significant improved. Continues to have some mild discomfort. Nothing that seems to be a severe though. Patient states sometimes a twisting motion gives him a little bit of pain. No longer taking anti-inflammatories on a regular basis. Compression sleeve has been helpful. No locking or giving out on him.     Past Medical History  Diagnosis Date  . Asthma     adult onset, PFTs done in [place]  . Esophageal reflux   . Gout   . Hypertension   . Hyperlipidemia   . Depression     Treated with Prozac in 1992,  Associate with job loss  . Cancer ([place])     Several moles removed in past, all pre-cancerous.  Recently followed by Dr. Nehemiah Massed  . Hiatal hernia    Past Surgical History  Procedure Laterality Date  . Appendectomy    . Tonsillectomy    . Laparoscopic paraesophageal hernia repair    . Colonoscopy N/A 12/27/2014    Procedure: COLONOSCOPY;  Surgeon: Lollie Sails, MD;  Location: [facility] ENDOSCOPY;  Service: Endoscopy;  Laterality: N/A;   Social History   Social History  . Marital Status: Married    Spouse Name: N/A  . Number of Children: N/A  . Years of Education: N/A   Social History Main Topics  . Smoking status: Former Smoker -- 0.50 packs/day for 20 years     Types: Cigarettes    Quit date: 06/15/1999  . Smokeless tobacco: Never Used  . Alcohol Use: 0.6 oz/week    1 Standard drinks or equivalent per week     Comment: rarely  . Drug Use: No  . Sexual Activity: Yes   Other Topics Concern  . None   Social History Narrative   Allergies  Allergen Reactions  . Sulfa Drugs Cross Reactors     Blood in urine   Family History  Problem Relation Age of Onset  . Hypertension Mother   . Hyperlipidemia Mother   . Heart attack Father 96    MI  . Cancer Sister     Past medical history, social, surgical and family history all reviewed in electronic medical record.  No pertanent information unless stated regarding to the chief complaint.   Review of Systems: No headache, visual changes, nausea, vomiting, diarrhea, constipation, dizziness, abdominal pain, skin rash, fevers, chills, night sweats, weight loss, swollen lymph nodes, body aches, joint swelling, muscle aches, chest pain, shortness of breath, mood changes.   Objective Blood pressure 132/84, pulse 93, height 5\' 10"  (1.778 m), weight 217 lb (98.431 kg), SpO2 96 %.  General: No apparent distress alert and oriented x3 mood and affect normal, dressed appropriately.  HEENT: Pupils equal, extraocular movements intact  Respiratory: [name] speak in full sentences and does not appear short of breath  Cardiovascular: No lower extremity edema, non tender, no erythema  Skin: Warm dry intact with no signs of infection or rash on extremities or on axial skeleton.  Abdomen: Soft nontender  Neuro: Cranial nerves II through XII are intact, neurovascularly intact in all extremities with 2+ DTRs and 2+ pulses.  Lymph: No lymphadenopathy of posterior or anterior cervical chain or axillae bilaterally.  Gait normal with good balance and coordination.  MSK:  Non tender with full range of motion and good stability and symmetric strength and tone of shoulders, elbows, wrist, hip, and ankles bilaterally.    Knee:left Continued effusion noted but improved Mild tenderness to the medial and lateral joint lines. Full range of motion Ligaments with solid consistent endpoints including ACL, PCL, LCL, MCL. Mild positiveMcmurray's, Apley's, and Thessalonian tests. Non painful patellar compression. Patellar glide with mildcrepitus. Patellar and quadriceps tendons unremarkable. Hamstring and quadriceps strength is normal.  Contralateral knee unremarkable  MSK US performed of: left knee This study was ordered, performed, and interpreted by Charlann Boxer D.O.  Knee: All structures visualized. Joint effusion still noted. Meniscal tear noted on the lateral as well as medial aspects. 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      Note: This dictation was prepared with Dragon dictation along with smaller phrase technology. Any transcriptional errors that result from this process are unintentional.

## 2015-08-27 NOTE — Assessment & Plan Note (Signed)
[name] aspiration at last exam did not show that this was a gout flare.

## 2015-08-27 NOTE — Patient Instructions (Signed)
Good to see you  Ice is your friend after activity  pennsaid pinkie amount topically 2 times daily as needed.  Avoid twisting motions.  Physical therapy will be calling me.  See me again in 4-6 weeks.

## 2015-08-27 NOTE — Progress Notes (Signed)
Pre visit review using our clinic review tool, if applicable. No additional management support is needed unless otherwise documented below in the visit note. 

## 2015-09-14 ENCOUNTER — Other Ambulatory Visit: Payer: Self-pay | Admitting: Internal Medicine

## 2015-09-16 NOTE — Telephone Encounter (Signed)
Proair RX refill request. Last seen JAN 2017.

## 2015-10-01 ENCOUNTER — Encounter: Payer: Self-pay | Admitting: Family Medicine

## 2015-10-01 ENCOUNTER — Ambulatory Visit (INDEPENDENT_AMBULATORY_CARE_PROVIDER_SITE_OTHER): Payer: Medicare PPO | Admitting: Family Medicine

## 2015-10-01 VITALS — BP 142/88 | HR 104 | Ht 70.0 in | Wt 209.0 lb

## 2015-10-01 DIAGNOSIS — M129 Arthropathy, unspecified: Secondary | ICD-10-CM | POA: Diagnosis not present

## 2015-10-01 DIAGNOSIS — S83242D Other tear of medial meniscus, current injury, left knee, subsequent encounter: Secondary | ICD-10-CM

## 2015-10-01 DIAGNOSIS — M1712 Unilateral primary osteoarthritis, left knee: Secondary | ICD-10-CM

## 2015-10-01 NOTE — Assessment & Plan Note (Signed)
Patient seems to be doing very well. Does have some underlying arthritis and may have some exacerbation from time to time. Discussed with him we can do injections if necessary. Patient could also benefit from an MRI for further evaluation if patient has locking of any internal derangement type signs. His lungs patient continues to do well and can follow-up as needed. Did complete formal physical therapy at this point.

## 2015-10-01 NOTE — Progress Notes (Signed)
Pre visit review using our clinic review tool, if applicable. No additional management support is needed unless otherwise documented below in the visit note. 

## 2015-10-01 NOTE — Progress Notes (Signed)
[facility] [place], [place] [address] Phone: [phone] Subjective:     CC: left knee swelling f/u  [id number] Alexander Fitzgerald is a 70 y.o. male coming in with complaint of left knee Swelling. Patient was seen previously and did have an effusion of the left knee. In addition of this patient did have some underlying moderate osteophytic changes at a meniscal tear. Patient has been in formal physical therapy. Patient states that he is 90% better. States that it is not locking or giving out on him. Patient states overall he seems to be making significant improvement and can do daily activities without any significant pain. Not taking any anti-inflammatories. States that the swelling has completely resolved. Happy with the results of far.    Past Medical History  Diagnosis Date  . Asthma     adult onset, PFTs done in [place]  . Esophageal reflux   . Gout   . Hypertension   . Hyperlipidemia   . Depression     Treated with Prozac in 1992,  Associate with job loss  . Cancer ([place])     Several moles removed in past, all pre-cancerous.  Recently followed by Dr. Nehemiah Massed  . Hiatal hernia    Past Surgical History  Procedure Laterality Date  . Appendectomy    . Tonsillectomy    . Laparoscopic paraesophageal hernia repair    . Colonoscopy N/A 12/27/2014    Procedure: COLONOSCOPY;  Surgeon: Lollie Sails, MD;  Location: [facility] ENDOSCOPY;  Service: Endoscopy;  Laterality: N/A;   Social History   Social History  . Marital Status: Married    Spouse Name: N/A  . Number of Children: N/A  . Years of Education: N/A   Social History Main Topics  . Smoking status: Former Smoker -- 0.50 packs/day for 20 years    Types: Cigarettes    Quit date: 06/15/1999  . Smokeless tobacco: Never Used  . Alcohol Use: 0.6 oz/week    1 Standard drinks or equivalent per week     Comment: rarely  . Drug Use: No  . Sexual Activity: Yes   Other Topics Concern    . None   Social History Narrative   Allergies  Allergen Reactions  . Sulfa Drugs Cross Reactors     Blood in urine   Family History  Problem Relation Age of Onset  . Hypertension Mother   . Hyperlipidemia Mother   . Heart attack Father 44    MI  . Cancer Sister     Past medical history, social, surgical and family history all reviewed in electronic medical record.  No pertanent information unless stated regarding to the chief complaint.   Review of Systems: No headache, visual changes, nausea, vomiting, diarrhea, constipation, dizziness, abdominal pain, skin rash, fevers, chills, night sweats, weight loss, swollen lymph nodes, body aches, joint swelling, muscle aches, chest pain, shortness of breath, mood changes.   Objective Blood pressure 142/88, pulse 104, height 5\' 10"  (1.778 m), weight 209 lb (94.802 kg), SpO2 96 %.  General: No apparent distress alert and oriented x3 mood and affect normal, dressed appropriately.  HEENT: Pupils equal, extraocular movements intact  Respiratory: [name] speak in full sentences and does not appear short of breath  Cardiovascular: No lower extremity edema, non tender, no erythema  Skin: Warm dry intact with no signs of infection or rash on extremities or on axial skeleton.  Abdomen: Soft nontender  Neuro: Cranial nerves II through  XII are intact, neurovascularly intact in all extremities with 2+ DTRs and 2+ pulses.  Lymph: No lymphadenopathy of posterior or anterior cervical chain or axillae bilaterally.  Gait normal with good balance and coordination.  MSK:  Non tender with full range of motion and good stability and symmetric strength and tone of shoulders, elbows, wrist, hip, and ankles bilaterally.  Knee:left No effusion noted nontender today on exam Full range of motion Ligaments with solid consistent endpoints including ACL, PCL, LCL, MCL. negativeMcmurray's, Apley's, and Thessalonian tests. Non painful patellar  compression. Patellar glide with mildcrepitus. Patellar and quadriceps tendons unremarkable. Hamstring and quadriceps strength is normal.  Contralateral knee unremarkable      I  Impression and Recommendations:     This case required medical decision making of moderate complexity.      Note: This dictation was prepared with Dragon dictation along with smaller phrase technology. Any transcriptional errors that result from this process are unintentional.

## 2015-10-06 ENCOUNTER — Other Ambulatory Visit: Payer: Self-pay | Admitting: Internal Medicine

## 2015-11-10 ENCOUNTER — Ambulatory Visit: Payer: Medicare PPO | Admitting: Internal Medicine

## 2015-11-21 ENCOUNTER — Ambulatory Visit: Payer: Medicare PPO | Admitting: Family Medicine

## 2015-11-26 NOTE — Progress Notes (Unsigned)
Subjective:   Alexander Fitzgerald is a 70 y.o. male who presents for an Initial Medicare Annual Wellness Visit.  Review of Systems    ***        Objective:    Today's Vitals   05/30/15 0857  BP: 118/62   There is no height or weight on file to calculate BMI.   Current Medications (verified) Outpatient Encounter Prescriptions as of 05/30/2015  Medication Sig  . Ascorbic Acid (VITAMIN C) 1000 MG tablet Take 1,000 mg by mouth daily. Reported on 06/26/2015  . aspirin 81 MG chewable tablet Chew by mouth daily.  . Cyanocobalamin (VITAMIN B-12) 5000 MCG SUBL Place 2 each under the tongue daily.    Marland Kitchen docusate sodium (COLACE) 100 MG capsule Take 100 mg by mouth daily.  Marland Kitchen doxycycline (VIBRAMYCIN) 50 MG capsule Take 50 mg by mouth daily.   . folic acid (FOLVITE) Q000111Q MCG tablet Take 800 mcg by mouth daily.   Marland Kitchen gentamicin ointment (GARAMYCIN) 0.1 % Apply 1 application topically 2 (two) times daily.  Marland Kitchen losartan (COZAAR) 25 MG tablet Take 1 tablet (25 mg total) by mouth daily.  . montelukast (SINGULAIR) 10 MG tablet TAKE 1 TABLET BY MOUTH AT BEDTIME  . Multiple Vitamin (MULTIVITAMIN) capsule Take 1 capsule by mouth daily.    . Pyridoxine HCl (VITAMIN B-6 CR) 200 MG TBCR Take by mouth.    . [DISCONTINUED] albuterol (PROAIR HFA) 108 (90 BASE) MCG/ACT inhaler Inhale 2 puffs into the lungs 4 (four) times daily as needed. (Patient not taking: Reported on 06/26/2015)  . [DISCONTINUED] finasteride (PROSCAR) 5 MG tablet TAKE 1 TABLET BY MOUTH EVERY DAY  . [DISCONTINUED] Fluticasone-Salmeterol (ADVAIR DISKUS) 250-50 MCG/DOSE AEPB INHALE 1 PUFF BY MOUTH AS NEEDED. (Patient not taking: Reported on 06/26/2015)  . [DISCONTINUED] pantoprazole (PROTONIX) 40 MG tablet TAKE 1 TABLET BY MOUTH EVERY DAY -- 1 HOUR BEFORE A MEAL AS DIRECTED   No facility-administered encounter medications on file as of 05/30/2015.     Allergies (verified) Sulfa drugs cross reactors   History: Past Medical History:  Diagnosis  Date  . Asthma    adult onset, PFTs done in [place]  . Cancer ([place])    Several moles removed in past, all pre-cancerous.  Recently followed by Dr. Nehemiah Massed  . Depression    Treated with Prozac in 1992,  Associate with job loss  . Esophageal reflux   . Gout   . Hiatal hernia   . Hyperlipidemia   . Hypertension    Past Surgical History:  Procedure Laterality Date  . APPENDECTOMY    . COLONOSCOPY N/A 12/27/2014   Procedure: COLONOSCOPY;  Surgeon: Lollie Sails, MD;  Location: [facility] ENDOSCOPY;  Service: Endoscopy;  Laterality: N/A;  . LAPAROSCOPIC PARAESOPHAGEAL HERNIA REPAIR    . TONSILLECTOMY     Family History  Problem Relation Age of Onset  . Hypertension Mother   . Hyperlipidemia Mother   . Heart attack Father 29    MI  . Cancer Sister    Social History   Occupational History  . Not on file.   Social History Main Topics  . Smoking status: Former Smoker    Packs/day: 0.50    Years: 20.00    Types: Cigarettes    Quit date: 06/15/1999  . Smokeless tobacco: Never Used  . Alcohol use 0.6 oz/week    1 Standard drinks or equivalent per week     Comment: rarely  . Drug use: No  . Sexual activity: Yes  Tobacco Counseling Counseling given: Not Answered   Activities of Daily Living In your present state of health, do you have any difficulty performing the following activities: 05/05/2015  Hearing? N  Vision? N  Difficulty concentrating or making decisions? N  Walking or climbing stairs? N  Dressing or bathing? N  Doing errands, shopping? N  Preparing Food and eating ? N  Using the Toilet? N  In the past six months, have you accidently leaked urine? N  Do you have problems with loss of bowel control? N  Managing your Medications? N  Managing your Finances? N  Housekeeping or managing your Housekeeping? N  Some recent data might be hidden    Immunizations and Health Maintenance Immunization History  Administered Date(s) Administered  . Influenza Split  03/03/2013  . Influenza,inj,Quad PF,36+ Mos 01/16/2014  . Influenza-Unspecified 01/25/2012, 02/18/2015  . Pneumococcal Conjugate-13 06/04/2013  . Pneumococcal Polysaccharide-23 10/25/2008, 05/05/2015  . Tdap 04/26/2009  . Zoster 03/03/2013   Health Maintenance Due  Topic Date Due  . INFLUENZA VACCINE  11/18/2015    Patient Care Team: Jackolyn Confer, MD as PCP - General (Internal Medicine)  Indicate any recent Medical Services you may have received from other than [facility] providers in the past year (date may be approximate).     Assessment:   This is a routine wellness examination for [place]. ***  Hearing/Vision screen No exam data present  Dietary issues and exercise activities discussed:    Goals    . Healthy Lifestyle          Continue exercise regiment of walking Make healthy food choices Drink plenty of water      Depression Screen PHQ 2/9 Scores 05/05/2015 04/15/2014 12/04/2012 04/26/2012  PHQ - 2 Score 0 0 0 0    Fall Risk Fall Risk  05/05/2015 04/15/2014 12/04/2012 04/26/2012  Falls in the past year? No No No No    Cognitive Function: MMSE - Mini Mental State Exam 05/05/2015  Orientation to time 5  Orientation to Place 5  Registration 3  Attention/ Calculation 5  Recall 3  Language- name 2 objects 2  Language- repeat 1  Language- follow 3 step command 3  Language- read & follow direction 1  Write a sentence 1  Copy design 1  Total score 30    Screening Tests Health Maintenance  Topic Date Due  . INFLUENZA VACCINE  11/18/2015  . TETANUS/TDAP  04/27/2019  . COLONOSCOPY  12/26/2024  . ZOSTAVAX  Completed  . Hepatitis C Screening  Completed  . PNA vac Low Risk Adult  Completed      Plan:   ***  During the course of the visit, Yaphet was educated and counseled about the following appropriate screening and preventive services:   Vaccines to include Pneumoccal, Influenza, Hepatitis B, Td, Zostavax, HCV  Electrocardiogram  Cardiovascular  disease screening  Colorectal cancer screening  Bone density screening  Diabetes screening  Glaucoma screening  Mammography/PAP  Nutrition counseling  Smoking cessation counseling  Patient Instructions (the written plan) were given to the patient.    Izora Ribas Loye Reininger, RN   11/26/2015

## 2015-11-27 ENCOUNTER — Encounter: Payer: Self-pay | Admitting: Family Medicine

## 2015-11-27 ENCOUNTER — Ambulatory Visit (INDEPENDENT_AMBULATORY_CARE_PROVIDER_SITE_OTHER): Payer: Medicare PPO | Admitting: Family Medicine

## 2015-11-27 VITALS — BP 130/89 | HR 91 | Temp 98.4°F | Wt 204.4 lb

## 2015-11-27 DIAGNOSIS — G4733 Obstructive sleep apnea (adult) (pediatric): Secondary | ICD-10-CM

## 2015-11-27 DIAGNOSIS — R7989 Other specified abnormal findings of blood chemistry: Secondary | ICD-10-CM | POA: Diagnosis not present

## 2015-11-27 DIAGNOSIS — L719 Rosacea, unspecified: Secondary | ICD-10-CM | POA: Insufficient documentation

## 2015-11-27 DIAGNOSIS — I10 Essential (primary) hypertension: Secondary | ICD-10-CM | POA: Diagnosis not present

## 2015-11-27 DIAGNOSIS — K219 Gastro-esophageal reflux disease without esophagitis: Secondary | ICD-10-CM | POA: Diagnosis not present

## 2015-11-27 DIAGNOSIS — Z9989 Dependence on other enabling machines and devices: Secondary | ICD-10-CM

## 2015-11-27 NOTE — Patient Instructions (Signed)
Continue your current medications.  Follow up annually.  Call/My chart with concerns.  Take care  Dr. Lacinda Axon

## 2015-11-27 NOTE — Assessment & Plan Note (Signed)
TSH, Free T4 and Free T3 today.

## 2015-11-27 NOTE — Progress Notes (Signed)
Subjective:  Patient ID: Alexander Fitzgerald, male    DOB: 1945/11/17  Age: 70 y.o. MRN: [id number]  CC: Follow up, establish care with me.  HPI:  70 year old male with hypertension, gout, OSA presents for follow-up.  HTN  Stable. Well controlled.  Compliant with losartan 25 mg daily.  OSA  Stable. Compliant with CPAP.  GERD  Well controlled following paraesophageal hernia repair.  Doing well on protonix.    Abnormal TSH  Discovered on review of recent labs.  Patient asymptomatic.  Will discuss repeating labs today.  Social Hx   Social History   Social History  . Marital status: Married    Spouse name: N/A  . Number of children: N/A  . Years of education: N/A   Social History Main Topics  . Smoking status: Former Smoker    Packs/day: 0.50    Years: 20.00    Types: Cigarettes    Quit date: 06/15/1999  . Smokeless tobacco: Never Used  . Alcohol use 0.6 oz/week    1 Standard drinks or equivalent per week     Comment: rarely  . Drug use: No  . Sexual activity: Yes   Other Topics Concern  . Not on file   Social History Narrative  . No narrative on file   Review of Systems  Constitutional: Negative.   Gastrointestinal: Negative.    Objective:  BP 130/89 (BP Location: Left Arm, Patient Position: Sitting, Cuff Size: Normal)   Pulse 91   Temp 98.4 F (36.9 C) (Oral)   Wt 204 lb 6 oz (92.7 kg)   SpO2 96%   BMI 29.32 kg/m   BP/Weight 11/27/2015 10/01/2015 [id number]  Systolic BP [id number]  Diastolic BP 89 88 84  Wt. (Lbs) 204.38 209 217  BMI 29.32 29.99 31.14   Physical Exam  Constitutional: He is oriented to person, place, and time. He appears well-developed. No distress.  Cardiovascular: Normal rate and regular rhythm.   Pulmonary/Chest: Effort normal and breath sounds normal. He has no wheezes. He has no rales.  Abdominal: Soft. He exhibits no distension. There is no tenderness.  Neurological: He is alert and oriented to person, place, and  time.  Psychiatric: He has a normal mood and affect.  Vitals reviewed.   Lab Results  Component Value Date   WBC 5.8 05/12/2015   HGB 15.9 05/12/2015   HCT 47.6 05/12/2015   PLT 242.0 05/12/2015   GLUCOSE 97 05/12/2015   CHOL 145 05/12/2015   TRIG 99.0 05/12/2015   HDL 48.10 05/12/2015   LDLCALC 77 05/12/2015   ALT 18 05/12/2015   AST 17 05/12/2015   NA 141 05/12/2015   K 4.4 05/12/2015   CL 105 05/12/2015   CREATININE 1.03 05/12/2015   BUN 16 05/12/2015   CO2 27 05/12/2015   TSH 0.32 (L) 05/12/2015   PSA 0.31 05/12/2015   INR 0.93 01/13/2011   MICROALBUR 0.8 04/15/2014    Assessment & Plan:   Problem List Items Addressed This Visit    Abnormal TSH    TSH, Free T4 and Free T3 today.      Relevant Orders   TSH   T4, free   T3, free   GERD (gastroesophageal reflux disease)    Stable. Recommended trial off Protonix.      Hypertension - Primary    Well controlled. Continue Losartan.      OSA on CPAP    Stable. Compliant with CPAP.       Other  Visit Diagnoses   None.    Follow-up: Annually.  [place]

## 2015-11-27 NOTE — Assessment & Plan Note (Signed)
Stable. Recommended trial off Protonix.

## 2015-11-27 NOTE — Assessment & Plan Note (Signed)
Stable. Compliant with CPAP

## 2015-11-27 NOTE — Progress Notes (Signed)
Pre visit review using our clinic review tool, if applicable. No additional management support is needed unless otherwise documented below in the visit note. 

## 2015-11-27 NOTE — Assessment & Plan Note (Signed)
Well controlled. Continue Losartan.

## 2015-11-28 LAB — T3, FREE: T3, Free: 3.2 pg/mL (ref 2.3–4.2)

## 2015-11-28 LAB — T4, FREE: FREE T4: 0.82 ng/dL (ref 0.60–1.60)

## 2015-11-28 LAB — TSH: TSH: 0.38 u[IU]/mL (ref 0.35–4.50)

## 2015-12-18 ENCOUNTER — Other Ambulatory Visit: Payer: Self-pay

## 2015-12-18 DIAGNOSIS — Z8249 Family history of ischemic heart disease and other diseases of the circulatory system: Secondary | ICD-10-CM

## 2016-01-15 ENCOUNTER — Ambulatory Visit (INDEPENDENT_AMBULATORY_CARE_PROVIDER_SITE_OTHER)
Admission: RE | Admit: 2016-01-15 | Discharge: 2016-01-15 | Disposition: A | Discharge status: Home / Self Care | Payer: Self-pay | Source: Ambulatory Visit | Attending: Cardiovascular Disease | Admitting: Cardiovascular Disease

## 2016-01-15 DIAGNOSIS — Z8249 Family history of ischemic heart disease and other diseases of the circulatory system: Secondary | ICD-10-CM

## 2016-01-19 ENCOUNTER — Telehealth: Payer: Self-pay | Admitting: Cardiovascular Disease

## 2016-01-19 NOTE — Telephone Encounter (Signed)
Final results are not available yet. Will call once they have been resulted.

## 2016-01-19 NOTE — Telephone Encounter (Signed)
Pt would like results of calcium score test performed last week. Please call and advise.

## 2016-01-27 ENCOUNTER — Other Ambulatory Visit: Payer: Self-pay | Admitting: Family Medicine

## 2016-01-27 MED ORDER — MONTELUKAST SODIUM 10 MG PO TABS: 10.0000 mg | ORAL_TABLET | Freq: Every day | ORAL | 1 refills | Status: DC

## 2016-01-30 ENCOUNTER — Encounter: Payer: Self-pay | Admitting: Family Medicine

## 2016-02-05 ENCOUNTER — Other Ambulatory Visit: Payer: Self-pay | Admitting: Cardiovascular Disease

## 2016-02-05 DIAGNOSIS — Z136 Encounter for screening for cardiovascular disorders: Secondary | ICD-10-CM

## 2016-02-06 ENCOUNTER — Ambulatory Visit (INDEPENDENT_AMBULATORY_CARE_PROVIDER_SITE_OTHER): Payer: Medicare PPO

## 2016-02-06 DIAGNOSIS — Z136 Encounter for screening for cardiovascular disorders: Secondary | ICD-10-CM

## 2016-02-26 ENCOUNTER — Encounter: Payer: Self-pay | Admitting: Cardiovascular Disease

## 2016-02-26 ENCOUNTER — Ambulatory Visit (INDEPENDENT_AMBULATORY_CARE_PROVIDER_SITE_OTHER): Payer: Medicare PPO | Admitting: Cardiovascular Disease

## 2016-02-26 VITALS — BP 150/88 | HR 93 | Ht 69.0 in | Wt 205.5 lb

## 2016-02-26 DIAGNOSIS — I10 Essential (primary) hypertension: Secondary | ICD-10-CM | POA: Diagnosis not present

## 2016-02-26 DIAGNOSIS — E785 Hyperlipidemia, unspecified: Secondary | ICD-10-CM

## 2016-02-26 DIAGNOSIS — I2510 Atherosclerotic heart disease of native coronary artery without angina pectoris: Secondary | ICD-10-CM

## 2016-02-26 DIAGNOSIS — I4510 Unspecified right bundle-branch block: Secondary | ICD-10-CM | POA: Diagnosis not present

## 2016-02-26 DIAGNOSIS — G4733 Obstructive sleep apnea (adult) (pediatric): Secondary | ICD-10-CM

## 2016-02-26 DIAGNOSIS — Z9989 Dependence on other enabling machines and devices: Secondary | ICD-10-CM

## 2016-02-26 DIAGNOSIS — E7800 Pure hypercholesterolemia, unspecified: Secondary | ICD-10-CM | POA: Insufficient documentation

## 2016-02-26 DIAGNOSIS — E669 Obesity, unspecified: Secondary | ICD-10-CM

## 2016-02-26 NOTE — Patient Instructions (Signed)
Medication Instructions:   No medication changes made  Labwork:  We will order fasting cholesterol at you convenience  Testing/Procedures:  No further testing at this time   Follow-Up: It was a pleasure seeing you in the office today. Please call us if you have new issues that need to be addressed before your next appt.  [phone]  Your physician wants you to follow-up in: 12 months.  You will receive a reminder letter in the mail two months in advance. If you don't receive a letter, please call our office to schedule the follow-up appointment.  If you need a refill on your cardiac medications before your next appointment, please call your pharmacy.

## 2016-02-26 NOTE — Progress Notes (Signed)
Cardiology Office Note  Date:  02/26/2016   ID:  Alexander Fitzgerald, DOB 19-Feb-1946, MRN [id number]  PCP:  Coral Spikes, DO   Chief Complaint  Patient presents with  . other    Last seen in 2013. Pt is here to establish care to discuss recent CT cardiac score and vascular study.     HPI:  Alexander Fitzgerald is a very pleasant 70 year old gentleman with a 20 year smoking history, mild asthma exacerbated by upper respiratory infections, hypertension, gout, plantar fasciitis, previously seen by cardiology in 2014 for shortness of breath, who presents by self referral for discussion of his elevated CT coronary calcium score and discussion of his cardiac risk factors.  In general he reports that he has been doing well,  Denies any chest pain or significant shortness of breath on exertion Has a new young dog, walks the dog several miles per day Tries to do 10,000 steps per day Is on Weight Watchers, weight is down 10 pounds Lots of traveling Recently went on a river boat in [place]  Previous history discussed with him including S/p para-esophageal hernia surgery in [place] mesh placed, long recovery  OSA, on CPAP, sleeps well  Recent CT scan chest reviewed with him Coronary calcium score of 106. This was 52nd percentile for age and sex matched control. LAD prox and mid calcium Images reviewed with him in detail, pulled up in the office  Lab work discussed with him Total chol 145, LDL 77, in May 2017 On no statin Previous total cholesterol 170 up to 180, LDL 100  EKG on today's visit shows normal sinus rhythm with rate 93 bpm, right bundle branch block   PMH:   has a past medical history of Asthma; Cancer ([place]); Depression; Esophageal reflux; Gout; Hiatal hernia; and Hypertension.  PSH:    Past Surgical History:  Procedure Laterality Date  . APPENDECTOMY    . COLONOSCOPY N/A 12/27/2014   Procedure: COLONOSCOPY;  Surgeon: Lollie Sails, MD;  Location: [facility] ENDOSCOPY;   Service: Endoscopy;  Laterality: N/A;  . LAPAROSCOPIC PARAESOPHAGEAL HERNIA REPAIR    . TONSILLECTOMY      Current Outpatient Prescriptions  Medication Sig Dispense Refill  . Ascorbic Acid (VITAMIN C) 1000 MG tablet Take 1,000 mg by mouth daily. Reported on 06/26/2015    . aspirin 81 MG chewable tablet Chew by mouth daily.    . Cyanocobalamin (VITAMIN B-12) 5000 MCG SUBL Place 2 each under the tongue daily.      Marland Kitchen docusate sodium (COLACE) 100 MG capsule Take 100 mg by mouth daily.    Marland Kitchen doxycycline (VIBRAMYCIN) 50 MG capsule Take 50 mg by mouth daily.     . finasteride (PROSCAR) 5 MG tablet TAKE 1 TABLET BY MOUTH EVERY DAY 30 tablet 4  . folic acid (FOLVITE) Q000111Q MCG tablet Take 800 mcg by mouth daily.     Marland Kitchen losartan (COZAAR) 25 MG tablet Take 1 tablet (25 mg total) by mouth daily. 90 tablet 3  . montelukast (SINGULAIR) 10 MG tablet Take 1 tablet (10 mg total) by mouth at bedtime. 90 tablet 1  . Multiple Vitamin (MULTIVITAMIN) capsule Take 1 capsule by mouth daily.      . pantoprazole (PROTONIX) 40 MG tablet TAKE 1 TABLET BY MOUTH EVERY DAY -- 1 HOUR BEFORE A MEAL AS DIRECTED 30 tablet 5  . PROAIR HFA 108 (90 Base) MCG/ACT inhaler TAKE 2 PUFFS 4 TIMES A DAY AS NEEDED 8.5 Inhaler 1  . Pyridoxine HCl (  VITAMIN B-6 CR) 200 MG TBCR Take by mouth.       No current facility-administered medications for this visit.      Allergies:   Sulfa drugs cross reactors   Social History:  The patient  reports that he quit smoking about 16 years ago. His smoking use included Cigarettes. He has a 10.00 pack-year smoking history. He has never used smokeless tobacco. He reports that he drinks about 0.6 oz of alcohol per week . He reports that he does not use drugs.   Family History:   family history includes Cancer in his sister; Heart attack (age of onset: 67) in his father; Hyperlipidemia in his mother; Hypertension in his mother.    Review of Systems: Review of Systems  Constitutional: Negative.    Respiratory: Negative.   Cardiovascular: Negative.   Gastrointestinal: Negative.   Musculoskeletal: Negative.   Neurological: Negative.   Psychiatric/Behavioral: Negative.   All other systems reviewed and are negative.    PHYSICAL EXAM: VS:  BP (!) 150/88 (BP Location: Right Arm, Patient Position: Sitting, Cuff Size: Normal)   Pulse 93   Ht 5\' 9"  (1.753 m)   Wt 205 lb 8 oz (93.2 kg)   BMI 30.35 kg/m  , BMI Body mass index is 30.35 kg/m. GEN: Well nourished, well developed, in no acute distress, obese  HEENT: normal  Neck: no JVD, carotid bruits, or masses Cardiac: RRR; no murmurs, rubs, or gallops,no edema  Respiratory:  clear to auscultation bilaterally, normal work of breathing GI: soft, nontender, nondistended, + BS MS: no deformity or atrophy  Skin: warm and dry, no rash Neuro:  Strength and sensation are intact Psych: euthymic mood, full affect    Recent Labs: 05/12/2015: ALT 18; BUN 16; Creatinine, Ser 1.03; Hemoglobin 15.9; Platelets 242.0; Potassium 4.4; Sodium 141 11/27/2015: TSH 0.38    Lipid Panel Lab Results  Component Value Date   CHOL 145 05/12/2015   HDL 48.10 05/12/2015   LDLCALC 77 05/12/2015   TRIG 99.0 05/12/2015      Wt Readings from Last 3 Encounters:  02/26/16 205 lb 8 oz (93.2 kg)  11/27/15 204 lb 6 oz (92.7 kg)  10/01/15 209 lb (94.8 kg)       ASSESSMENT AND PLAN:  Essential hypertension - Plan: EKG 12-Lead, Hepatic function panel, Lipid Profile Blood pressure mildly elevated, recommended he monitor numbers at home If they continue to run high, losartan could be increased  RBBB (right bundle branch block) - Plan: EKG 12-Lead, Hepatic function panel, Lipid Profile No further workup needed at this time  Hyperlipidemia, unspecified hyperlipidemia type - Plan: Hepatic function panel, Lipid Profile Recommended continued weight loss, recheck lipid panel now that he is been working with Weight Watchers Goal LDL less than 70 given  underlying coronary calcifications He is not particularly eager to start a statin unless numbers climb  Coronary artery disease involving native coronary artery of native heart without angina pectoris Images reviewed with him, proximal to mid LAD mild coronary disease Minimal if any aortic atherosclerosis noted We discussed anginal symptoms to watch for  Obesity (BMI 30-39.9) We have encouraged continued exercise, careful diet management in an effort to lose weight.  OSA on CPAP Tolerating his CPAP.  Encouraged continued weight loss   Total encounter time more than 45 minutes  Greater than 50% was spent in counseling and coordination of care with the patient   Disposition:   F/U  12 months   Orders Placed This Encounter  Procedures  .  Hepatic function panel  . Lipid Profile  . EKG 12-Lead     Signed, Esmond Plants, M.D., Ph.D. 02/26/2016  [place], [place]

## 2016-03-01 ENCOUNTER — Other Ambulatory Visit: Payer: Medicare PPO

## 2016-03-01 DIAGNOSIS — I4510 Unspecified right bundle-branch block: Secondary | ICD-10-CM

## 2016-03-01 DIAGNOSIS — I10 Essential (primary) hypertension: Secondary | ICD-10-CM

## 2016-03-01 DIAGNOSIS — E785 Hyperlipidemia, unspecified: Secondary | ICD-10-CM

## 2016-03-02 LAB — HEPATIC FUNCTION PANEL
ALT: 20 IU/L (ref 0–44)
AST: 21 IU/L (ref 0–40)
Albumin: 4.3 g/dL (ref 3.6–4.8)
Alkaline Phosphatase: 61 IU/L (ref 39–117)
Bilirubin Total: 0.7 mg/dL (ref 0.0–1.2)
Bilirubin, Direct: 0.21 mg/dL (ref 0.00–0.40)
TOTAL PROTEIN: 6.2 g/dL (ref 6.0–8.5)

## 2016-03-02 LAB — LIPID PANEL
CHOLESTEROL TOTAL: 142 mg/dL (ref 100–199)
Chol/HDL Ratio: 2.9 ratio units (ref 0.0–5.0)
HDL: 49 mg/dL (ref >39)
LDL CALC: 70 mg/dL (ref 0–99)
TRIGLYCERIDES: 117 mg/dL (ref 0–149)
VLDL CHOLESTEROL CAL: 23 mg/dL (ref 5–40)

## 2016-03-29 ENCOUNTER — Other Ambulatory Visit: Payer: Self-pay | Admitting: Family Medicine

## 2016-03-29 MED ORDER — FINASTERIDE 5 MG PO TABS: 5.0000 mg | ORAL_TABLET | Freq: Every day | ORAL | 4 refills | Status: DC

## 2016-03-29 NOTE — Telephone Encounter (Signed)
Refilled 10/07/15. Pt last seen 11/27/15. please advise?

## 2016-05-04 ENCOUNTER — Ambulatory Visit: Payer: Medicare PPO

## 2016-05-13 ENCOUNTER — Ambulatory Visit (INDEPENDENT_AMBULATORY_CARE_PROVIDER_SITE_OTHER): Payer: Medicare HMO | Admitting: Family Medicine

## 2016-05-13 ENCOUNTER — Encounter: Payer: Self-pay | Admitting: Family Medicine

## 2016-05-13 VITALS — BP 130/86 | HR 80 | Temp 98.3°F | Ht 70.0 in | Wt 209.0 lb

## 2016-05-13 DIAGNOSIS — E785 Hyperlipidemia, unspecified: Secondary | ICD-10-CM | POA: Diagnosis not present

## 2016-05-13 DIAGNOSIS — R221 Localized swelling, mass and lump, neck: Secondary | ICD-10-CM | POA: Diagnosis not present

## 2016-05-13 DIAGNOSIS — I10 Essential (primary) hypertension: Secondary | ICD-10-CM

## 2016-05-13 DIAGNOSIS — Z0001 Encounter for general adult medical examination with abnormal findings: Secondary | ICD-10-CM

## 2016-05-13 DIAGNOSIS — D582 Other hemoglobinopathies: Secondary | ICD-10-CM

## 2016-05-13 DIAGNOSIS — R739 Hyperglycemia, unspecified: Secondary | ICD-10-CM

## 2016-05-13 DIAGNOSIS — Z125 Encounter for screening for malignant neoplasm of prostate: Secondary | ICD-10-CM

## 2016-05-13 DIAGNOSIS — N400 Enlarged prostate without lower urinary tract symptoms: Secondary | ICD-10-CM | POA: Insufficient documentation

## 2016-05-13 DIAGNOSIS — Z0000 Encounter for general adult medical examination without abnormal findings: Secondary | ICD-10-CM | POA: Insufficient documentation

## 2016-05-13 LAB — COMPREHENSIVE METABOLIC PANEL
ALK PHOS: 61 U/L (ref 39–117)
ALT: 17 U/L (ref 0–53)
AST: 17 U/L (ref 0–37)
Albumin: 4.3 g/dL (ref 3.5–5.2)
BILIRUBIN TOTAL: 1 mg/dL (ref 0.2–1.2)
BUN: 20 mg/dL (ref 6–23)
CO2: 30 meq/L (ref 19–32)
Calcium: 9.6 mg/dL (ref 8.4–10.5)
Chloride: 106 mEq/L (ref 96–112)
Creatinine, Ser: 0.99 mg/dL (ref 0.40–1.50)
GFR: 79.49 mL/min (ref >60.00)
GLUCOSE: 105 mg/dL — AB (ref 70–99)
Potassium: 4.3 mEq/L (ref 3.5–5.1)
SODIUM: 141 meq/L (ref 135–145)
TOTAL PROTEIN: 6.7 g/dL (ref 6.0–8.3)

## 2016-05-13 LAB — LIPID PANEL
CHOL/HDL RATIO: 3
Cholesterol: 152 mg/dL (ref 0–200)
HDL: 54.7 mg/dL (ref >39.00)
LDL Cholesterol: 80 mg/dL (ref 0–99)
NONHDL: 97.04
Triglycerides: 83 mg/dL (ref 0.0–149.0)
VLDL: 16.6 mg/dL (ref 0.0–40.0)

## 2016-05-13 LAB — CBC
HCT: 48.7 % (ref 39.0–52.0)
HEMOGLOBIN: 16.4 g/dL (ref 13.0–17.0)
MCHC: 33.7 g/dL (ref 30.0–36.0)
MCV: 85.6 fl (ref 78.0–100.0)
Platelets: 221 10*3/uL (ref 150.0–400.0)
RBC: 5.7 Mil/uL (ref 4.22–5.81)
RDW: 14 % (ref 11.5–15.5)
WBC: 6.9 10*3/uL (ref 4.0–10.5)

## 2016-05-13 LAB — TSH: TSH: 0.21 u[IU]/mL — AB (ref 0.35–4.50)

## 2016-05-13 LAB — HEMOGLOBIN A1C: Hgb A1c MFr Bld: 5.7 % (ref 4.6–6.5)

## 2016-05-13 LAB — PSA: PSA: 0.33 ng/mL (ref 0.10–4.00)

## 2016-05-13 MED ORDER — ZOSTER VAC RECOMB ADJUVANTED 50 MCG/0.5ML IM SUSR: 1.0000 | Freq: Once | INTRAMUSCULAR | 0 refills | Status: AC

## 2016-05-13 NOTE — Progress Notes (Signed)
Pre visit review using our clinic review tool, if applicable. No additional management support is needed unless otherwise documented below in the visit note. 

## 2016-05-13 NOTE — Assessment & Plan Note (Signed)
Preventative healthcare up-to-date. He is a candidate for the new shingles vaccine. He is amenable. Sending Rx. Labs today.

## 2016-05-13 NOTE — Assessment & Plan Note (Signed)
New problem. Uncertain etiology/prognosis this time. Possible thyroid nodule versus goiter. Arranging thyroid ultrasound. TSH today.

## 2016-05-13 NOTE — Progress Notes (Signed)
Subjective:  Patient ID: Alexander Fitzgerald, male    DOB: [birth year]  Age: 71 y.o. MRN: [id number]  CC: Annual physical exam  HPI Alexander Fitzgerald is a 71 y.o. male presents to the clinic today for an annual physical exam.  Preventative Healthcare  Colonoscopy: Up-to-date.  Immunizations  Tetanus - up-to-date.  Pneumococcal - up-to-date.  Flu - up-to-date.  Zoster - has had a Zostavax. Will discuss Shingrix.  Prostate cancer screening: Up to date.  Labs: Labs today.  Exercise: Very active.  Alcohol use: See below.  Smoking/tobacco use: Former smoker.  PMH, Surgical Hx, Family Hx, Social History reviewed and updated as below.  Past Medical History:  Diagnosis Date  . Asthma    adult onset, PFTs done in [place]  . Cancer ([place])    Several moles removed in past, all pre-cancerous.  Recently followed by Dr. Nehemiah Massed  . Depression    Treated with Prozac in 1992,  Associate with job loss  . Esophageal reflux   . Gout   . Hiatal hernia   . Hypertension    Past Surgical History:  Procedure Laterality Date  . APPENDECTOMY    . COLONOSCOPY N/A 12/27/2014   Procedure: COLONOSCOPY;  Surgeon: Lollie Sails, MD;  [facility] END[facility]COPY;  Service: Endoscopy;  Laterality: N/A;  . LAPAROSCOPIC PARAESOPHAGEAL HERNIA REPAIR    . TONSILLECTOMY     Family History  Problem Relation Age of Onset  . Hypertension Mother   . Hyperlipidemia Mother   . Heart attack Father 34    MI  . Cancer Sister    Social History  Substance Use Topics  . Smoking status: Former Smoker    Packs/day: 0.50    Years: 20.00    Types: Cigarettes    Quit date: 06/15/1999  . Smokeless tobacco: Never Used  . Alcohol use 0.6 oz/week    1 Standard drinks or equivalent per week     Comment: rarely    Review of Systems  General: Denies unexplained weight loss, fever. Skin: Denies new or changing mole, sore/wound that won't heal. ENT: Trouble hearing, ringing in the ears, sores in the mouth,  hoarseness, trouble swallowing. Eyes: Denies trouble seeing/visual disturbance. Heart/CV: Denies chest pain, shortness of breath, edema, palpitations. Lungs/Resp: Denies cough, shortness of breath, hemoptysis. Abd/GI: Denies nausea, vomiting, diarrhea, constipation, abdominal pain, hematochezia, melena. GU: Denies dysuria, incontinence, hematuria, urinary frequency, difficulty starting/keeping stream, penile discharge, sexual difficulty, lump in testicles. MSK: Denies joint pain/swelling, myalgias. Neuro: Denies headaches, weakness, numbness, dizziness, syncope. Psych: Denies sadness, anxiety, stress, memory difficulty. Endocrine: Denies polyuria and polydipsia.  Objective:   Today's Vitals: BP 130/86   Pulse 80   Temp 98.3 F (36.8 C) (Oral)   Ht 5\' 10"  (1.778 m)   Wt 209 lb (94.8 kg)   SpO2 95%   BMI 29.99 kg/m   Physical Exam  Constitutional: He is oriented to person, place, and time. He appears well-developed. No distress.  HENT:  Head: Normocephalic and atraumatic.  Mouth/Throat: Oropharynx is clear and moist.  Eyes: Conjunctivae are normal.  Neck:  Neck - Fullness noted on the right; ? Thyroid nodule vs goiter.  Cardiovascular: Normal rate and regular rhythm.   Pulmonary/Chest: Effort normal and breath sounds normal.  Abdominal: Soft. He exhibits no distension. There is no tenderness. There is no rebound and no guarding.  Neurological: He is alert and oriented to person, place, and time.  Skin: Skin is warm. No rash noted.  Psychiatric: He has  a normal mood and affect.  Vitals reviewed.  Assessment & Plan:   Problem List Items Addressed This Visit    Neck fullness    New problem. Uncertain etiology/prognosis this time. Possible thyroid nodule versus goiter. Arranging thyroid ultrasound. TSH today.      Relevant Orders   TSH   US THYROID   Hypertension   Relevant Orders   Comprehensive metabolic panel   Encounter for health maintenance examination with  abnormal findings - Primary    Preventative healthcare up-to-date. He is a candidate for the new shingles vaccine. He is amenable. Sending Rx. Labs today.       Other Visit Diagnoses    Elevated hemoglobin ([place])       Relevant Orders   CBC   Hyperlipidemia, unspecified hyperlipidemia type       Relevant Orders   Lipid panel   Screening for prostate cancer       Relevant Orders   PSA   Blood glucose elevated       Relevant Orders   Hemoglobin A1c      Outpatient Encounter Prescriptions as of 05/13/2016  Medication Sig  . Ascorbic Acid (VITAMIN C) 1000 MG tablet Take 1,000 mg by mouth daily. Reported on 06/26/2015  . aspirin 81 MG chewable tablet Chew by mouth daily.  . Cyanocobalamin (VITAMIN B-12) 5000 MCG SUBL Place 2 each under the tongue daily.    Marland Kitchen docusate sodium (COLACE) 100 MG capsule Take 100 mg by mouth daily.  . finasteride (PROSCAR) 5 MG tablet Take 1 tablet (5 mg total) by mouth daily.  . folic acid (FOLVITE) Q000111Q MCG tablet Take 800 mcg by mouth daily.   Marland Kitchen losartan (COZAAR) 25 MG tablet Take 1 tablet (25 mg total) by mouth daily.  . montelukast (SINGULAIR) 10 MG tablet Take 1 tablet (10 mg total) by mouth at bedtime.  . Multiple Vitamin (MULTIVITAMIN) capsule Take 1 capsule by mouth daily.    . pantoprazole (PROTONIX) 40 MG tablet TAKE 1 TABLET BY MOUTH EVERY DAY -- 1 HOUR BEFORE A MEAL AS DIRECTED  . PROAIR HFA 108 (90 Base) MCG/ACT inhaler TAKE 2 PUFFS 4 TIMES A DAY AS NEEDED  . Pyridoxine HCl (VITAMIN B-6 CR) 200 MG TBCR Take by mouth.    . [DISCONTINUED] doxycycline (VIBRAMYCIN) 50 MG capsule Take 50 mg by mouth daily.   Marland Kitchen Zoster Vac Recomb Adjuvanted (SHINGRIX) 50 MCG SUSR Inject 1 Dose into the muscle once.   No facility-administered encounter medications on file as of 05/13/2016.     Follow-up: 6 months   [place] DO [facility]

## 2016-05-13 NOTE — Patient Instructions (Signed)
Continue your meds.  We will call with the [place] appt.  Follow up in 6 months  Take care  Dr. Lacinda Axon

## 2016-05-19 ENCOUNTER — Ambulatory Visit
Admission: RE | Admit: 2016-05-19 | Discharge: 2016-05-19 | Disposition: A | Discharge status: Home / Self Care | Payer: Medicare HMO | Source: Ambulatory Visit | Attending: Family Medicine | Admitting: Family Medicine

## 2016-05-19 DIAGNOSIS — R221 Localized swelling, mass and lump, neck: Secondary | ICD-10-CM | POA: Insufficient documentation

## 2016-05-19 DIAGNOSIS — E010 Iodine-deficiency related diffuse (endemic) goiter: Secondary | ICD-10-CM | POA: Insufficient documentation

## 2016-05-19 DIAGNOSIS — E0789 Other specified disorders of thyroid: Secondary | ICD-10-CM | POA: Diagnosis not present

## 2016-05-20 ENCOUNTER — Telehealth: Payer: Self-pay | Admitting: Family Medicine

## 2016-05-20 ENCOUNTER — Other Ambulatory Visit: Payer: Self-pay | Admitting: Family Medicine

## 2016-05-20 DIAGNOSIS — E042 Nontoxic multinodular goiter: Secondary | ICD-10-CM

## 2016-05-20 MED ORDER — LOSARTAN POTASSIUM 25 MG PO TABS: 25.0000 mg | ORAL_TABLET | Freq: Every day | ORAL | 3 refills | Status: DC

## 2016-05-20 NOTE — Telephone Encounter (Signed)
PCP called pt and gave him his [place] results.

## 2016-05-20 NOTE — Telephone Encounter (Signed)
Medication refilled

## 2016-05-20 NOTE — Telephone Encounter (Signed)
losartan (COZAAR) 25 MG tablet take one tablet by mouth daily. Qty 90 refills 3

## 2016-05-20 NOTE — Telephone Encounter (Signed)
Patient given results. Needs Lab appt for thyroid studies.

## 2016-05-20 NOTE — Telephone Encounter (Signed)
Pt led in regards to thyroid u/s results. Advised pt that we have received them just that Dr. Lacinda Axon has not reviewed them. Asked to be called once reviewed.  Call pt @ [phone]

## 2016-05-21 NOTE — Telephone Encounter (Signed)
Pt called and scheduled for 05/24/16 @ 1015 a.m.

## 2016-05-24 ENCOUNTER — Other Ambulatory Visit (INDEPENDENT_AMBULATORY_CARE_PROVIDER_SITE_OTHER): Payer: Medicare HMO

## 2016-05-24 ENCOUNTER — Other Ambulatory Visit: Payer: Self-pay | Admitting: Family Medicine

## 2016-05-24 ENCOUNTER — Other Ambulatory Visit: Payer: Medicare HMO

## 2016-05-24 DIAGNOSIS — R7989 Other specified abnormal findings of blood chemistry: Secondary | ICD-10-CM

## 2016-05-24 DIAGNOSIS — R946 Abnormal results of thyroid function studies: Secondary | ICD-10-CM

## 2016-05-24 DIAGNOSIS — H2513 Age-related nuclear cataract, bilateral: Secondary | ICD-10-CM | POA: Diagnosis not present

## 2016-05-24 DIAGNOSIS — G4733 Obstructive sleep apnea (adult) (pediatric): Secondary | ICD-10-CM | POA: Diagnosis not present

## 2016-05-24 LAB — TSH: TSH: 0.34 u[IU]/mL — ABNORMAL LOW (ref 0.35–4.50)

## 2016-05-24 LAB — T3, FREE: T3, Free: 3.2 pg/mL (ref 2.3–4.2)

## 2016-05-24 LAB — T4, FREE: Free T4: 0.77 ng/dL (ref 0.60–1.60)

## 2016-05-26 ENCOUNTER — Telehealth: Payer: Self-pay | Admitting: *Deleted

## 2016-05-26 NOTE — Telephone Encounter (Signed)
Pt called and was told he could pick up.

## 2016-05-26 NOTE — Telephone Encounter (Signed)
Pt has requested his last three TSH4/3 results along with recent lb panel  Please call pt when ready for pick up Pt contact [phone]

## 2016-05-28 DIAGNOSIS — Z79899 Other long term (current) drug therapy: Secondary | ICD-10-CM | POA: Diagnosis not present

## 2016-05-28 DIAGNOSIS — Z87891 Personal history of nicotine dependence: Secondary | ICD-10-CM | POA: Diagnosis not present

## 2016-05-28 DIAGNOSIS — E042 Nontoxic multinodular goiter: Secondary | ICD-10-CM | POA: Diagnosis not present

## 2016-05-28 DIAGNOSIS — Z7982 Long term (current) use of aspirin: Secondary | ICD-10-CM | POA: Diagnosis not present

## 2016-05-28 DIAGNOSIS — E0590 Thyrotoxicosis, unspecified without thyrotoxic crisis or storm: Secondary | ICD-10-CM | POA: Diagnosis not present

## 2016-06-10 ENCOUNTER — Ambulatory Visit (INDEPENDENT_AMBULATORY_CARE_PROVIDER_SITE_OTHER): Payer: Medicare HMO | Admitting: Family Medicine

## 2016-06-10 ENCOUNTER — Encounter: Payer: Self-pay | Admitting: Family Medicine

## 2016-06-10 DIAGNOSIS — J209 Acute bronchitis, unspecified: Secondary | ICD-10-CM | POA: Diagnosis not present

## 2016-06-10 DIAGNOSIS — J45909 Unspecified asthma, uncomplicated: Secondary | ICD-10-CM | POA: Insufficient documentation

## 2016-06-10 MED ORDER — DOXYCYCLINE HYCLATE 100 MG PO TABS: 100.0000 mg | ORAL_TABLET | Freq: Two times a day (BID) | ORAL | 0 refills | Status: DC

## 2016-06-10 MED ORDER — PREDNISONE 50 MG PO TABS: ORAL_TABLET | ORAL | 0 refills | Status: DC

## 2016-06-10 NOTE — Progress Notes (Signed)
Pre visit review using our clinic review tool, if applicable. No additional management support is needed unless otherwise documented below in the visit note. 

## 2016-06-10 NOTE — Progress Notes (Signed)
Subjective:  Patient ID: Alexander Fitzgerald, male    DOB: May 09, 1945  Age: 71 y.o. MRN: [id number]  CC: Cough  HPI:  71 year old male with hx of asthma presents with the above complaint.  Cough   X 3.5 weeks.  Dry cough predominantly with intermittent sputum production (yellow).  Nagging cough. Mild to moderate in severity.  No associated fever, chills.  No SOB.  He has used Advair and albuterol recently With some improvement.  He otherwise feels well.  No other associated symptoms or complaints at this time.  Social Hx   Social History   Social History  . Marital status: Married    Spouse name: N/A  . Number of children: N/A  . Years of education: N/A   Social History Main Topics  . Smoking status: Former Smoker    Packs/day: 0.50    Years: 20.00    Types: Cigarettes    Quit date: 06/15/1999  . Smokeless tobacco: Never Used  . Alcohol use 0.6 oz/week    1 Standard drinks or equivalent per week     Comment: rarely  . Drug use: No  . Sexual activity: Yes   Other Topics Concern  . None   Social History Narrative  . None    Review of Systems  Constitutional: Negative.   Respiratory: Positive for cough.    Objective:  BP 128/79   Pulse 76   Temp 98.2 F (36.8 C) (Oral)   Wt 209 lb 3.2 oz (94.9 kg)   SpO2 95%   BMI 30.02 kg/m   BP/Weight 06/10/2016 05/13/2016 [id number]  Systolic BP [id number]  Diastolic BP 79 86 88  Wt. (Lbs) 209.2 209 205.5  BMI 30.02 29.99 30.35   Physical Exam  Constitutional: He is oriented to person, place, and time. He appears well-developed. No distress.  Cardiovascular: Normal rate and regular rhythm.   Pulmonary/Chest: Effort normal. He has wheezes.  Neurological: He is alert and oriented to person, place, and time.  Psychiatric: He has a normal mood and affect.  Vitals reviewed.  Lab Results  Component Value Date   WBC 6.9 05/13/2016   HGB 16.4 05/13/2016   HCT 48.7 05/13/2016   PLT 221.0 05/13/2016   GLUCOSE 105 (H) 05/13/2016   CHOL 152 05/13/2016   TRIG 83.0 05/13/2016   HDL 54.70 05/13/2016   LDLCALC 80 05/13/2016   ALT 17 05/13/2016   AST 17 05/13/2016   NA 141 05/13/2016   K 4.3 05/13/2016   CL 106 05/13/2016   CREATININE 0.99 05/13/2016   BUN 20 05/13/2016   CO2 30 05/13/2016   TSH 0.34 (L) 05/24/2016   PSA 0.33 05/13/2016   INR 0.93 01/13/2011   HGBA1C 5.7 05/13/2016   MICROALBUR 0.8 04/15/2014    Assessment & Plan:   Problem List Items Addressed This Visit    Acute bronchitis    New acute problem. Treating with prednisone. Doxy given to be filled if he fails to improve or worsens.         Meds ordered this encounter  Medications  . predniSONE (DELTASONE) 50 MG tablet    Sig: 1 tablet daily x 5 days.    Dispense:  5 tablet    Refill:  0  . doxycycline (VIBRA-TABS) 100 MG tablet    Sig: Take 1 tablet (100 mg total) by mouth 2 (two) times daily.    Dispense:  14 tablet    Refill:  0    Follow-up:  PRN  [place]

## 2016-06-10 NOTE — Assessment & Plan Note (Signed)
New acute problem. Treating with prednisone. Doxy given to be filled if he fails to improve or worsens.

## 2016-06-10 NOTE — Patient Instructions (Signed)
Prednisone as prescribed.  Antibiotic if you do not improve or if you worsen in the next week.  Take care  Dr. Lacinda Axon

## 2016-06-18 DIAGNOSIS — E042 Nontoxic multinodular goiter: Secondary | ICD-10-CM | POA: Diagnosis not present

## 2016-06-18 DIAGNOSIS — E0590 Thyrotoxicosis, unspecified without thyrotoxic crisis or storm: Secondary | ICD-10-CM | POA: Diagnosis not present

## 2016-06-21 ENCOUNTER — Other Ambulatory Visit: Payer: Self-pay | Admitting: Family Medicine

## 2016-06-21 MED ORDER — PANTOPRAZOLE SODIUM 40 MG PO TBEC: DELAYED_RELEASE_TABLET | ORAL | 3 refills | Status: DC

## 2016-07-08 MED ORDER — PANTOPRAZOLE SODIUM 40 MG PO TBEC: DELAYED_RELEASE_TABLET | ORAL | 3 refills | Status: DC

## 2016-07-08 NOTE — Addendum Note (Signed)
Addended by: Carmin Muskrat on: 07/08/2016 03:14 PM   Modules accepted: Orders

## 2016-07-13 ENCOUNTER — Encounter: Payer: Self-pay | Admitting: Family Medicine

## 2016-07-13 ENCOUNTER — Ambulatory Visit (INDEPENDENT_AMBULATORY_CARE_PROVIDER_SITE_OTHER): Payer: Medicare HMO | Admitting: Family Medicine

## 2016-07-13 DIAGNOSIS — J069 Acute upper respiratory infection, unspecified: Secondary | ICD-10-CM

## 2016-07-13 NOTE — Progress Notes (Signed)
Subjective:  Patient ID: Alexander Fitzgerald, male    DOB: 05-Oct-1945  Age: 71 y.o. MRN: [id number]  CC: Congestion  HPI:  71 year old male presents for an acute visit with the above complaint.  Patient reports a two-week history of congestion and ear discomfort. He's also had some chest congestion and wheezing however this seems to be improved. He's been taking Allegra and Afrin with some improvement. His primary concern today is the head congestion/ear congestion. No fever. No chills. He has upcoming surgery at the beginning next month and that is the primary reason for his concern as he wants to be well prior to surgery. No other associated symptoms. No other complex or concerns at this time.  Social Hx   Social History   Social History  . Marital status: Married    Spouse name: N/A  . Number of children: N/A  . Years of education: N/A   Social History Main Topics  . Smoking status: Former Smoker    Packs/day: 0.50    Years: 20.00    Types: Cigarettes    Quit date: 06/15/1999  . Smokeless tobacco: Never Used  . Alcohol use 0.6 oz/week    1 Standard drinks or equivalent per week     Comment: rarely  . Drug use: No  . Sexual activity: Yes   Other Topics Concern  . None   Social History Narrative  . None    Review of Systems  Constitutional: Negative for fever.  HENT: Positive for congestion and ear pain.     Objective:  BP 130/84 (BP Location: Left Arm, Patient Position: Sitting, Cuff Size: Normal)   Pulse 82   Temp 98.6 F (37 C) (Oral)   Resp 16   Wt 207 lb 6 oz (94.1 kg)   SpO2 95%   BMI 29.76 kg/m   BP/Weight 07/13/2016 06/10/2016 08/25/3265  Systolic BP 124 580 998  Diastolic BP 84 79 86  Wt. (Lbs) 207.38 209.2 209  BMI 29.76 30.02 29.99    Physical Exam  Constitutional: He is oriented to person, place, and time. He appears well-developed. No distress.  HENT:  Mild effusion noted - R ear.  Cardiovascular: Normal rate and regular rhythm.     Pulmonary/Chest: Effort normal and breath sounds normal.  Neurological: He is alert and oriented to person, place, and time.  Psychiatric: He has a normal mood and affect.  Vitals reviewed.   Lab Results  Component Value Date   WBC 6.9 05/13/2016   HGB 16.4 05/13/2016   HCT 48.7 05/13/2016   PLT 221.0 05/13/2016   GLUCOSE 105 (H) 05/13/2016   CHOL 152 05/13/2016   TRIG 83.0 05/13/2016   HDL 54.70 05/13/2016   LDLCALC 80 05/13/2016   ALT 17 05/13/2016   AST 17 05/13/2016   NA 141 05/13/2016   K 4.3 05/13/2016   CL 106 05/13/2016   CREATININE 0.99 05/13/2016   BUN 20 05/13/2016   CO2 30 05/13/2016   TSH 0.34 (L) 05/24/2016   PSA 0.33 05/13/2016   INR 0.93 01/13/2011   HGBA1C 5.7 05/13/2016   MICROALBUR 0.8 04/15/2014    Assessment & Plan:   Problem List Items Addressed This Visit    URI (upper respiratory infection)    New acute problem. Advised continued use of Allegra. Supportive care. Doxy if he fails to improve prior to surgery (has Rx from last visit).        Follow-up: PRN  [place]  

## 2016-07-13 NOTE — Assessment & Plan Note (Signed)
New acute problem. Advised continued use of Allegra. Supportive care. Doxy if he fails to improve prior to surgery (has Rx from last visit).

## 2016-07-13 NOTE — Progress Notes (Signed)
Pre visit review using our clinic review tool, if applicable. No additional management support is needed unless otherwise documented below in the visit note. 

## 2016-07-25 ENCOUNTER — Other Ambulatory Visit: Payer: Self-pay | Admitting: Family Medicine

## 2016-07-26 ENCOUNTER — Other Ambulatory Visit: Payer: Self-pay | Admitting: Family Medicine

## 2016-07-26 DIAGNOSIS — E042 Nontoxic multinodular goiter: Secondary | ICD-10-CM | POA: Insufficient documentation

## 2016-07-26 DIAGNOSIS — J4530 Mild persistent asthma, uncomplicated: Secondary | ICD-10-CM | POA: Diagnosis not present

## 2016-07-26 DIAGNOSIS — H04123 Dry eye syndrome of bilateral lacrimal glands: Secondary | ICD-10-CM | POA: Diagnosis not present

## 2016-07-26 DIAGNOSIS — G4733 Obstructive sleep apnea (adult) (pediatric): Secondary | ICD-10-CM | POA: Diagnosis not present

## 2016-07-26 DIAGNOSIS — I10 Essential (primary) hypertension: Secondary | ICD-10-CM | POA: Diagnosis not present

## 2016-07-26 DIAGNOSIS — K219 Gastro-esophageal reflux disease without esophagitis: Secondary | ICD-10-CM | POA: Diagnosis not present

## 2016-07-26 MED ORDER — MONTELUKAST SODIUM 10 MG PO TABS: 10.0000 mg | ORAL_TABLET | Freq: Every day | ORAL | 1 refills | Status: DC

## 2016-07-27 DIAGNOSIS — E042 Nontoxic multinodular goiter: Secondary | ICD-10-CM | POA: Diagnosis not present

## 2016-07-27 DIAGNOSIS — G4733 Obstructive sleep apnea (adult) (pediatric): Secondary | ICD-10-CM | POA: Diagnosis not present

## 2016-07-27 DIAGNOSIS — E049 Nontoxic goiter, unspecified: Secondary | ICD-10-CM | POA: Diagnosis not present

## 2016-07-27 DIAGNOSIS — H9193 Unspecified hearing loss, bilateral: Secondary | ICD-10-CM | POA: Diagnosis not present

## 2016-07-27 DIAGNOSIS — Z9989 Dependence on other enabling machines and devices: Secondary | ICD-10-CM | POA: Diagnosis not present

## 2016-07-27 DIAGNOSIS — Z87891 Personal history of nicotine dependence: Secondary | ICD-10-CM | POA: Diagnosis not present

## 2016-07-27 DIAGNOSIS — K219 Gastro-esophageal reflux disease without esophagitis: Secondary | ICD-10-CM | POA: Diagnosis not present

## 2016-07-27 DIAGNOSIS — I10 Essential (primary) hypertension: Secondary | ICD-10-CM | POA: Diagnosis not present

## 2016-07-27 DIAGNOSIS — E0590 Thyrotoxicosis, unspecified without thyrotoxic crisis or storm: Secondary | ICD-10-CM | POA: Diagnosis not present

## 2016-07-27 DIAGNOSIS — Z85828 Personal history of other malignant neoplasm of skin: Secondary | ICD-10-CM | POA: Diagnosis not present

## 2016-07-27 DIAGNOSIS — Z9889 Other specified postprocedural states: Secondary | ICD-10-CM | POA: Diagnosis not present

## 2016-07-30 ENCOUNTER — Other Ambulatory Visit: Payer: Self-pay | Admitting: Family Medicine

## 2016-08-06 ENCOUNTER — Encounter: Payer: Self-pay | Admitting: Family Medicine

## 2016-08-06 DIAGNOSIS — Z4889 Encounter for other specified surgical aftercare: Secondary | ICD-10-CM | POA: Diagnosis not present

## 2016-08-06 DIAGNOSIS — E890 Postprocedural hypothyroidism: Secondary | ICD-10-CM | POA: Diagnosis not present

## 2016-08-09 ENCOUNTER — Other Ambulatory Visit: Payer: Self-pay | Admitting: Family Medicine

## 2016-08-09 DIAGNOSIS — I10 Essential (primary) hypertension: Secondary | ICD-10-CM

## 2016-08-09 DIAGNOSIS — E890 Postprocedural hypothyroidism: Secondary | ICD-10-CM

## 2016-08-11 DIAGNOSIS — Z85828 Personal history of other malignant neoplasm of skin: Secondary | ICD-10-CM | POA: Diagnosis not present

## 2016-08-11 DIAGNOSIS — Z1283 Encounter for screening for malignant neoplasm of skin: Secondary | ICD-10-CM | POA: Diagnosis not present

## 2016-08-11 DIAGNOSIS — D1800 Hemangioma unspecified site: Secondary | ICD-10-CM | POA: Diagnosis not present

## 2016-08-11 DIAGNOSIS — D692 Other nonthrombocytopenic purpura: Secondary | ICD-10-CM | POA: Diagnosis not present

## 2016-08-11 DIAGNOSIS — L821 Other seborrheic keratosis: Secondary | ICD-10-CM | POA: Diagnosis not present

## 2016-08-11 DIAGNOSIS — L578 Other skin changes due to chronic exposure to nonionizing radiation: Secondary | ICD-10-CM | POA: Diagnosis not present

## 2016-08-11 DIAGNOSIS — L812 Freckles: Secondary | ICD-10-CM | POA: Diagnosis not present

## 2016-08-11 DIAGNOSIS — L719 Rosacea, unspecified: Secondary | ICD-10-CM | POA: Diagnosis not present

## 2016-08-11 DIAGNOSIS — D229 Melanocytic nevi, unspecified: Secondary | ICD-10-CM | POA: Diagnosis not present

## 2016-08-23 ENCOUNTER — Telehealth: Payer: Self-pay | Admitting: Family Medicine

## 2016-08-23 ENCOUNTER — Encounter: Payer: Self-pay | Admitting: Family Medicine

## 2016-08-23 ENCOUNTER — Other Ambulatory Visit (INDEPENDENT_AMBULATORY_CARE_PROVIDER_SITE_OTHER): Payer: Medicare HMO

## 2016-08-23 DIAGNOSIS — E890 Postprocedural hypothyroidism: Secondary | ICD-10-CM | POA: Diagnosis not present

## 2016-08-23 DIAGNOSIS — G4733 Obstructive sleep apnea (adult) (pediatric): Secondary | ICD-10-CM | POA: Diagnosis not present

## 2016-08-23 DIAGNOSIS — I10 Essential (primary) hypertension: Secondary | ICD-10-CM | POA: Diagnosis not present

## 2016-08-23 LAB — BASIC METABOLIC PANEL
BUN: 23 mg/dL (ref 6–23)
CHLORIDE: 105 meq/L (ref 96–112)
CO2: 26 mEq/L (ref 19–32)
Calcium: 9.4 mg/dL (ref 8.4–10.5)
Creatinine, Ser: 0.99 mg/dL (ref 0.40–1.50)
GFR: 79.42 mL/min (ref >60.00)
GLUCOSE: 104 mg/dL — AB (ref 70–99)
POTASSIUM: 4.5 meq/L (ref 3.5–5.1)
SODIUM: 139 meq/L (ref 135–145)

## 2016-08-23 LAB — TSH: TSH: 0.05 u[IU]/mL — ABNORMAL LOW (ref 0.35–4.50)

## 2016-08-23 LAB — T4, FREE: Free T4: 1.5 ng/dL (ref 0.60–1.60)

## 2016-08-23 NOTE — Telephone Encounter (Signed)
Patient came into office for lab work and requested BP check patient allowed to relax and BP attained in left arm 140/98 pulse 68/ 02 sats 95%. Patient stated that he had not taken BP medication today, ( Losartan 50 mg) normally takes in the afternoon due to taking levothyroxine in the mornings.

## 2016-08-24 ENCOUNTER — Other Ambulatory Visit: Payer: Self-pay | Admitting: Family Medicine

## 2016-08-24 MED ORDER — AMLODIPINE BESYLATE 5 MG PO TABS: 5.0000 mg | ORAL_TABLET | Freq: Every day | ORAL | 3 refills | Status: DC

## 2016-08-27 ENCOUNTER — Telehealth: Payer: Self-pay | Admitting: Family Medicine

## 2016-08-27 NOTE — Telephone Encounter (Signed)
Patient came into office requested BP check  After starting amlodipine 5 mg on 08/24/16 BP taken left arm 130/88 pulse 81 . Advised patient better than his last reading and that I would forward to PCP.

## 2016-08-27 NOTE — Telephone Encounter (Signed)
Continue to monitor. Goal 130/80. May need increase if BP stays up.  [place]

## 2016-09-16 ENCOUNTER — Ambulatory Visit (INDEPENDENT_AMBULATORY_CARE_PROVIDER_SITE_OTHER): Payer: Medicare HMO | Admitting: Family Medicine

## 2016-09-16 DIAGNOSIS — L729 Follicular cyst of the skin and subcutaneous tissue, unspecified: Secondary | ICD-10-CM | POA: Diagnosis not present

## 2016-09-16 NOTE — Progress Notes (Signed)
Subjective:  Patient ID: Alexander Fitzgerald, male    DOB: 24-Apr-1945  Age: 71 y.o. MRN: [id number]  CC: Lumps, back of neck  HPI:  71 year old male presents with the above complaint.  Patient reports he was doing some work outside and has some irritation in the back of his neck. He palpated around and felt to small nodules. Nontender. No noted surrounding erythema. One of them has essentially resolved and the other has persisted. He has no other associated symptoms. No known exacerbating or relieving factors. No other complaints or concerns at this time.  Social Hx   Social History   Social History  . Marital status: Married    Spouse name: N/A  . Number of children: N/A  . Years of education: N/A   Social History Main Topics  . Smoking status: Former Smoker    Packs/day: 0.50    Years: 20.00    Types: Cigarettes    Quit date: 06/15/1999  . Smokeless tobacco: Never Used  . Alcohol use 0.6 oz/week    1 Standard drinks or equivalent per week     Comment: rarely  . Drug use: No  . Sexual activity: Yes   Other Topics Concern  . Not on file   Social History Narrative  . No narrative on file    Review of Systems  Constitutional: Negative.   Skin:       "Nodules".   Objective:  BP 128/78 (BP Location: Left Arm, Patient Position: Sitting, Cuff Size: Large)   Pulse 84   Temp 98.6 F (37 C) (Oral)   Resp 12   Wt 212 lb (96.2 kg)   SpO2 96%   BMI 30.42 kg/m   BP/Weight 09/16/2016 07/13/2016 7/82/9562  Systolic BP 130 865 784  Diastolic BP 78 84 79  Wt. (Lbs) 212 207.38 209.2  BMI 30.42 29.76 30.02   Physical Exam  Constitutional: He is oriented to person, place, and time. He appears well-developed. No distress.  Cardiovascular: Normal rate and regular rhythm.   Pulmonary/Chest: Effort normal and breath sounds normal. He has no wheezes. He has no rales.  Neurological: He is alert and oriented to person, place, and time.  Skin:  Occipital region on the right with  a palpable, movable nodule. No surrounding erythema. Left occipital region with a small pea-sized nodule, likely a lymph node.  Psychiatric: He has a normal mood and affect.  Vitals reviewed.   Lab Results  Component Value Date   WBC 6.9 05/13/2016   HGB 16.4 05/13/2016   HCT 48.7 05/13/2016   PLT 221.0 05/13/2016   GLUCOSE 104 (H) 08/23/2016   CHOL 152 05/13/2016   TRIG 83.0 05/13/2016   HDL 54.70 05/13/2016   LDLCALC 80 05/13/2016   ALT 17 05/13/2016   AST 17 05/13/2016   NA 139 08/23/2016   K 4.5 08/23/2016   CL 105 08/23/2016   CREATININE 0.99 08/23/2016   BUN 23 08/23/2016   CO2 26 08/23/2016   TSH 0.05 (L) 08/23/2016   PSA 0.33 05/13/2016   INR 0.93 01/13/2011   HGBA1C 5.7 05/13/2016   MICROALBUR 0.8 04/15/2014    Assessment & Plan:   Problem List Items Addressed This Visit      Musculoskeletal and Integument   Benign skin cyst    New problem. [name] area the right occipital region appears to be an underlying cyst versus small lipoma. Advised supportive care. No need for intervention at this time. [name] other area is likely a  lymph node which is barely palpable. We'll continue to monitor. Supportive care.         Follow-up: PRN  [place]

## 2016-09-16 NOTE — Assessment & Plan Note (Signed)
New problem. [name] area the right occipital region appears to be an underlying cyst versus small lipoma. Advised supportive care. No need for intervention at this time. [name] other area is likely a lymph node which is barely palpable. We'll continue to monitor. Supportive care.

## 2016-09-20 DIAGNOSIS — E890 Postprocedural hypothyroidism: Secondary | ICD-10-CM | POA: Diagnosis not present

## 2016-10-26 ENCOUNTER — Telehealth: Payer: Self-pay

## 2016-10-26 ENCOUNTER — Other Ambulatory Visit: Payer: Self-pay | Admitting: Family Medicine

## 2016-10-26 NOTE — Telephone Encounter (Signed)
Pt no showed for their AWV in January.   Will you let me know if you are able to get them to reschedule?

## 2016-11-17 ENCOUNTER — Telehealth: Payer: Self-pay | Admitting: Family Medicine

## 2016-11-17 ENCOUNTER — Encounter: Payer: Self-pay | Admitting: Family Medicine

## 2016-11-17 NOTE — Telephone Encounter (Signed)
Pt called needing orders to have Thyroid checked. Pt had a dose adjustment 7-8 weeks ago. Please advise?  Call pt @ [phone]. Thank you!

## 2016-11-17 NOTE — Telephone Encounter (Signed)
Ok to place order to recheck thyroid levels?

## 2016-11-18 ENCOUNTER — Other Ambulatory Visit (INDEPENDENT_AMBULATORY_CARE_PROVIDER_SITE_OTHER): Payer: Medicare HMO

## 2016-11-18 ENCOUNTER — Other Ambulatory Visit: Payer: Self-pay | Admitting: Family Medicine

## 2016-11-18 DIAGNOSIS — E890 Postprocedural hypothyroidism: Secondary | ICD-10-CM

## 2016-11-18 LAB — T4, FREE: Free T4: 1.23 ng/dL (ref 0.60–1.60)

## 2016-11-18 LAB — TSH: TSH: 2.15 u[IU]/mL (ref 0.35–4.50)

## 2016-11-19 ENCOUNTER — Encounter: Payer: Self-pay | Admitting: Family Medicine

## 2016-11-19 NOTE — Telephone Encounter (Signed)
Patient advised of thyroid results °

## 2016-11-23 DIAGNOSIS — G4733 Obstructive sleep apnea (adult) (pediatric): Secondary | ICD-10-CM | POA: Diagnosis not present

## 2016-12-15 ENCOUNTER — Ambulatory Visit: Payer: Medicare HMO | Admitting: Family Medicine

## 2016-12-16 ENCOUNTER — Ambulatory Visit: Payer: Medicare HMO | Admitting: Family Medicine

## 2016-12-17 DIAGNOSIS — Z0100 Encounter for examination of eyes and vision without abnormal findings: Secondary | ICD-10-CM | POA: Diagnosis not present

## 2016-12-18 ENCOUNTER — Encounter: Payer: Self-pay | Admitting: Family Medicine

## 2016-12-21 ENCOUNTER — Other Ambulatory Visit: Payer: Self-pay | Admitting: Family Medicine

## 2016-12-21 MED ORDER — LEVOTHYROXINE SODIUM 112 MCG PO TABS: 112.0000 ug | ORAL_TABLET | Freq: Every day | ORAL | 0 refills | Status: DC

## 2016-12-21 NOTE — Telephone Encounter (Signed)
Please advise, thanks.

## 2016-12-22 ENCOUNTER — Telehealth: Payer: Self-pay

## 2016-12-22 NOTE — Telephone Encounter (Addendum)
Levothyroxine [employer] manufacturer is on backorder until December ok to change to different manufacturer [employer] .

## 2016-12-22 NOTE — Telephone Encounter (Signed)
Yes

## 2016-12-23 ENCOUNTER — Ambulatory Visit (INDEPENDENT_AMBULATORY_CARE_PROVIDER_SITE_OTHER): Payer: Medicare HMO | Admitting: Family Medicine

## 2016-12-23 ENCOUNTER — Encounter: Payer: Self-pay | Admitting: Family Medicine

## 2016-12-23 DIAGNOSIS — I10 Essential (primary) hypertension: Secondary | ICD-10-CM

## 2016-12-23 DIAGNOSIS — G4733 Obstructive sleep apnea (adult) (pediatric): Secondary | ICD-10-CM

## 2016-12-23 DIAGNOSIS — E7800 Pure hypercholesterolemia, unspecified: Secondary | ICD-10-CM

## 2016-12-23 DIAGNOSIS — Z9989 Dependence on other enabling machines and devices: Secondary | ICD-10-CM

## 2016-12-23 DIAGNOSIS — E890 Postprocedural hypothyroidism: Secondary | ICD-10-CM | POA: Diagnosis not present

## 2016-12-23 MED ORDER — ZOSTER VAC RECOMB ADJUVANTED 50 MCG/0.5ML IM SUSR: 0.5000 mL | Freq: Once | INTRAMUSCULAR | 0 refills | Status: AC

## 2016-12-23 MED ORDER — ZOSTER VAC RECOMB ADJUVANTED 50 MCG/0.5ML IM SUSR: 0.5000 mL | Freq: Once | INTRAMUSCULAR | 0 refills | Status: DC

## 2016-12-23 NOTE — Assessment & Plan Note (Signed)
Stable.       - Continue to monitor

## 2016-12-23 NOTE — Progress Notes (Signed)
Subjective:  Patient ID: Alexander Fitzgerald, male    DOB: 04-16-1946  Age: 71 y.o. MRN: [id number]  CC: Follow up  HPI:  71 year old male with hypertension, OSA, hyperlipidemia, postsurgical hypothyroidism presents for follow-up.  Patient states he is doing well. He states that he is currently battling a cold.  Hypertension and goal on losartan and amlodipine.  Lipids have been stable. No meds at this time.  OSA is stable on CPAP.  Regarding his hypothyroidism, he's been doing well. There is recently been a background of his medication and so he has switched to a different brand. He will need labs in 6 weeks.  Social Hx   Social History   Social History  . Marital status: Married    Spouse name: N/A  . Number of children: N/A  . Years of education: N/A   Social History Main Topics  . Smoking status: Former Smoker    Packs/day: 0.50    Years: 20.00    Types: Cigarettes    Quit date: 06/15/1999  . Smokeless tobacco: Never Used  . Alcohol use 0.6 oz/week    1 Standard drinks or equivalent per week     Comment: rarely  . Drug use: No  . Sexual activity: Yes   Other Topics Concern  . None   Social History Narrative  . None    Review of Systems  Constitutional: Negative.   Respiratory: Positive for cough.    Objective:  BP 110/78 (BP Location: Left Arm, Patient Position: Sitting, Cuff Size: Normal)   Pulse 82   Temp 98.3 F (36.8 C) (Oral)   Resp 16   Wt 220 lb 2 oz (99.8 kg)   SpO2 96%   BMI 31.58 kg/m   BP/Weight 12/23/2016 09/16/2016 5/88/5027  Systolic BP 741 287 867  Diastolic BP 78 78 84  Wt. (Lbs) 220.13 212 207.38  BMI 31.58 30.42 29.76    Physical Exam  Constitutional: He is oriented to person, place, and time. He appears well-developed. No distress.  Cardiovascular: Normal rate and regular rhythm.   No murmur heard. Pulmonary/Chest: Effort normal. He has no wheezes. He has no rales.  Neurological: He is alert and oriented to person, place,  and time.  Psychiatric: He has a normal mood and affect.  Vitals reviewed.   Lab Results  Component Value Date   WBC 6.9 05/13/2016   HGB 16.4 05/13/2016   HCT 48.7 05/13/2016   PLT 221.0 05/13/2016   GLUCOSE 104 (H) 08/23/2016   CHOL 152 05/13/2016   TRIG 83.0 05/13/2016   HDL 54.70 05/13/2016   LDLCALC 80 05/13/2016   ALT 17 05/13/2016   AST 17 05/13/2016   NA 139 08/23/2016   K 4.5 08/23/2016   CL 105 08/23/2016   CREATININE 0.99 08/23/2016   BUN 23 08/23/2016   CO2 26 08/23/2016   TSH 2.15 11/18/2016   PSA 0.33 05/13/2016   INR 0.93 01/13/2011   HGBA1C 5.7 05/13/2016   MICROALBUR 0.8 04/15/2014    Assessment & Plan:   Problem List Items Addressed This Visit    Hypertension    Stable. Continue losartan and amlodipine.      OSA on CPAP    Stable on CPAP.      Pure hypercholesterolemia    Stable. Continue to monitor.       Postsurgical hypothyroidism    TSH in 6 weeks. Stable currently.       Relevant Orders   TSH  Meds ordered this encounter  Medications  . DISCONTD: Zoster Vac Recomb Adjuvanted [facility]) injection    Sig: Inject 0.5 mLs into the muscle once.    Dispense:  0.5 mL    Refill:  0    Please administer second dose at appropriate interval.  . Zoster Vac Recomb Adjuvanted (SHINGRIX) injection    Sig: Inject 0.5 mLs into the muscle once.    Dispense:  0.5 mL    Refill:  0    Please administer second dose at appropriate interval.   Follow-up: 6 months (labs in 6 weeks).   [place]

## 2016-12-23 NOTE — Patient Instructions (Signed)
Follow up in 6 months.  Lab in 6 weeks.  Take care  Dr. Lacinda Axon

## 2016-12-23 NOTE — Assessment & Plan Note (Signed)
TSH in 6 weeks. Stable currently.

## 2016-12-23 NOTE — Assessment & Plan Note (Signed)
Stable.  Continue losartan and amlodipine. ?

## 2016-12-23 NOTE — Telephone Encounter (Signed)
Spoke with pharmacist and advised ok to change to [employer] at [facility] [address].

## 2016-12-23 NOTE — Assessment & Plan Note (Signed)
Stable on CPAP 

## 2017-01-20 ENCOUNTER — Other Ambulatory Visit: Payer: Self-pay | Admitting: Family Medicine

## 2017-02-07 NOTE — Telephone Encounter (Signed)
Rescheduled 02/11/17

## 2017-02-08 DIAGNOSIS — R69 Illness, unspecified: Secondary | ICD-10-CM | POA: Diagnosis not present

## 2017-02-11 ENCOUNTER — Ambulatory Visit: Payer: Medicare HMO

## 2017-02-14 ENCOUNTER — Encounter: Payer: Self-pay | Admitting: Family Medicine

## 2017-02-14 ENCOUNTER — Other Ambulatory Visit: Payer: Self-pay | Admitting: Family Medicine

## 2017-02-14 DIAGNOSIS — E039 Hypothyroidism, unspecified: Secondary | ICD-10-CM

## 2017-02-16 ENCOUNTER — Other Ambulatory Visit (INDEPENDENT_AMBULATORY_CARE_PROVIDER_SITE_OTHER): Payer: Medicare HMO

## 2017-02-16 DIAGNOSIS — E039 Hypothyroidism, unspecified: Secondary | ICD-10-CM

## 2017-02-16 LAB — TSH: TSH: 2.46 u[IU]/mL (ref 0.35–4.50)

## 2017-02-23 DIAGNOSIS — G4733 Obstructive sleep apnea (adult) (pediatric): Secondary | ICD-10-CM | POA: Diagnosis not present

## 2017-03-09 ENCOUNTER — Ambulatory Visit: Payer: Medicare HMO

## 2017-03-14 ENCOUNTER — Ambulatory Visit: Payer: Medicare HMO

## 2017-03-29 ENCOUNTER — Other Ambulatory Visit: Payer: Self-pay | Admitting: Family Medicine

## 2017-05-12 ENCOUNTER — Other Ambulatory Visit: Payer: Self-pay | Admitting: Family Medicine

## 2017-05-12 NOTE — Telephone Encounter (Signed)
Last OV 12/23/16 last filled 07/08/16 by Dr.Cook [id number]

## 2017-05-18 ENCOUNTER — Encounter: Payer: Self-pay | Admitting: Family Medicine

## 2017-05-18 ENCOUNTER — Other Ambulatory Visit: Payer: Self-pay | Admitting: Family Medicine

## 2017-05-18 ENCOUNTER — Ambulatory Visit (INDEPENDENT_AMBULATORY_CARE_PROVIDER_SITE_OTHER): Payer: Medicare HMO | Admitting: Family Medicine

## 2017-05-18 ENCOUNTER — Other Ambulatory Visit: Payer: Self-pay

## 2017-05-18 VITALS — BP 140/90 | HR 95 | Temp 97.4°F | Wt 227.0 lb

## 2017-05-18 DIAGNOSIS — K219 Gastro-esophageal reflux disease without esophagitis: Secondary | ICD-10-CM | POA: Diagnosis not present

## 2017-05-18 DIAGNOSIS — E890 Postprocedural hypothyroidism: Secondary | ICD-10-CM | POA: Diagnosis not present

## 2017-05-18 DIAGNOSIS — J4520 Mild intermittent asthma, uncomplicated: Secondary | ICD-10-CM

## 2017-05-18 DIAGNOSIS — Z13220 Encounter for screening for lipoid disorders: Secondary | ICD-10-CM | POA: Diagnosis not present

## 2017-05-18 DIAGNOSIS — Z9989 Dependence on other enabling machines and devices: Secondary | ICD-10-CM | POA: Diagnosis not present

## 2017-05-18 DIAGNOSIS — G4733 Obstructive sleep apnea (adult) (pediatric): Secondary | ICD-10-CM | POA: Diagnosis not present

## 2017-05-18 DIAGNOSIS — I872 Venous insufficiency (chronic) (peripheral): Secondary | ICD-10-CM

## 2017-05-18 DIAGNOSIS — I10 Essential (primary) hypertension: Secondary | ICD-10-CM

## 2017-05-18 DIAGNOSIS — Z125 Encounter for screening for malignant neoplasm of prostate: Secondary | ICD-10-CM

## 2017-05-18 NOTE — Assessment & Plan Note (Signed)
Recent TSH has been normal.  He will continue Synthroid.

## 2017-05-18 NOTE — Assessment & Plan Note (Signed)
Intermittently uses Advair.  We will continue to monitor.

## 2017-05-18 NOTE — Patient Instructions (Addendum)
Nice to see you. We will have you start monitoring your blood pressure.  Please contact us in a week and let us know what it is. We will have you return for fasting lab work.

## 2017-05-18 NOTE — Assessment & Plan Note (Signed)
Has done well since hiatal hernia repair.

## 2017-05-18 NOTE — Assessment & Plan Note (Signed)
Borderline today.  He will start checking at home and let us know what it is in 1 week.

## 2017-05-18 NOTE — Progress Notes (Signed)
  [name], MD Phone: [phone]  UMBERTO Fitzgerald is a 72 y.o. male who presents today for follow-up.  Hypertension: Patient stopped checking his blood pressure as it had been running well.  Taking losartan and amlodipine.  No chest pain, shortness of breath.  Does have chronic lower extremity edema which he wears compression stockings for.  Appears to be venous insufficiency.  Left slightly greater than right.  Reports he had screening cardiac CT and carotids that checked out okay through his cardiology office.  Hypothyroidism: Taking Synthroid.  No skin changes or heat or cold intolerance.  OSA: Uses a CPAP nightly for 6 hours.  No hypersomnia.  He does wake well rested.  He had a hiatal hernia repaired previously and has done well since then.  He does have asthma.  Notes some chronic scarring in his lower lungs bilaterally that is been present for some time now.  He was a former smoker with about a 10-pack-year history.  Social History   Tobacco Use  Smoking Status Former Smoker  . Packs/day: 0.50  . Years: 20.00  . Pack years: 10.00  . Types: Cigarettes  . Last attempt to quit: 06/15/1999  . Years since quitting: 17.9  Smokeless Tobacco Never Used     ROS see history of present illness  Objective  Physical Exam Vitals:   05/18/17 1449  BP: 140/90  Pulse: 95  Temp: (!) 97.4 F (36.3 C)  SpO2: 96%    BP Readings from Last 3 Encounters:  05/18/17 140/90  12/23/16 110/78  09/16/16 128/78   Wt Readings from Last 3 Encounters:  05/18/17 227 lb (103 kg)  12/23/16 220 lb 2 oz (99.8 kg)  09/16/16 212 lb (96.2 kg)    Physical Exam  Constitutional: No distress.  Cardiovascular: Normal rate, regular rhythm and normal heart sounds.  Pulmonary/Chest: Effort normal and breath sounds normal.  Musculoskeletal: He exhibits no edema.  Neurological: He is alert. Gait normal.  Skin: Skin is warm and dry. He is not diaphoretic.     Assessment/Plan: Please  see individual problem list.  Hypertension Borderline today.  He will start checking at home and let us know what it is in 1 week.  OSA on CPAP Stable.  Continue CPAP.  Asthma Intermittently uses Advair.  We will continue to monitor.  GERD (gastroesophageal reflux disease) Has done well since hiatal hernia repair.  Postsurgical hypothyroidism Recent TSH has been normal.  He will continue Synthroid.  Venous insufficiency Reports history of this.  Relatively stable.  Wears compression stockings.  Continue to monitor.   Orders Placed This Encounter  Procedures  . Hemoglobin A1c    Standing Status:   Future    Standing Expiration Date:   05/18/2018  . Comp Met (CMET)    Standing Status:   Future    Standing Expiration Date:   05/18/2018  . Lipid panel    Standing Status:   Future    Standing Expiration Date:   05/18/2018  . PSA    Standing Status:   Future    Standing Expiration Date:   05/18/2018  Patient will return for fasting labs.  No orders of the defined types were placed in this encounter.    [name], MD [place]

## 2017-05-18 NOTE — Assessment & Plan Note (Signed)
Stable.  -Continue CPAP

## 2017-05-18 NOTE — Telephone Encounter (Signed)
Pt has appt today with you at 2:30.

## 2017-05-18 NOTE — Assessment & Plan Note (Signed)
Reports history of this.  Relatively stable.  Wears compression stockings.  Continue to monitor.

## 2017-05-19 ENCOUNTER — Encounter: Payer: Self-pay | Admitting: Family Medicine

## 2017-05-21 ENCOUNTER — Encounter: Payer: Self-pay | Admitting: Family Medicine

## 2017-05-23 ENCOUNTER — Other Ambulatory Visit: Payer: Self-pay | Admitting: Family Medicine

## 2017-05-23 ENCOUNTER — Encounter: Payer: Self-pay | Admitting: Family Medicine

## 2017-05-23 MED ORDER — AMLODIPINE BESYLATE 10 MG PO TABS: 10.0000 mg | ORAL_TABLET | Freq: Every day | ORAL | 3 refills | Status: DC

## 2017-05-26 ENCOUNTER — Other Ambulatory Visit: Payer: Self-pay | Admitting: Family Medicine

## 2017-05-26 DIAGNOSIS — H524 Presbyopia: Secondary | ICD-10-CM | POA: Diagnosis not present

## 2017-05-27 ENCOUNTER — Other Ambulatory Visit: Payer: Medicare HMO

## 2017-05-29 DIAGNOSIS — G4733 Obstructive sleep apnea (adult) (pediatric): Secondary | ICD-10-CM | POA: Diagnosis not present

## 2017-06-02 ENCOUNTER — Other Ambulatory Visit (INDEPENDENT_AMBULATORY_CARE_PROVIDER_SITE_OTHER): Payer: Medicare HMO

## 2017-06-02 DIAGNOSIS — Z13220 Encounter for screening for lipoid disorders: Secondary | ICD-10-CM

## 2017-06-02 DIAGNOSIS — Z125 Encounter for screening for malignant neoplasm of prostate: Secondary | ICD-10-CM

## 2017-06-02 DIAGNOSIS — I10 Essential (primary) hypertension: Secondary | ICD-10-CM

## 2017-06-02 LAB — COMPREHENSIVE METABOLIC PANEL
ALBUMIN: 4.2 g/dL (ref 3.5–5.2)
ALK PHOS: 69 U/L (ref 39–117)
ALT: 19 U/L (ref 0–53)
AST: 18 U/L (ref 0–37)
BUN: 18 mg/dL (ref 6–23)
CO2: 30 mEq/L (ref 19–32)
Calcium: 8.9 mg/dL (ref 8.4–10.5)
Chloride: 104 mEq/L (ref 96–112)
Creatinine, Ser: 1.12 mg/dL (ref 0.40–1.50)
GFR: 68.73 mL/min (ref >60.00)
GLUCOSE: 113 mg/dL — AB (ref 70–99)
Potassium: 4.2 mEq/L (ref 3.5–5.1)
SODIUM: 140 meq/L (ref 135–145)
TOTAL PROTEIN: 6.6 g/dL (ref 6.0–8.3)
Total Bilirubin: 0.7 mg/dL (ref 0.2–1.2)

## 2017-06-02 LAB — LIPID PANEL
CHOLESTEROL: 166 mg/dL (ref 0–200)
HDL: 43.2 mg/dL (ref >39.00)
LDL Cholesterol: 86 mg/dL (ref 0–99)
NONHDL: 122.4
Total CHOL/HDL Ratio: 4
Triglycerides: 181 mg/dL — ABNORMAL HIGH (ref 0.0–149.0)
VLDL: 36.2 mg/dL (ref 0.0–40.0)

## 2017-06-02 LAB — HEMOGLOBIN A1C: HEMOGLOBIN A1C: 6.1 % (ref 4.6–6.5)

## 2017-06-02 LAB — PSA: PSA: 0.21 ng/mL (ref 0.10–4.00)

## 2017-06-07 ENCOUNTER — Encounter: Payer: Self-pay | Admitting: Family Medicine

## 2017-06-09 ENCOUNTER — Other Ambulatory Visit: Payer: Self-pay | Admitting: Family Medicine

## 2017-06-09 DIAGNOSIS — I10 Essential (primary) hypertension: Secondary | ICD-10-CM

## 2017-06-09 NOTE — Telephone Encounter (Signed)
Scheduled, please place orders 

## 2017-06-12 ENCOUNTER — Other Ambulatory Visit: Payer: Self-pay | Admitting: Family Medicine

## 2017-06-16 IMAGING — DX DG KNEE COMPLETE 4+V*L*
4 series · 4 of 4 positions shown · non-contrast
Comparison: None in PACs

CLINICAL DATA: Three weeks of posterior knee pain while walking the
dog down heel ; underwent joint aspiration today, no known injury.

EXAM:
LEFT KNEE - COMPLETE 4+ VIEW

[knee ap]
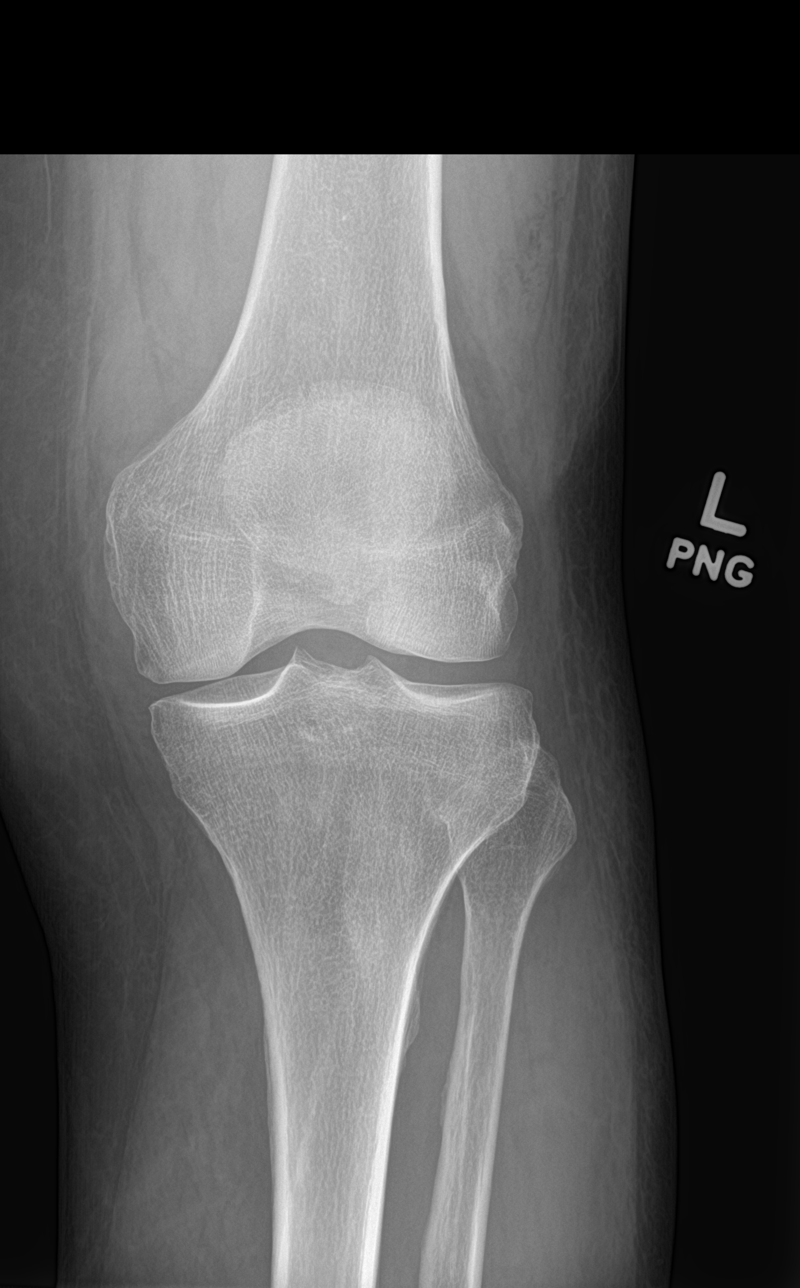

[knee tunnel]
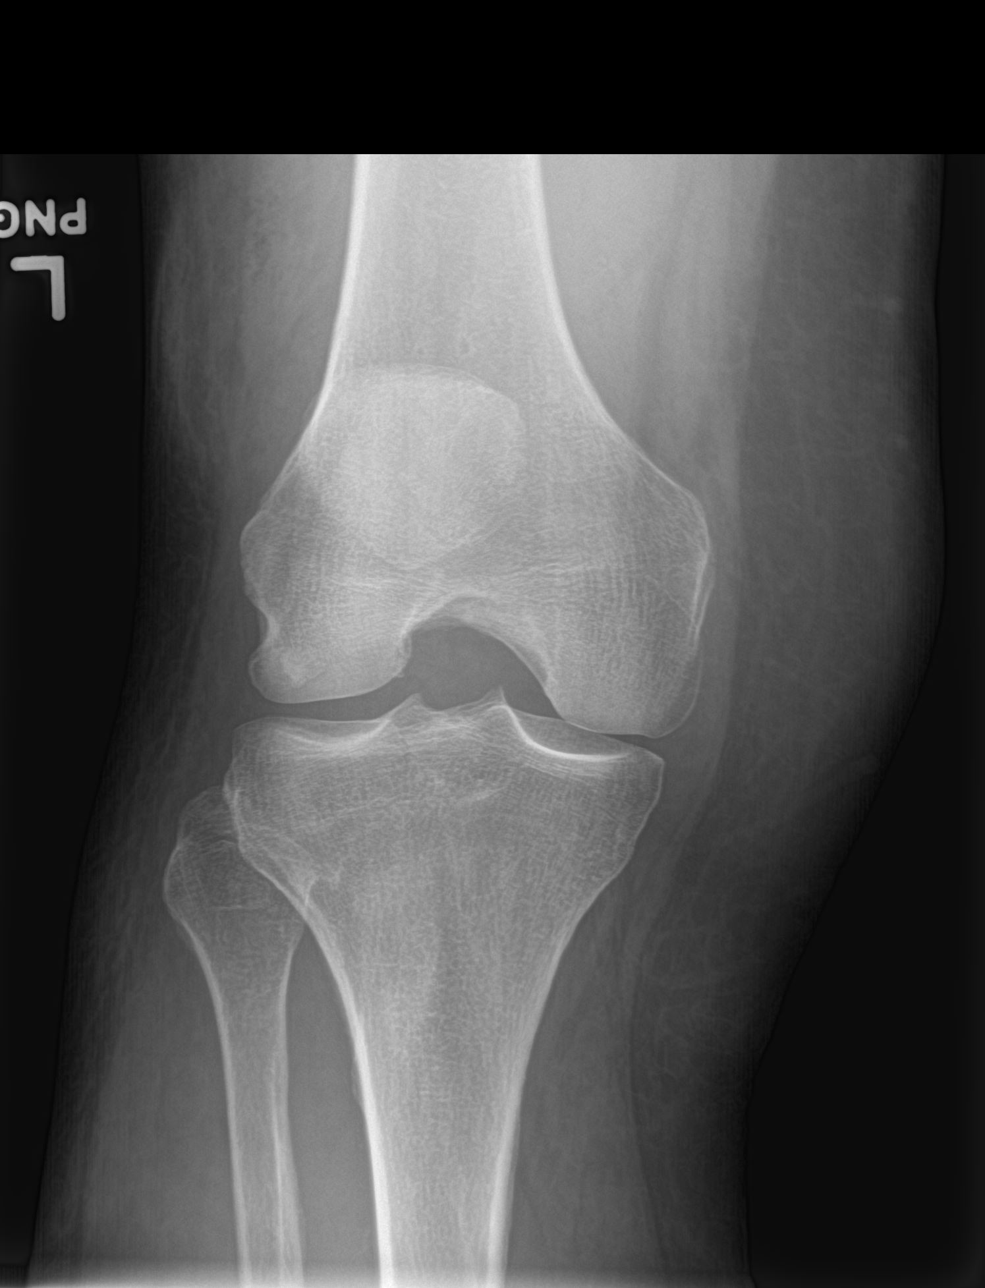

[knee lat]
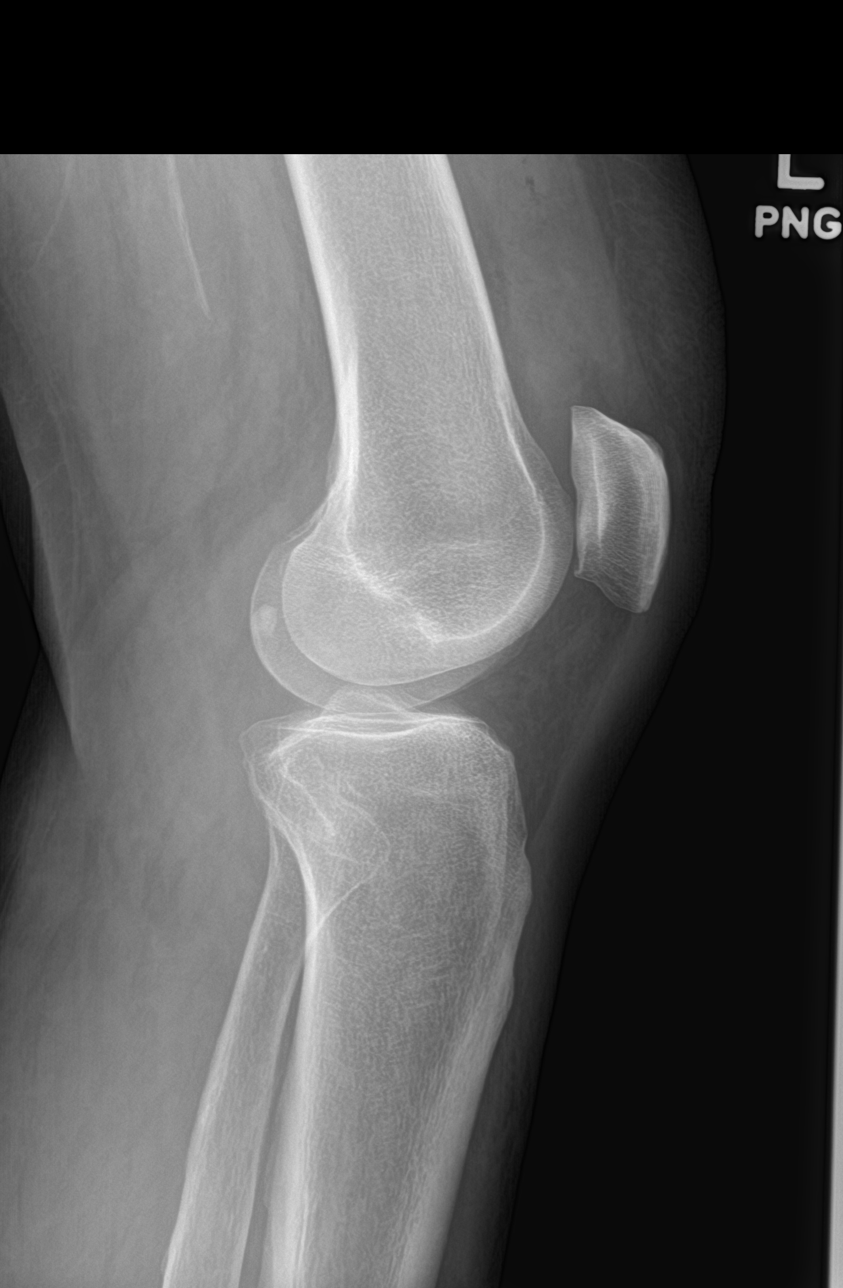

[sunrise]
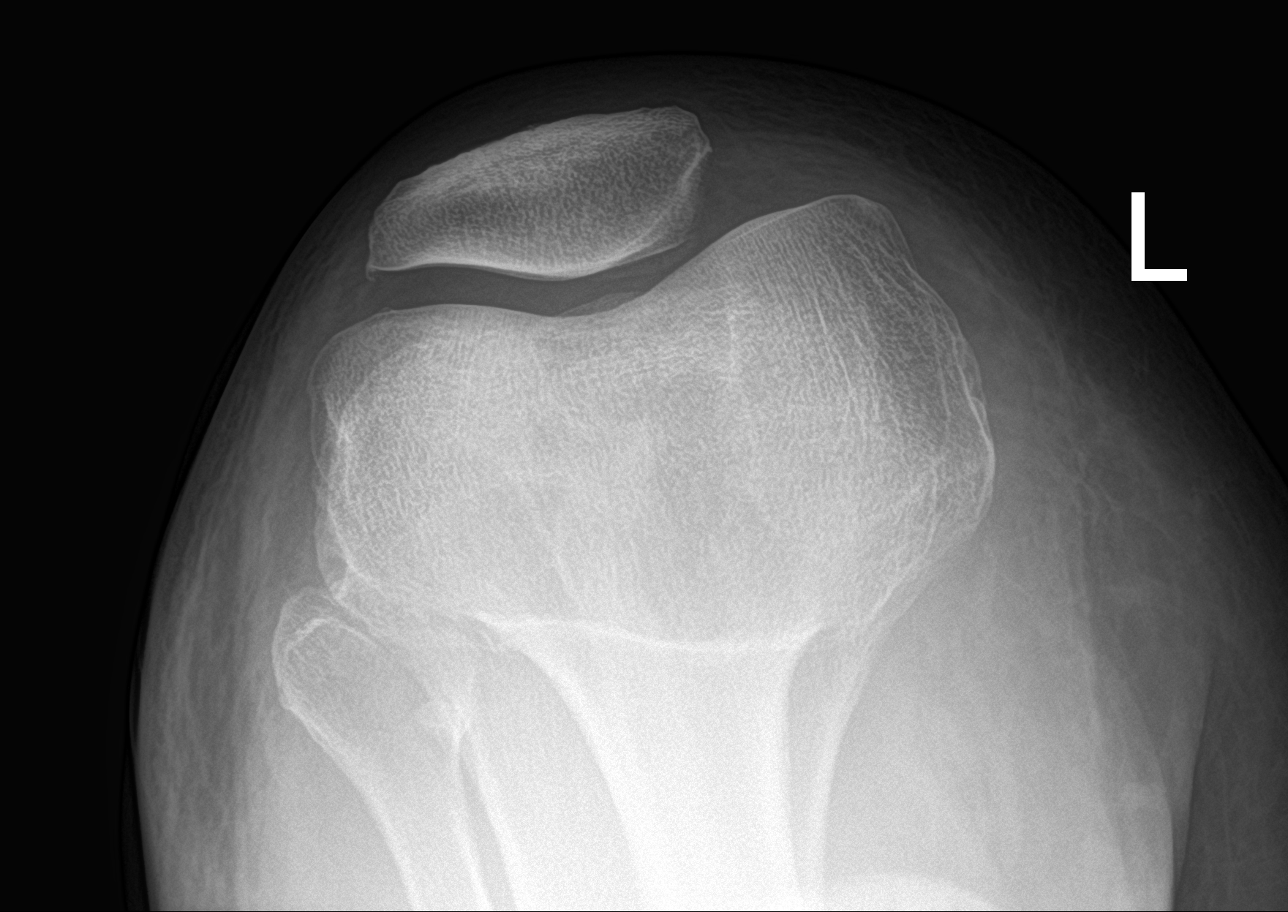

[4 of 4 positions shown; findings below may reference images not displayed]

FINDINGS: The bones appear adequately mineralized. There is mild narrowing of
the medial joint compartment. There is beaking of the tibial spines.
There are small spurs arising from the inferior and lateral
articular margins of the patella. There is small suprapatellar
effusion. There is no acute fracture nor dislocation.
IMPRESSION: Mild osteoarthritic change centered on the medial and patellofemoral
joint spaces. Small suprapatellar effusion.

## 2017-06-17 ENCOUNTER — Other Ambulatory Visit (INDEPENDENT_AMBULATORY_CARE_PROVIDER_SITE_OTHER): Payer: Medicare HMO

## 2017-06-17 DIAGNOSIS — I10 Essential (primary) hypertension: Secondary | ICD-10-CM | POA: Diagnosis not present

## 2017-06-17 LAB — BASIC METABOLIC PANEL
BUN: 16 mg/dL (ref 6–23)
CO2: 30 mEq/L (ref 19–32)
Calcium: 9.3 mg/dL (ref 8.4–10.5)
Chloride: 106 mEq/L (ref 96–112)
Creatinine, Ser: 1.1 mg/dL (ref 0.40–1.50)
GFR: 70.17 mL/min (ref >60.00)
Glucose, Bld: 106 mg/dL — ABNORMAL HIGH (ref 70–99)
Potassium: 4.3 mEq/L (ref 3.5–5.1)
Sodium: 141 mEq/L (ref 135–145)

## 2017-06-19 NOTE — Progress Notes (Addendum)
[name] [address] [place], [place] [address] Phone: [phone] Subjective:      CC: Knee pain  FOY:[id number]  Alexander Fitzgerald is a 72 y.o. male coming in with complaint of right knee pain.  Known history of arthritis of the left knee.  Patient was given an injection nearly 2 years ago.  Patient states more pain.  Starting to give her some instability.  Rates the severity of pain is 7 out of 10.  Possible swelling.  Worse with weightbearing.  Denies any injury      Past Medical History:  Diagnosis Date  . Asthma    adult onset, PFTs done in [place]  . Cancer ([place])    Several moles removed in past, all pre-cancerous.  Recently followed by Dr. Nehemiah Massed  . Depression    Treated with Prozac in 1992,  Associate with job loss  . Esophageal reflux   . Gout   . Hiatal hernia   . Hypertension    Past Surgical History:  Procedure Laterality Date  . APPENDECTOMY    . COLONOSCOPY N/A 12/27/2014   Procedure: COLONOSCOPY;  Surgeon: Lollie Sails, MD;  Location: [facility] ENDOSCOPY;  Service: Endoscopy;  Laterality: N/A;  . LAPAROSCOPIC PARAESOPHAGEAL HERNIA REPAIR    . TONSILLECTOMY     Social History   Socioeconomic History  . Marital status: Married    Spouse name: None  . Number of children: None  . Years of education: None  . Highest education level: None  Social Needs  . Financial resource strain: None  . Food insecurity - worry: None  . Food insecurity - inability: None  . Transportation needs - medical: None  . Transportation needs - non-medical: None  Occupational History  . None  Tobacco Use  . Smoking status: Former Smoker    Packs/day: 0.50    Years: 20.00    Pack years: 10.00    Types: Cigarettes    Last attempt to quit: 06/15/1999    Years since quitting: 18.0  . Smokeless tobacco: Never Used  Substance and Sexual Activity  . Alcohol use: Yes    Alcohol/week: 0.6 oz    Types: 1 Standard drinks or equivalent per week   Comment: rarely  . Drug use: No  . Sexual activity: Yes  Other Topics Concern  . None  Social History Narrative  . None   Allergies  Allergen Reactions  . Sulfa Drugs Cross Reactors     Blood in urine   Family History  Problem Relation Age of Onset  . Hypertension Mother   . Hyperlipidemia Mother   . Heart attack Father 78       MI  . Cancer Sister      Past medical history, social, surgical and family history all reviewed in electronic medical record.  No pertanent information unless stated regarding to the chief complaint.   Review of Systems:Review of systems updated and as accurate as of 06/20/17  No headache, visual changes, nausea, vomiting, diarrhea, constipation, dizziness, abdominal pain, skin rash, fevers, chills, night sweats, weight loss, swollen lymph nodes, body aches, , chest pain, shortness of breath, mood changes.  Positive muscle aches and joint swelling  Objective  Blood pressure 132/84, pulse 87, height 5\' 11"  (1.803 m), weight 223 lb (101.2 kg), SpO2 97 %. Systems examined below as of 06/20/17   General: No apparent distress alert and oriented x3 mood and affect normal, dressed appropriately.  HEENT: Pupils equal, extraocular  movements intact  Respiratory: [name] speak in full sentences and does not appear short of breath  Cardiovascular: No lower extremity edema, non tender, no erythema  Skin: Warm dry intact with no signs of infection or rash on extremities or on axial skeleton.  Abdomen: Soft nontender  Neuro: Cranial nerves II through XII are intact, neurovascularly intact in all extremities with 2+ DTRs and 2+ pulses.  Lymph: No lymphadenopathy of posterior or anterior cervical chain or axillae bilaterally.  Gait normal with good balance and coordination.  MSK:  Non tender with full range of motion and good stability and symmetric strength and tone of shoulders, elbows, wrist, hip and ankles bilaterally.  Knee: Right valgus deformity noted.  Large thigh to calf ratio.  Patient noted Tender to palpation over medial and PF joint line.  ROM full in flexion and extension and lower leg rotation. instability with valgus force.  painful patellar compression. Patellar glide with moderate crepitus. Patellar and quadriceps tendons unremarkable. Hamstring and quadriceps strength is normal. Contralateral knee shows his arthritis as well.  Procedure: Real-time Ultrasound Guided Injection of right knee Device: GE Logiq Q7 Ultrasound guided injection is preferred based studies that show increased duration, increased effect, greater accuracy, decreased procedural pain, increased response rate, and decreased cost with ultrasound guided versus blind injection.  Verbal informed consent obtained.  Time-out conducted.  Noted no overlying erythema, induration, or other signs of local infection.  Skin prepped in a sterile fashion.  Local anesthesia: Topical Ethyl chloride.  With sterile technique and under real time ultrasound guidance: With a 22-gauge 2 inch needle patient was injected with 4 cc of 0.5% Marcaine and aspirated 60 cc of strawlike color fluid then injected 1 cc of Kenalog 40 mg/dL. This was from a superior lateral approach.  Completed without difficulty  Pain immediately resolved suggesting accurate placement of the medication.  Advised to call if fevers/chills, erythema, induration, drainage, or persistent bleeding.  Images permanently stored and available for review in the ultrasound unit.  Impression: Technically successful ultrasound guided injection.    Impression and Recommendations:     This case required medical decision making of moderate complexity.      Note: This dictation was prepared with Dragon dictation along with smaller phrase technology. Any transcriptional errors that result from this process are unintentional.

## 2017-06-20 ENCOUNTER — Ambulatory Visit: Payer: Self-pay

## 2017-06-20 ENCOUNTER — Ambulatory Visit: Payer: Medicare HMO | Admitting: Family Medicine

## 2017-06-20 ENCOUNTER — Encounter: Payer: Self-pay | Admitting: Family Medicine

## 2017-06-20 VITALS — BP 132/84 | HR 87 | Ht 71.0 in | Wt 223.0 lb

## 2017-06-20 DIAGNOSIS — M25561 Pain in right knee: Secondary | ICD-10-CM

## 2017-06-20 DIAGNOSIS — G8929 Other chronic pain: Secondary | ICD-10-CM

## 2017-06-20 DIAGNOSIS — M1711 Unilateral primary osteoarthritis, right knee: Secondary | ICD-10-CM | POA: Diagnosis not present

## 2017-06-20 NOTE — Patient Instructions (Signed)
Good to see you  Ice 20 minutes 2 times daily. Usually after activity and before bed. pennsaid pinkie amount topically 2 times daily as needed.  Over the counter get turmeric 500mg  daily  Tart cherry extract any dose at night I do think it is arthritis  See me again in 4-8 weeks

## 2017-06-20 NOTE — Assessment & Plan Note (Signed)
Patient given an injection.  Tolerated the procedure well.  Discussed icing regimen.  Topical anti-inflammatories given.  Discussed the possibility of Visco supplementation.  Follow-up again in 4-8 weeks

## 2017-07-04 ENCOUNTER — Encounter: Payer: Self-pay | Admitting: Family Medicine

## 2017-07-05 ENCOUNTER — Encounter: Payer: Self-pay | Admitting: Family Medicine

## 2017-07-05 ENCOUNTER — Ambulatory Visit: Payer: Medicare HMO | Admitting: Family Medicine

## 2017-07-05 DIAGNOSIS — M1711 Unilateral primary osteoarthritis, right knee: Secondary | ICD-10-CM | POA: Diagnosis not present

## 2017-07-05 NOTE — Assessment & Plan Note (Signed)
Patient did have improvement initially with the injection and worsening, but now better again.  We discussed different treatment options including Visco supplementation.  We discussed also the possibility of advanced imaging.  Patient wants to continue with conservative therapy.  We discussed icing regimen, home exercise, patient will continue with conservative therapy and see me again in 4 weeks

## 2017-07-05 NOTE — Progress Notes (Signed)
[name] [address] [place], [place] [address] Phone: [phone] Subjective:    I'm seeing this patient by the request  of:    CC: Right knee pain follow-up  IOE:[id number]  Alexander Fitzgerald is a 72 y.o. male coming in with complaint of right knee pain. He has been traveling since last visit. Up until yesterday he has had an increase in his pain. He did have a decrease today though and is able to walk with a normal gait. Most of his pain in on medial aspect of knee. He has some improvement with the injection but the medial joint pain did not decrease.  Patient states over the course last 2 days of making improvement.  Is better than when we had the injection.  Discussed icing regimen and home exercises.      Past Medical History:  Diagnosis Date  . Asthma    adult onset, PFTs done in [place]  . Cancer ([place])    Several moles removed in past, all pre-cancerous.  Recently followed by Dr. Nehemiah Massed  . Depression    Treated with Prozac in 1992,  Associate with job loss  . Esophageal reflux   . Gout   . Hiatal hernia   . Hypertension    Past Surgical History:  Procedure Laterality Date  . APPENDECTOMY    . COLONOSCOPY N/A 12/27/2014   Procedure: COLONOSCOPY;  Surgeon: Lollie Sails, MD;  Location: [facility] ENDOSCOPY;  Service: Endoscopy;  Laterality: N/A;  . LAPAROSCOPIC PARAESOPHAGEAL HERNIA REPAIR    . TONSILLECTOMY     Social History   Socioeconomic History  . Marital status: Married    Spouse name: None  . Number of children: None  . Years of education: None  . Highest education level: None  Social Needs  . Financial resource strain: None  . Food insecurity - worry: None  . Food insecurity - inability: None  . Transportation needs - medical: None  . Transportation needs - non-medical: None  Occupational History  . None  Tobacco Use  . Smoking status: Former Smoker    Packs/day: 0.50    Years: 20.00    Pack years: 10.00    Types:  Cigarettes    Last attempt to quit: 06/15/1999    Years since quitting: 18.0  . Smokeless tobacco: Never Used  Substance and Sexual Activity  . Alcohol use: Yes    Alcohol/week: 0.6 oz    Types: 1 Standard drinks or equivalent per week    Comment: rarely  . Drug use: No  . Sexual activity: Yes  Other Topics Concern  . None  Social History Narrative  . None   Allergies  Allergen Reactions  . Sulfa Drugs Cross Reactors     Blood in urine   Family History  Problem Relation Age of Onset  . Hypertension Mother   . Hyperlipidemia Mother   . Heart attack Father 65       MI  . Cancer Sister      Past medical history, social, surgical and family history all reviewed in electronic medical record.  No pertanent information unless stated regarding to the chief complaint.   Review of Systems:Review of systems updated and as accurate as of 07/05/17  No headache, visual changes, nausea, vomiting, diarrhea, constipation, dizziness, abdominal pain, skin rash, fevers, chills, night sweats, weight loss, swollen lymph nodes, body aches, joint swelling, chest pain, shortness of breath, mood changes.  Positive muscle aches  Objective  Blood pressure 132/82, pulse 86, height 5\' 11"  (1.803 m), weight 221 lb (100.2 kg), SpO2 97 %. Systems examined below as of 07/05/17   General: No apparent distress alert and oriented x3 mood and affect normal, dressed appropriately.  HEENT: Pupils equal, extraocular movements intact  Respiratory: [name] speak in full sentences and does not appear short of breath  Cardiovascular: No lower extremity edema, non tender, no erythema  Skin: Warm dry intact with no signs of infection or rash on extremities or on axial skeleton.  Abdomen: Soft nontender  Neuro: Cranial nerves II through XII are intact, neurovascularly intact in all extremities with 2+ DTRs and 2+ pulses.  Lymph: No lymphadenopathy of posterior or anterior cervical chain or axillae bilaterally.    Gait normal with good balance and coordination.  MSK:  Non tender with full range of motion and good stability and symmetric strength and tone of shoulders, elbows, wrist, hip, and ankles bilaterally.  Knee: Right valgus deformity noted.  Abnormal thigh to calf ratio.  Tender to palpation over medial and PF joint line.  ROM full in flexion and extension and lower leg rotation. instability with valgus force.  painful patellar compression. Patellar glide with moderate crepitus. Patellar and quadriceps tendons unremarkable. Hamstring and quadriceps strength is normal. Contralateral knee shows mild arthritic changes   Impression and Recommendations:     This case required medical decision making of moderate complexity.      Note: This dictation was prepared with Dragon dictation along with smaller phrase technology. Any transcriptional errors that result from this process are unintentional.

## 2017-07-05 NOTE — Patient Instructions (Signed)
Good to see you  Alexander Fitzgerald is your friend.  Stay active.  Duexis 3 times a day for 3 days Can use the pennsaid up to 2 times a day  Lets watch it

## 2017-07-11 ENCOUNTER — Ambulatory Visit (INDEPENDENT_AMBULATORY_CARE_PROVIDER_SITE_OTHER): Payer: Medicare HMO | Admitting: Family Medicine

## 2017-07-11 ENCOUNTER — Encounter: Payer: Self-pay | Admitting: Family Medicine

## 2017-07-11 VITALS — BP 146/90 | HR 78 | Temp 98.5°F | Wt 224.2 lb

## 2017-07-11 DIAGNOSIS — I10 Essential (primary) hypertension: Secondary | ICD-10-CM

## 2017-07-11 DIAGNOSIS — M1711 Unilateral primary osteoarthritis, right knee: Secondary | ICD-10-CM

## 2017-07-11 MED ORDER — LOSARTAN POTASSIUM 50 MG PO TABS: 50.0000 mg | ORAL_TABLET | Freq: Every day | ORAL | 3 refills | Status: DC

## 2017-07-11 NOTE — Patient Instructions (Signed)
Nice to see you. Let us increase your losartan to 75 mg daily.  You can take one 50 mg tablet as well as one 25 mg tablet to equal this dose.  Please continue to monitor your blood pressure.  We will plan on rechecking it when you return in 7-10 days for lab work.

## 2017-07-11 NOTE — Assessment & Plan Note (Signed)
Above goal.  We will increase his losartan to 75 mg after discussing this dose versus 100 mg.  Potentially could be somewhat up related to ibuprofen use though has been more elevated at home as well prior to starting on ibuprofen.  He will continue to work on diet and exercise.  He will return in 7-10 days for BMP.  Check BP at that time as well.

## 2017-07-11 NOTE — Assessment & Plan Note (Signed)
Has been taking ibuprofen as well as having had an injection with Dr. Tamala Julian.  He will follow-up with Dr. Tamala Julian as planned.

## 2017-07-11 NOTE — Progress Notes (Signed)
  [name], MD Phone: [phone]  Alexander Fitzgerald is a 72 y.o. male who presents today for f/u.  HYPERTENSION  Disease Monitoring  Home BP Monitoring 130s-140s/80s Chest pain- no    Dyspnea- no Medications  Compliance-  Taking amlodipine 5 mg, losartan 50 mg.  Edema-chronic and stable, unchanged. Has been working on dietary changes and continuing to walk. He has seen sports medicine for his knee and had a steroid injection with some improvement.  He has been taking ibuprofen 800 mg every 8 hours for the last week or so.  He was previously on lisinopril for blood pressure though stopped this related to possible cough.    Social History   Tobacco Use  Smoking Status Former Smoker  . Packs/day: 0.50  . Years: 20.00  . Pack years: 10.00  . Types: Cigarettes  . Last attempt to quit: 06/15/1999  . Years since quitting: 18.0  Smokeless Tobacco Never Used     ROS see history of present illness  Objective  Physical Exam Vitals:   07/11/17 1527  BP: (!) 146/90  Pulse: 78  Temp: 98.5 F (36.9 C)  SpO2: 96%    BP Readings from Last 3 Encounters:  07/11/17 (!) 146/90  07/05/17 132/82  06/20/17 132/84   Wt Readings from Last 3 Encounters:  07/11/17 224 lb 3.2 oz (101.7 kg)  07/05/17 221 lb (100.2 kg)  06/20/17 223 lb (101.2 kg)    Physical Exam  Constitutional: No distress.  Cardiovascular: Normal rate, regular rhythm and normal heart sounds.  Pulmonary/Chest: Effort normal and breath sounds normal.  Neurological: He is alert. Gait normal.  Skin: Skin is warm and dry. He is not diaphoretic.     Assessment/Plan: Please see individual problem list.  Hypertension Above goal.  We will increase his losartan to 75 mg after discussing this dose versus 100 mg.  Potentially could be somewhat up related to ibuprofen use though has been more elevated at home as well prior to starting on ibuprofen.  He will continue to work on diet and exercise.  He will return in  7-10 days for BMP.  Check BP at that time as well.  Degenerative arthritis of right knee Has been taking ibuprofen as well as having had an injection with Dr. Tamala Julian.  He will follow-up with Dr. Tamala Julian as planned.   Orders Placed This Encounter  Procedures  . Basic Metabolic Panel (BMET)    Standing Status:   Future    Standing Expiration Date:   07/12/2018    Meds ordered this encounter  Medications  . losartan (COZAAR) 50 MG tablet    Sig: Take 1 tablet (50 mg total) by mouth daily.    Dispense:  90 tablet    Refill:  Yoe, MD [place]

## 2017-07-19 ENCOUNTER — Ambulatory Visit: Payer: Medicare HMO

## 2017-07-19 ENCOUNTER — Other Ambulatory Visit: Payer: Medicare HMO

## 2017-07-21 ENCOUNTER — Other Ambulatory Visit (INDEPENDENT_AMBULATORY_CARE_PROVIDER_SITE_OTHER): Payer: Medicare HMO

## 2017-07-21 ENCOUNTER — Ambulatory Visit (INDEPENDENT_AMBULATORY_CARE_PROVIDER_SITE_OTHER): Payer: Medicare HMO | Admitting: *Deleted

## 2017-07-21 VITALS — BP 150/90 | HR 89 | Resp 18

## 2017-07-21 DIAGNOSIS — I10 Essential (primary) hypertension: Secondary | ICD-10-CM

## 2017-07-21 LAB — BASIC METABOLIC PANEL
BUN: 20 mg/dL (ref 6–23)
CHLORIDE: 105 meq/L (ref 96–112)
CO2: 27 mEq/L (ref 19–32)
CREATININE: 1.03 mg/dL (ref 0.40–1.50)
Calcium: 9.3 mg/dL (ref 8.4–10.5)
GFR: 75.68 mL/min (ref >60.00)
Glucose, Bld: 120 mg/dL — ABNORMAL HIGH (ref 70–99)
POTASSIUM: 3.9 meq/L (ref 3.5–5.1)
Sodium: 137 mEq/L (ref 135–145)

## 2017-07-21 NOTE — Progress Notes (Signed)
Patient presented for one week BP taken in left arm 150/94 pulse 89.

## 2017-07-21 NOTE — Addendum Note (Signed)
Addended by: Leeanne Rio on: 07/21/2017 10:07 AM   Modules accepted: Orders

## 2017-07-22 ENCOUNTER — Encounter: Payer: Self-pay | Admitting: Family Medicine

## 2017-07-22 ENCOUNTER — Telehealth: Payer: Self-pay | Admitting: Family Medicine

## 2017-07-22 DIAGNOSIS — I10 Essential (primary) hypertension: Secondary | ICD-10-CM

## 2017-07-22 NOTE — Telephone Encounter (Signed)
Pt dropped off a letter asking for a referral. Letter is up front in Dr. Ellen Henri color folder

## 2017-07-25 ENCOUNTER — Other Ambulatory Visit: Payer: Self-pay | Admitting: Family Medicine

## 2017-07-25 ENCOUNTER — Encounter: Payer: Self-pay | Admitting: Family Medicine

## 2017-07-25 DIAGNOSIS — I10 Essential (primary) hypertension: Secondary | ICD-10-CM

## 2017-07-25 MED ORDER — LISINOPRIL 10 MG PO TABS: 10.0000 mg | ORAL_TABLET | Freq: Every day | ORAL | 3 refills | Status: DC

## 2017-07-25 NOTE — Telephone Encounter (Signed)
Placed on your desk. 

## 2017-07-25 NOTE — Progress Notes (Signed)
Patient notified and scheduled 

## 2017-07-25 NOTE — Telephone Encounter (Signed)
Please call the patient to set him up for a BMET 7-10 days after starting back on lisinopril. Thanks.

## 2017-07-25 NOTE — Telephone Encounter (Signed)
Referral placed.

## 2017-07-26 NOTE — Telephone Encounter (Signed)
scheduled

## 2017-07-26 NOTE — Telephone Encounter (Signed)
Were you able to contact him to get him set up for lab work? Order previously placed.

## 2017-07-30 NOTE — Progress Notes (Signed)
[name] [place], [place] [address] Phone: [phone] Subjective:     CC: Bilateral knee follow-up  MPN:[id number]  Alexander Fitzgerald is a 72 y.o. male coming in with complaint of bilateral knee pain.  Failed all conservative therapy.  Patient is having worsening pain that is stopping him from activities.  Not walking as much secondary to the pain.  Feels like he has intermittent swelling.  Sometimes has some instability.    Past Medical History:  Diagnosis Date  . Asthma    adult onset, PFTs done in [place]  . Cancer ([place])    Several moles removed in past, all pre-cancerous.  Recently followed by Dr. Nehemiah Massed  . Depression    Treated with Prozac in 1992,  Associate with job loss  . Esophageal reflux   . Gout   . Hiatal hernia   . Hypertension    Past Surgical History:  Procedure Laterality Date  . APPENDECTOMY    . COLONOSCOPY N/A 12/27/2014   Procedure: COLONOSCOPY;  Surgeon: Lollie Sails, MD;  Location: [facility] ENDOSCOPY;  Service: Endoscopy;  Laterality: N/A;  . LAPAROSCOPIC PARAESOPHAGEAL HERNIA REPAIR    . TONSILLECTOMY     Social History   Socioeconomic History  . Marital status: Married    Spouse name: Not on file  . Number of children: Not on file  . Years of education: Not on file  . Highest education level: Not on file  Occupational History  . Not on file  Social Needs  . Financial resource strain: Not on file  . Food insecurity:    Worry: Not on file    Inability: Not on file  . Transportation needs:    Medical: Not on file    Non-medical: Not on file  Tobacco Use  . Smoking status: Former Smoker    Packs/day: 0.50    Years: 20.00    Pack years: 10.00    Types: Cigarettes    Last attempt to quit: 06/15/1999    Years since quitting: 18.1  . Smokeless tobacco: Never Used  Substance and Sexual Activity  . Alcohol use: Yes    Alcohol/week: 0.6 oz    Types: 1 Standard drinks or equivalent per week   Comment: rarely  . Drug use: No  . Sexual activity: Yes  Lifestyle  . Physical activity:    Days per week: Not on file    Minutes per session: Not on file  . Stress: Not on file  Relationships  . Social connections:    Talks on phone: Not on file    Gets together: Not on file    Attends religious service: Not on file    Active member of club or organization: Not on file    Attends meetings of clubs or organizations: Not on file    Relationship status: Not on file  Other Topics Concern  . Not on file  Social History Narrative  . Not on file   Allergies  Allergen Reactions  . Sulfa Drugs Cross Reactors     Blood in urine   Family History  Problem Relation Age of Onset  . Hypertension Mother   . Hyperlipidemia Mother   . Heart attack Father 17       MI  . Cancer Sister      Past medical history, social, surgical and family history all reviewed in electronic medical record.  No pertanent information unless stated regarding to the chief complaint.  Review of Systems:Review of systems updated and as accurate as of 08/01/17  No headache, visual changes, nausea, vomiting, diarrhea, constipation, dizziness, abdominal pain, skin rash, fevers, chills, night sweats, weight loss, swollen lymph nodes, body aches, chest pain, shortness of breath, mood changes.  Positive muscle aches and joint swelling  Objective  Blood pressure (!) 150/90, pulse 83, height 5\' 11"  (1.803 m), weight 225 lb (102.1 kg), SpO2 96 %. Systems examined below as of 08/01/17   General: No apparent distress alert and oriented x3 mood and affect normal, dressed appropriately.  HEENT: Pupils equal, extraocular movements intact  Respiratory: [name] speak in full sentences and does not appear short of breath  Cardiovascular: No lower extremity edema, non tender, no erythema  Skin: Warm dry intact with no signs of infection or rash on extremities or on axial skeleton.  Abdomen: Soft nontender  Neuro: Cranial  nerves II through XII are intact, neurovascularly intact in all extremities with 2+ DTRs and 2+ pulses.  Lymph: No lymphadenopathy of posterior or anterior cervical chain or axillae bilaterally.  Gait normal with good balance and coordination.  MSK:  Non tender with full range of motion and good stability and symmetric strength and tone of shoulders, elbows, wrist, hip, knee and ankles bilaterally.   Knee: Right valgus deformity noted. Large thigh to calf ratio.  Tender to palpation over medial and PF joint line.  ROM full in flexion and extension and lower leg rotation. instability with valgus force.  painful patellar compression. Patellar glide with moderate crepitus. Patellar and quadriceps tendons unremarkable. Hamstring and quadriceps strength is normal. Contralateral knee shows moderate arthritic changes but no instability.  After informed written and verbal consent, patient was seated on exam table. Right knee was prepped with alcohol swab and utilizing anterolateral approach, patient's right knee space was injected with15 mg/2.5 mL of Orthovisc(sodium hyaluronate) in a prefilled syringe was injected easily into the knee through a 22-gauge needle..Patient tolerated the procedure well without immediate complications.    Impression and Recommendations:     This case required medical decision making of moderate complexity.      Note: This dictation was prepared with Dragon dictation along with smaller phrase technology. Any transcriptional errors that result from this process are unintentional.

## 2017-08-01 ENCOUNTER — Ambulatory Visit: Payer: Medicare HMO | Admitting: Family Medicine

## 2017-08-01 ENCOUNTER — Encounter: Payer: Self-pay | Admitting: Family Medicine

## 2017-08-01 DIAGNOSIS — M1711 Unilateral primary osteoarthritis, right knee: Secondary | ICD-10-CM | POA: Diagnosis not present

## 2017-08-01 NOTE — Assessment & Plan Note (Signed)
Severe arthritis.  Failed all conservative therapy.  Patient is having worsening symptoms within his increasing instability.  Patient does have an abnormal thigh to calf ratio but wants to hold on a custom brace at this time.  Started Visco supplementation.  Follow-up in 1 week for second in a series of 4 injections.  Worsening symptoms to call.

## 2017-08-01 NOTE — Patient Instructions (Signed)
Good to see you Ice the knee in 6 hours OK to do the tylenol 3 times a day if needed See you again next week

## 2017-08-03 ENCOUNTER — Other Ambulatory Visit (INDEPENDENT_AMBULATORY_CARE_PROVIDER_SITE_OTHER): Payer: Medicare HMO

## 2017-08-03 DIAGNOSIS — I10 Essential (primary) hypertension: Secondary | ICD-10-CM | POA: Diagnosis not present

## 2017-08-03 LAB — BASIC METABOLIC PANEL
BUN: 15 mg/dL (ref 6–23)
CALCIUM: 9.3 mg/dL (ref 8.4–10.5)
CHLORIDE: 105 meq/L (ref 96–112)
CO2: 25 meq/L (ref 19–32)
Creatinine, Ser: 1.15 mg/dL (ref 0.40–1.50)
GFR: 66.63 mL/min (ref >60.00)
GLUCOSE: 106 mg/dL — AB (ref 70–99)
Potassium: 4.7 mEq/L (ref 3.5–5.1)
SODIUM: 138 meq/L (ref 135–145)

## 2017-08-06 ENCOUNTER — Encounter: Payer: Self-pay | Admitting: Family Medicine

## 2017-08-07 NOTE — Progress Notes (Signed)
[name] [address], [place] [address] Phone: [phone] Subjective:     CC: Knee pain follow-up  VQQ:[id number]  Alexander Fitzgerald is a 72 y.o. male coming in with complaint of knee pain.  Patient was found to have severe knee arthritis bilaterally.  Failed all conservative therapy.  Here for second in a series of 4 injections for Visco supplementation.  Patient states that he had 2 days of a lot of pain following the injection. Patient has been walking and downhill is the worst.      Past Medical History:  Diagnosis Date  . Asthma    adult onset, PFTs done in [place]  . Cancer ([place])    Several moles removed in past, all pre-cancerous.  Recently followed by Dr. Nehemiah Massed  . Depression    Treated with Prozac in 1992,  Associate with job loss  . Esophageal reflux   . Gout   . Hiatal hernia   . Hypertension    Past Surgical History:  Procedure Laterality Date  . APPENDECTOMY    . COLONOSCOPY N/A 12/27/2014   Procedure: COLONOSCOPY;  Surgeon: Lollie Sails, MD;  Location: [facility] ENDOSCOPY;  Service: Endoscopy;  Laterality: N/A;  . LAPAROSCOPIC PARAESOPHAGEAL HERNIA REPAIR    . TONSILLECTOMY     Social History   Socioeconomic History  . Marital status: Married    Spouse name: Not on file  . Number of children: Not on file  . Years of education: Not on file  . Highest education level: Not on file  Occupational History  . Not on file  Social Needs  . Financial resource strain: Not on file  . Food insecurity:    Worry: Not on file    Inability: Not on file  . Transportation needs:    Medical: Not on file    Non-medical: Not on file  Tobacco Use  . Smoking status: Former Smoker    Packs/day: 0.50    Years: 20.00    Pack years: 10.00    Types: Cigarettes    Last attempt to quit: 06/15/1999    Years since quitting: 18.1  . Smokeless tobacco: Never Used  Substance and Sexual Activity  . Alcohol use: Yes    Alcohol/week: 0.6 oz     Types: 1 Standard drinks or equivalent per week    Comment: rarely  . Drug use: No  . Sexual activity: Yes  Lifestyle  . Physical activity:    Days per week: Not on file    Minutes per session: Not on file  . Stress: Not on file  Relationships  . Social connections:    Talks on phone: Not on file    Gets together: Not on file    Attends religious service: Not on file    Active member of club or organization: Not on file    Attends meetings of clubs or organizations: Not on file    Relationship status: Not on file  Other Topics Concern  . Not on file  Social History Narrative  . Not on file   Allergies  Allergen Reactions  . Sulfa Drugs Cross Reactors     Blood in urine   Family History  Problem Relation Age of Onset  . Hypertension Mother   . Hyperlipidemia Mother   . Heart attack Father 34       MI  . Cancer Sister      Past medical history, social, surgical and family history  all reviewed in electronic medical record.  No pertanent information unless stated regarding to the chief complaint.   Review of Systems:Review of systems updated and as accurate as of 08/08/17  No headache, visual changes, nausea, vomiting, diarrhea, constipation, dizziness, abdominal pain, skin rash, fevers, chills, night sweats, weight loss, swollen lymph nodes, body aches, , chest pain, shortness of breath, mood changes.  Mild positive joint swelling and muscle aches  Objective  Blood pressure 122/72, pulse 74, height 5\' 11"  (1.803 m), weight 222 lb (100.7 kg), SpO2 97 %. Systems examined below as of 08/08/17   General: No apparent distress alert and oriented x3 mood and affect normal, dressed appropriately.  HEENT: Pupils equal, extraocular movements intact  Respiratory: [name] speak in full sentences and does not appear short of breath  Cardiovascular: No lower extremity edema, non tender, no erythema  Skin: Warm dry intact with no signs of infection or rash on extremities or on  axial skeleton.  Abdomen: Soft nontender  Neuro: Cranial nerves II through XII are intact, neurovascularly intact in all extremities with 2+ DTRs and 2+ pulses.  Lymph: No lymphadenopathy of posterior or anterior cervical chain or axillae bilaterally.  Gait antalgic gait MSK:  Non tender with full range of motion and good stability and symmetric strength and tone of shoulders, elbows, wrist, hip and ankles bilaterally. Knee: Right valgus deformity noted.  Abnormal thigh to calf ratio.  Tender to palpation over medial and PF joint line.  ROM full in flexion and extension and lower leg rotation. instability with valgus force.  painful patellar compression. Patellar glide with moderate crepitus. Patellar and quadriceps tendons unremarkable. Hamstring and quadriceps strength is normal. Contralateral knee has some arthritic changes but no instability.  After informed written and verbal consent, patient was seated on exam table. Right knee was prepped with alcohol swab and utilizing anterolateral approach, patient's right knee space was injected with15 mg/2.5 mL of Orthovisc(sodium hyaluronate) in a prefilled syringe was injected easily into the knee through a 22-gauge needle..Patient tolerated the procedure well without immediate complications.     Impression and Recommendations:     This case required medical decision making of moderate complexity.      Note: This dictation was prepared with Dragon dictation along with smaller phrase technology. Any transcriptional errors that result from this process are unintentional.

## 2017-08-08 ENCOUNTER — Ambulatory Visit: Payer: Medicare HMO | Admitting: Family Medicine

## 2017-08-08 ENCOUNTER — Encounter: Payer: Self-pay | Admitting: Family Medicine

## 2017-08-08 DIAGNOSIS — M1711 Unilateral primary osteoarthritis, right knee: Secondary | ICD-10-CM | POA: Diagnosis not present

## 2017-08-08 NOTE — Patient Instructions (Signed)
Good to see you  Alexander Fitzgerald is your friend.  2 down and one to go

## 2017-08-08 NOTE — Assessment & Plan Note (Signed)
Patient given second in a series of 4 injections.  Discussed icing regimen and home exercises.  Discussed which activities to do which wants to avoid.  Patient is to increase activity as tolerated.  Follow-up in 1 week for third in a series of 4 injections.  Due to patient's abnormal thigh to calf ratio and instability patient will be fitted with a custom brace.

## 2017-08-10 DIAGNOSIS — L821 Other seborrheic keratosis: Secondary | ICD-10-CM | POA: Diagnosis not present

## 2017-08-10 DIAGNOSIS — L718 Other rosacea: Secondary | ICD-10-CM | POA: Diagnosis not present

## 2017-08-10 DIAGNOSIS — Z1283 Encounter for screening for malignant neoplasm of skin: Secondary | ICD-10-CM | POA: Diagnosis not present

## 2017-08-10 DIAGNOSIS — L814 Other melanin hyperpigmentation: Secondary | ICD-10-CM | POA: Diagnosis not present

## 2017-08-10 DIAGNOSIS — L570 Actinic keratosis: Secondary | ICD-10-CM | POA: Diagnosis not present

## 2017-08-10 DIAGNOSIS — Z85828 Personal history of other malignant neoplasm of skin: Secondary | ICD-10-CM | POA: Diagnosis not present

## 2017-08-10 DIAGNOSIS — D1800 Hemangioma unspecified site: Secondary | ICD-10-CM | POA: Diagnosis not present

## 2017-08-10 DIAGNOSIS — D692 Other nonthrombocytopenic purpura: Secondary | ICD-10-CM | POA: Diagnosis not present

## 2017-08-10 DIAGNOSIS — L578 Other skin changes due to chronic exposure to nonionizing radiation: Secondary | ICD-10-CM | POA: Diagnosis not present

## 2017-08-10 DIAGNOSIS — L820 Inflamed seborrheic keratosis: Secondary | ICD-10-CM | POA: Diagnosis not present

## 2017-08-13 NOTE — Progress Notes (Signed)
[facility] [address] [place], [place] [address] Phone: [phone] Subjective:    I'm seeing this patient by the request  of:    CC: Knee pain follow-up  [id number]  Alexander Fitzgerald is a 72 y.o. male coming in with complaint of knee pain.  Patient has severe arthritis of the right knee.  Failed all conservative therapy.  Has been fitted for custom brace but has not gotten it yet.  Here for a third set of a series of 4 injections for Visco supplementation.  Patient states that he is improving slowly.  Noticing less and less pain.  Increasing activity as well.     Past Medical History:  Diagnosis Date  . Asthma    adult onset, PFTs done in [place]  . Cancer ([place])    Several moles removed in past, all pre-cancerous.  Recently followed by Dr. Nehemiah Massed  . Depression    Treated with Prozac in 1992,  Associate with job loss  . Esophageal reflux   . Gout   . Hiatal hernia   . Hypertension    Past Surgical History:  Procedure Laterality Date  . APPENDECTOMY    . COLONOSCOPY N/A 12/27/2014   Procedure: COLONOSCOPY;  Surgeon: Lollie Sails, MD;  Location: [facility] ENDOSCOPY;  Service: Endoscopy;  Laterality: N/A;  . LAPAROSCOPIC PARAESOPHAGEAL HERNIA REPAIR    . TONSILLECTOMY     Social History   Socioeconomic History  . Marital status: Married    Spouse name: Not on file  . Number of children: Not on file  . Years of education: Not on file  . Highest education level: Not on file  Occupational History  . Not on file  Social Needs  . Financial resource strain: Not on file  . Food insecurity:    Worry: Not on file    Inability: Not on file  . Transportation needs:    Medical: Not on file    Non-medical: Not on file  Tobacco Use  . Smoking status: Former Smoker    Packs/day: 0.50    Years: 20.00    Pack years: 10.00    Types: Cigarettes    Last attempt to quit: 06/15/1999    Years since quitting: 18.1  . Smokeless tobacco: Never Used    Substance and Sexual Activity  . Alcohol use: Yes    Alcohol/week: 0.6 oz    Types: 1 Standard drinks or equivalent per week    Comment: rarely  . Drug use: No  . Sexual activity: Yes  Lifestyle  . Physical activity:    Days per week: Not on file    Minutes per session: Not on file  . Stress: Not on file  Relationships  . Social connections:    Talks on phone: Not on file    Gets together: Not on file    Attends religious service: Not on file    Active member of club or organization: Not on file    Attends meetings of clubs or organizations: Not on file    Relationship status: Not on file  Other Topics Concern  . Not on file  Social History Narrative  . Not on file   Allergies  Allergen Reactions  . Sulfa Drugs Cross Reactors     Blood in urine   Family History  Problem Relation Age of Onset  . Hypertension Mother   . Hyperlipidemia Mother   . Heart attack Father 6  MI  . Cancer Sister      Past medical history, social, surgical and family history all reviewed in electronic medical record.  No pertanent information unless stated regarding to the chief complaint.   Review of Systems:Review of systems updated and as accurate as of 08/15/17  No headache, visual changes, nausea, vomiting, diarrhea, constipation, dizziness, abdominal pain, skin rash, fevers, chills, night sweats, weight loss, swollen lymph nodes, body aches, joint swelling, muscle aches, chest pain, shortness of breath, mood changes.   Objective  Blood pressure 140/70, pulse 93, height 5\' 11"  (1.803 m), weight 220 lb (99.8 kg), SpO2 96 %. Systems examined below as of 08/15/17   General: No apparent distress alert and oriented x3 mood and affect normal, dressed appropriately.  HEENT: Pupils equal, extraocular movements intact  Respiratory: [name] speak in full sentences and does not appear short of breath  Cardiovascular: No lower extremity edema, non tender, no erythema  Skin: Warm dry  intact with no signs of infection or rash on extremities or on axial skeleton.  Abdomen: Soft nontender  Neuro: Cranial nerves II through XII are intact, neurovascularly intact in all extremities with 2+ DTRs and 2+ pulses.  Lymph: No lymphadenopathy of posterior or anterior cervical chain or axillae bilaterally.  Gait normal with good balance and coordination.  MSK:  Non tender with full range of motion and good stability and symmetric strength and tone of shoulders, elbows, wrist, hip,  and ankles bilaterally.  Knee: Right valgus deformity noted.  Abnormal thigh to calf ratio.  Tender to palpation over medial and PF joint line.  Improved from previous exam ROM full in flexion and extension and lower leg rotation. instability with valgus force.  painful patellar compression. Patellar glide with moderate crepitus. Patellar and quadriceps tendons unremarkable. Hamstring and quadriceps strength is normal. Contralateral knee shows mild arthritic changes.  After informed written and verbal consent, patient was seated on exam table. Right knee was prepped with alcohol swab and utilizing anterolateral approach, patient's right knee space was injected with15 mg/2.5 mL of Orthovisc(sodium hyaluronate) in a prefilled syringe was injected easily into the knee through a 22-gauge needle..Patient tolerated the procedure well without immediate complications.    Impression and Recommendations:     This case required medical decision making of moderate complexity.      Note: This dictation was prepared with Dragon dictation along with smaller phrase technology. Any transcriptional errors that result from this process are unintentional.

## 2017-08-15 ENCOUNTER — Encounter: Payer: Self-pay | Admitting: Family Medicine

## 2017-08-15 ENCOUNTER — Ambulatory Visit: Payer: Medicare HMO | Admitting: Family Medicine

## 2017-08-15 DIAGNOSIS — M1711 Unilateral primary osteoarthritis, right knee: Secondary | ICD-10-CM | POA: Diagnosis not present

## 2017-08-15 NOTE — Assessment & Plan Note (Signed)
Patient given third in series of 4 injections.  Being fitted for custom brace.  Discussed icing regimen and home exercises.  Discussed which activities of doing which wants to avoid.  Patient will increase activity as tolerated.  Follow-up in 2 week for fourth and final injection.

## 2017-08-16 ENCOUNTER — Other Ambulatory Visit: Payer: Self-pay | Admitting: Family Medicine

## 2017-08-16 DIAGNOSIS — M1711 Unilateral primary osteoarthritis, right knee: Secondary | ICD-10-CM | POA: Diagnosis not present

## 2017-08-17 ENCOUNTER — Ambulatory Visit (INDEPENDENT_AMBULATORY_CARE_PROVIDER_SITE_OTHER): Payer: Medicare HMO | Admitting: *Deleted

## 2017-08-17 VITALS — BP 136/78 | HR 84 | Resp 18

## 2017-08-17 DIAGNOSIS — I10 Essential (primary) hypertension: Secondary | ICD-10-CM | POA: Diagnosis not present

## 2017-08-17 NOTE — Progress Notes (Signed)
Patient came in for 3 - 4 week follow up on BP after changing back to lisinopril 10 mg see nurse visit and E-mail dated 07/21/17-07/22/17. Patient BP taken in left arm 138/78 pulse 77, patient went restroom and returned given few minutes to relax BP taken in right arm 136/78 pulse 84. Patient was advised to continue the medication as prescribed  Until he receives follow up call.  Patient is also continuing his diet and exercise program.

## 2017-08-18 NOTE — Progress Notes (Signed)
Patient aware- appointment  scheduled.  

## 2017-08-28 NOTE — Progress Notes (Signed)
Alexander Fitzgerald, [place] [address] Phone: [phone] Subjective:     CC: Right knee pain  [id number]  Alexander Fitzgerald is a 72 y.o. male coming in with complaint of knee pain. He has been wearing the custom knee brace which did help. He feels the injections are also helping but he is still having pain that won't go away. Pain intensity has improved.      Past Medical History:  Diagnosis Date  . Asthma    adult onset, PFTs done in [place]  . Cancer ([place])    Several moles removed in past, all pre-cancerous.  Recently followed by Dr. Nehemiah Massed  . Depression    Treated with Prozac in 1992,  Associate with job loss  . Esophageal reflux   . Gout   . Hiatal hernia   . Hypertension    Past Surgical History:  Procedure Laterality Date  . APPENDECTOMY    . COLONOSCOPY N/A 12/27/2014   Procedure: COLONOSCOPY;  Surgeon: Lollie Sails, MD;  Location: [facility] ENDOSCOPY;  Service: Endoscopy;  Laterality: N/A;  . LAPAROSCOPIC PARAESOPHAGEAL HERNIA REPAIR    . TONSILLECTOMY     Social History   Socioeconomic History  . Marital status: Married    Spouse name: Not on file  . Number of children: Not on file  . Years of education: Not on file  . Highest education level: Not on file  Occupational History  . Not on file  Social Needs  . Financial resource strain: Not on file  . Food insecurity:    Worry: Not on file    Inability: Not on file  . Transportation needs:    Medical: Not on file    Non-medical: Not on file  Tobacco Use  . Smoking status: Former Smoker    Packs/day: 0.50    Years: 20.00    Pack years: 10.00    Types: Cigarettes    Last attempt to quit: 06/15/1999    Years since quitting: 18.2  . Smokeless tobacco: Never Used  Substance and Sexual Activity  . Alcohol use: Yes    Alcohol/week: 0.6 oz    Types: 1 Standard drinks or equivalent per week    Comment: rarely  . Drug use: No  . Sexual activity: Yes    Lifestyle  . Physical activity:    Days per week: Not on file    Minutes per session: Not on file  . Stress: Not on file  Relationships  . Social connections:    Talks on phone: Not on file    Gets together: Not on file    Attends religious service: Not on file    Active member of club or organization: Not on file    Attends meetings of clubs or organizations: Not on file    Relationship status: Not on file  Other Topics Concern  . Not on file  Social History Narrative  . Not on file   Allergies  Allergen Reactions  . Sulfa Drugs Cross Reactors     Blood in urine   Family History  Problem Relation Age of Onset  . Hypertension Mother   . Hyperlipidemia Mother   . Heart attack Father 55       MI  . Cancer Sister      Past medical history, social, surgical and family history all reviewed in electronic medical record.  No pertanent information unless stated regarding to the chief complaint.   Review  of Systems:Review of systems updated and as accurate as of 08/29/17  No headache, visual changes, nausea, vomiting, diarrhea, constipation, dizziness, abdominal pain, skin rash, fevers, chills, night sweats, weight loss, swollen lymph nodes, body aches, joint swelling,  chest pain, shortness of breath, mood changes.  Positive muscle aches  Objective  Blood pressure (!) 124/92, pulse 80, height 5\' 11"  (1.803 m), weight 218 lb (98.9 kg), SpO2 96 %. Systems examined below as of 08/29/17   General: No apparent distress alert and oriented x3 mood and affect normal, dressed appropriately.  HEENT: Pupils equal, extraocular movements intact  Respiratory: [name] speak in full sentences and does not appear short of breath  Cardiovascular: No lower extremity edema, non tender, no erythema  Skin: Warm dry intact with no signs of infection or rash on extremities or on axial skeleton.  Abdomen: Soft nontender  Neuro: Cranial nerves II through XII are intact, neurovascularly intact in  all extremities with 2+ DTRs and 2+ pulses.  Lymph: No lymphadenopathy of posterior or anterior cervical chain or axillae bilaterally.  Gait normal with good balance and coordination.  MSK:  Non tender with full range of motion and good stability and symmetric strength and tone of shoulders, elbows, wrist, hip, and ankles bilaterally.   Knee: Right valgus deformity noted. Abnormal thigh to calf ratio.  Tender to palpation over medial and PF joint line.  ROM full in flexion and extension and lower leg rotation. instability with valgus force.  painful patellar compression. Patellar glide with moderate crepitus. Patellar and quadriceps tendons unremarkable. Hamstring and quadriceps strength is normal.   After informed written and verbal consent, patient was seated on exam table. Right knee was prepped with alcohol swab and utilizing anterolateral approach, patient's right knee space was injected with15 mg/2.5 mL of Orthovisc(sodium hyaluronate) in a prefilled syringe was injected easily into the knee through a 22-gauge needle..Patient tolerated the procedure well without immediate complications.    Impression and Recommendations:     This case required medical decision making of moderate complexity.      Note: This dictation was prepared with Dragon dictation along with smaller phrase technology. Any transcriptional errors that result from this process are unintentional.

## 2017-08-29 ENCOUNTER — Encounter: Payer: Self-pay | Admitting: Family Medicine

## 2017-08-29 ENCOUNTER — Ambulatory Visit: Payer: Medicare HMO | Admitting: Family Medicine

## 2017-08-29 DIAGNOSIS — M1711 Unilateral primary osteoarthritis, right knee: Secondary | ICD-10-CM | POA: Diagnosis not present

## 2017-08-29 DIAGNOSIS — M1712 Unilateral primary osteoarthritis, left knee: Secondary | ICD-10-CM

## 2017-08-29 NOTE — Patient Instructions (Addendum)
Good to see you  I am sorry you are still hurting.  I would still give it another month and should help some more.  I am hoping for another 20-25% more.  See me again in 4-6 weeks to discuss or write me sooner

## 2017-08-29 NOTE — Assessment & Plan Note (Signed)
Patient finished Visco supplementation.  Follow-up again in 4 weeks.

## 2017-08-31 DIAGNOSIS — G4733 Obstructive sleep apnea (adult) (pediatric): Secondary | ICD-10-CM | POA: Diagnosis not present

## 2017-09-15 IMAGING — US US THYROID
1 series · 13 of 25 positions shown · non-contrast
Comparison: None.

CLINICAL DATA: Neck fullness

EXAM:
THYROID ULTRASOUND
TECHNIQUE: Ultrasound examination of the thyroid gland and adjacent soft
tissues was performed.

[Series 1: us thyroid · 0.09mm/px · 13 of 65 slices shown]
[im 1/65]
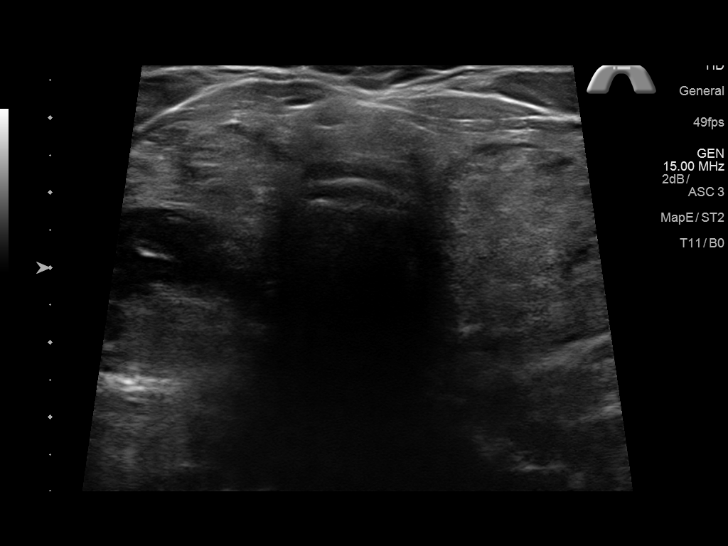
[im 6/65]
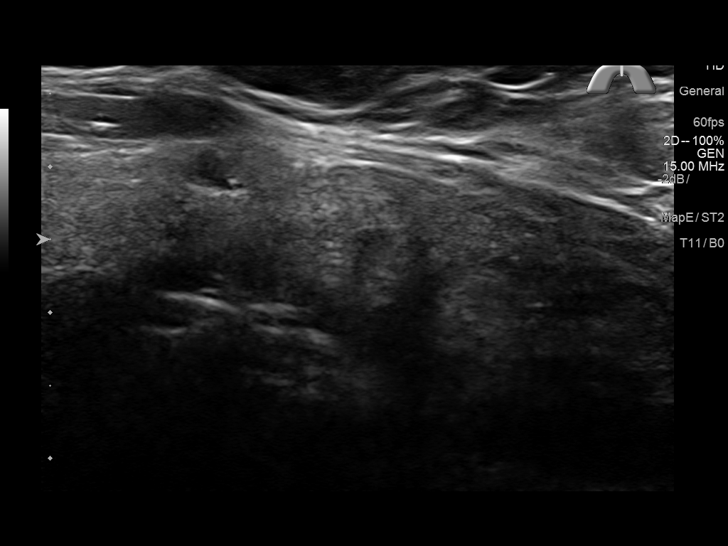
[im 11/65]
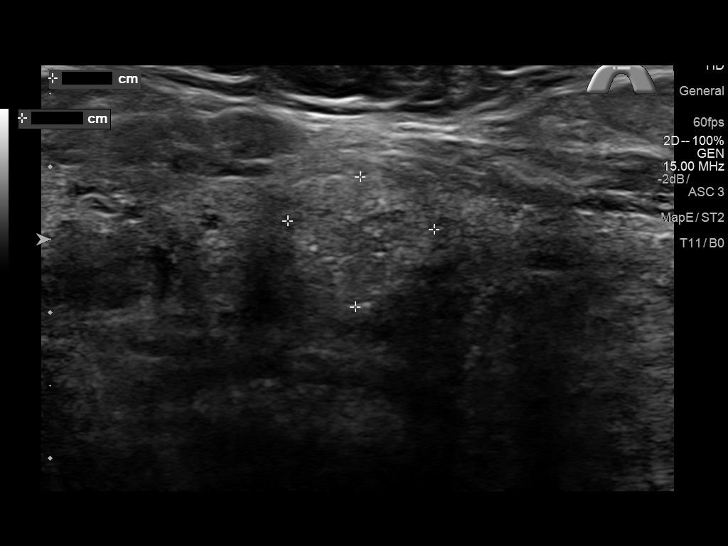
[im 17/65]
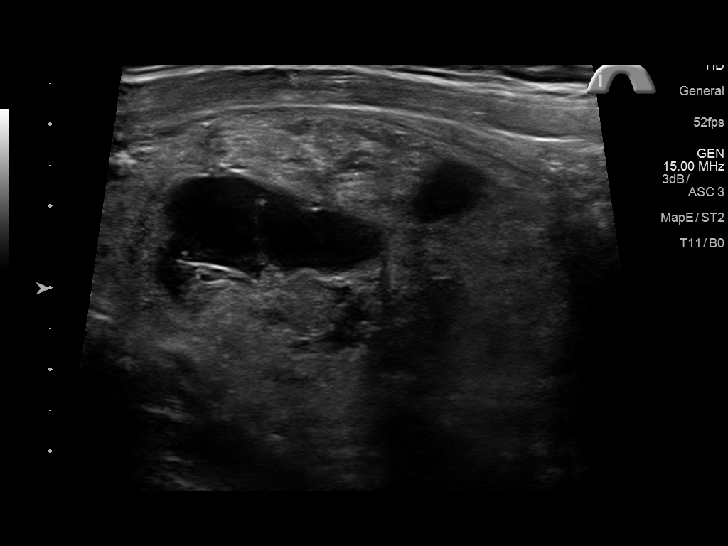
[im 22/65]
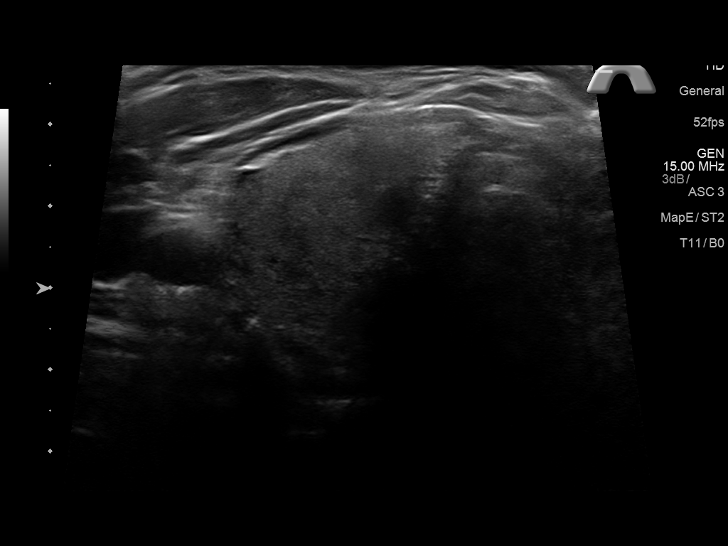
[im 27/65]
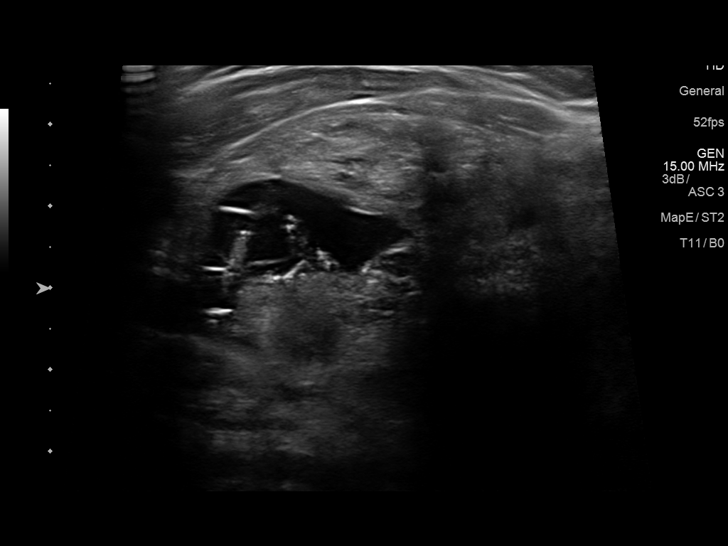
[im 33/65]
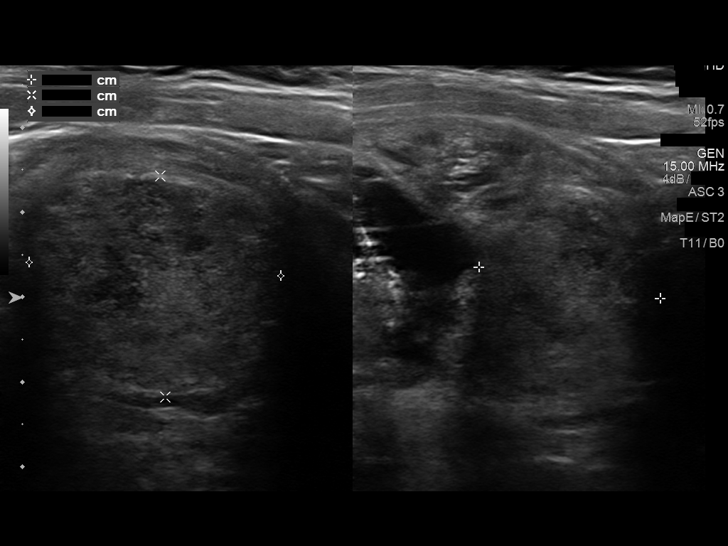
[im 38/65]
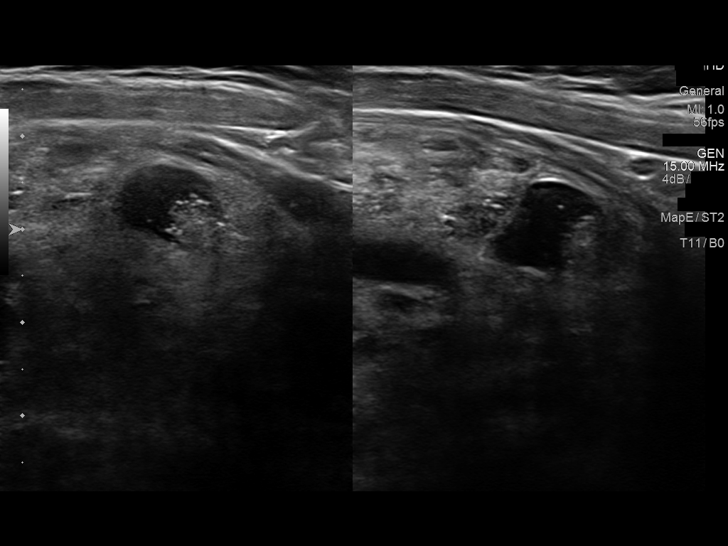
[im 43/65]
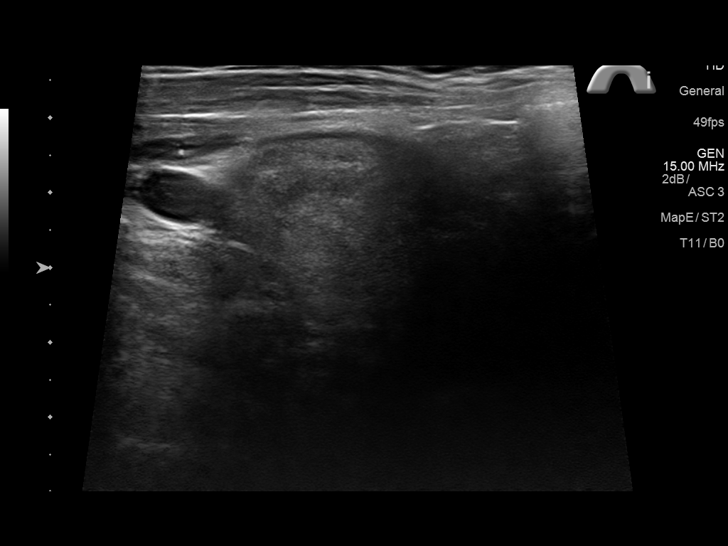
[im 49/65]
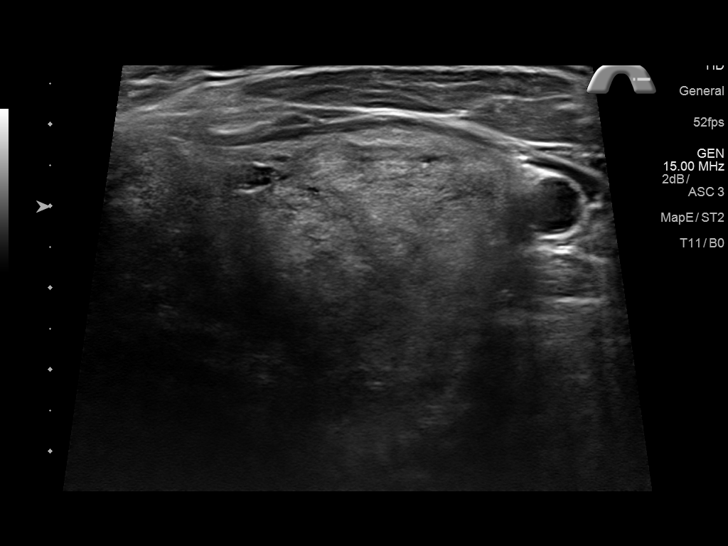
[im 54/65]
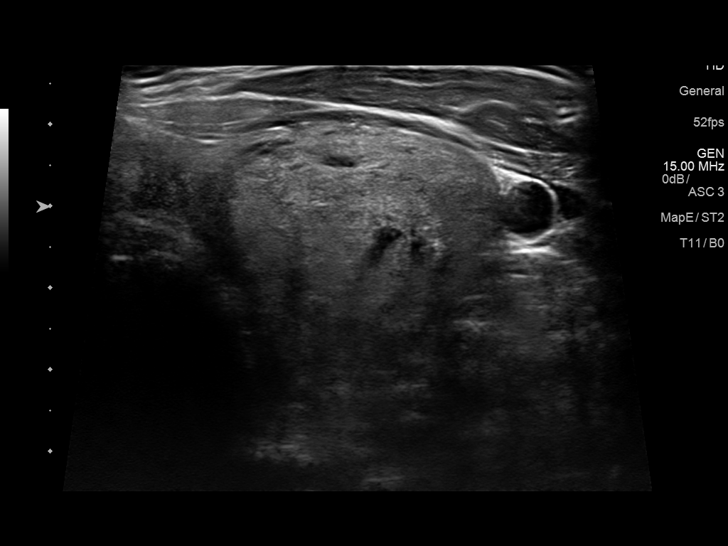
[im 59/65]
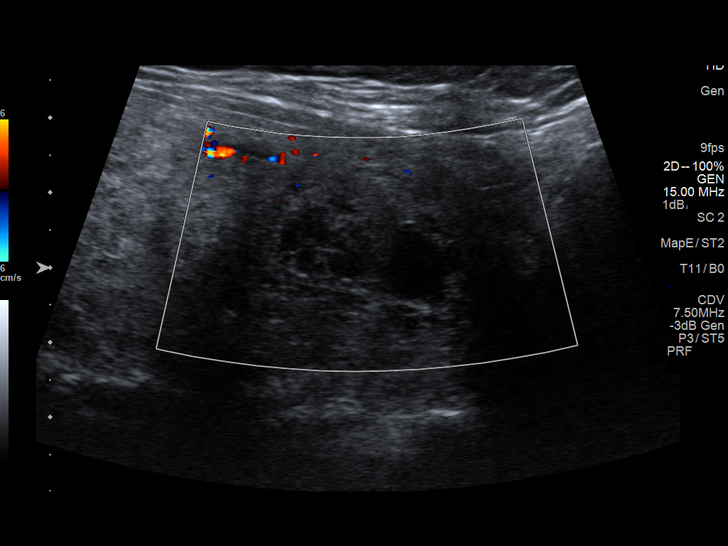
[im 65/65]
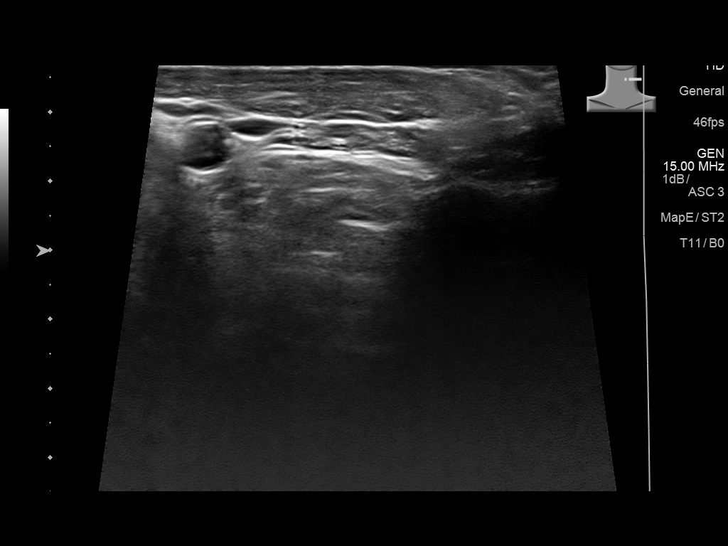

[13 of 25 positions shown; findings below may reference images not displayed]

FINDINGS: Parenchymal Echotexture: Moderately heterogenous

Isthmus: 0.8 cm thickness

Right lobe: 6.6 x 3.2 x 3.6 cm

Left lobe: 7 x 3 x 3.6 cm

_________________________________________________________

Estimated total number of nodules >/= 1 cm: 6-10

Number of spongiform nodules >/=  2 cm not described below (TR1): 1

Number of mixed cystic and solid nodules >/= 1.5 cm not described
below (TR2): 0

Nodule # 1:

Location: Right; Mid

Maximum size: 3   cm; Other 2 dimensions: 2.3 x 3 cm

Composition: mixed cystic and solid (1)

Echogenicity: hypoechoic (2)

Shape: not taller-than-wide (0)

Margins: ill-defined (0)

Echogenic foci: none (0)

ACR TI-RADS total points: 3.

ACR TI-RADS risk category: TR3 (3 points).

ACR TI-RADS recommendations:

**Given size (>/= 2.5 cm) and appearance, fine needle aspiration of
this mildly suspicious nodule should be considered based on TI-RADS
criteria.

Nodule # 2:

Location: Right; Inferior

Maximum size: 3 Cm; Other 2 dimensions: 2.2 x 2.6 cm

Composition: solid/almost completely solid (2)

Echogenicity: hypoechoic (2)

Shape: not taller-than-wide (0)

Margins: ill-defined (0)

Echogenic foci: none (0)

ACR TI-RADS total points: 4.

ACR TI-RADS risk category: TR4 (4-6 points).

ACR TI-RADS recommendations:

**Given size (>/= 1.5 cm) and appearance, fine needle aspiration of
this moderately suspicious nodule should be considered based on
TI-RADS criteria.

Nodule # 3:

Location: Left; Superior

Maximum size: 4.2 cm; Other 2 dimensions: 3.1 x 2.3 cm

Composition: solid/almost completely solid (2)

Echogenicity: hyperechoic (1)

Shape: not taller-than-wide (0)

Margins: ill-defined (0)

Echogenic foci: none (0)

ACR TI-RADS total points: 3.

ACR TI-RADS risk category: TR3 (3 points).

ACR TI-RADS recommendations:

**Given size (>/= 2.5 cm) and appearance, fine needle aspiration of
this mildly suspicious nodule should be considered based on TI-RADS
criteria.

2.8 x 2.8 x 3.1 cm spongiform nodule without calcifications,
inferior left lobe

1 x 1 x 0.9 cm isoechoic nodule without calcifications, inferior
isthmus

1.1 x 1 x 1.2 cm mixed solid cystic nodule without calcifications
will, inferomedial right lobe
IMPRESSION: 1. Thyromegaly with multiple bilateral nodules.
2. Recommend FNA biopsy x2, of moderately suspicious 3 cm inferior
right nodule and larger 4.2 cm superior left mildly suspicious
nodule.
3. Recommend 1 year follow-up surveillance ultrasound of additional
lesions.

The above is in keeping with the ACR TI-RADS recommendations - [HOSPITAL] 9565;[DATE].

## 2017-09-20 ENCOUNTER — Encounter: Payer: Self-pay | Admitting: Family Medicine

## 2017-09-20 DIAGNOSIS — I10 Essential (primary) hypertension: Secondary | ICD-10-CM

## 2017-09-20 DIAGNOSIS — G4733 Obstructive sleep apnea (adult) (pediatric): Secondary | ICD-10-CM | POA: Diagnosis not present

## 2017-09-20 DIAGNOSIS — E890 Postprocedural hypothyroidism: Secondary | ICD-10-CM | POA: Diagnosis not present

## 2017-09-20 DIAGNOSIS — E875 Hyperkalemia: Secondary | ICD-10-CM | POA: Insufficient documentation

## 2017-09-22 ENCOUNTER — Other Ambulatory Visit: Payer: Self-pay | Admitting: Family Medicine

## 2017-09-28 ENCOUNTER — Other Ambulatory Visit (INDEPENDENT_AMBULATORY_CARE_PROVIDER_SITE_OTHER): Payer: Medicare HMO

## 2017-09-28 ENCOUNTER — Encounter: Payer: Self-pay | Admitting: Family Medicine

## 2017-09-28 DIAGNOSIS — I10 Essential (primary) hypertension: Secondary | ICD-10-CM | POA: Diagnosis not present

## 2017-09-28 LAB — BASIC METABOLIC PANEL
BUN: 18 mg/dL (ref 6–23)
CALCIUM: 9.7 mg/dL (ref 8.4–10.5)
CO2: 28 mEq/L (ref 19–32)
Chloride: 104 mEq/L (ref 96–112)
Creatinine, Ser: 1.15 mg/dL (ref 0.40–1.50)
GFR: 66.6 mL/min (ref >60.00)
Glucose, Bld: 87 mg/dL (ref 70–99)
POTASSIUM: 4.5 meq/L (ref 3.5–5.1)
Sodium: 137 mEq/L (ref 135–145)

## 2017-09-30 ENCOUNTER — Encounter: Payer: Self-pay | Admitting: Family Medicine

## 2017-10-02 NOTE — Progress Notes (Signed)
[name] [address], [place] [address] Phone: [phone] Subjective:    CC: Knee pain  YBO:[id number]  Alexander Fitzgerald is a 72 y.o. male coming in with complaint of knee pain.  Patient has redness noted significant arthritis as well as some left-sided arthritis.  Started Orthovisc on the right knee.  Patient states that he is only about 30 to 40% better now 6 weeks after the series.  Starting to wear his custom brace on a more regular basis.  Still having some instability and swelling.  Considering replacement in the near future.  Most of patient's arthritis is all in the medial compartment    Past Medical History:  Diagnosis Date  . Asthma    adult onset, PFTs done in [place]  . Cancer ([place])    Several moles removed in past, all pre-cancerous.  Recently followed by Dr. Nehemiah Massed  . Depression    Treated with Prozac in 1992,  Associate with job loss  . Esophageal reflux   . Gout   . Hiatal hernia   . Hypertension    Past Surgical History:  Procedure Laterality Date  . APPENDECTOMY    . COLONOSCOPY N/A 12/27/2014   Procedure: COLONOSCOPY;  Surgeon: Lollie Sails, MD;  Location: [facility];  Service: Endoscopy;  Laterality: N/A;  . LAPAROSCOPIC PARAESOPHAGEAL HERNIA REPAIR    . TONSILLECTOMY     Social History   Socioeconomic History  . Marital status: Married    Spouse name: Not on file  . Number of children: Not on file  . Years of education: Not on file  . Highest education level: Not on file  Occupational History  . Not on file  Social Needs  . Financial resource strain: Not on file  . Food insecurity:    Worry: Not on file    Inability: Not on file  . Transportation needs:    Medical: Not on file    Non-medical: Not on file  Tobacco Use  . Smoking status: Former Smoker    Packs/day: 0.50    Years: 20.00    Pack years: 10.00    Types: Cigarettes    Last attempt to quit: 06/15/1999    Years since quitting: 18.3    . Smokeless tobacco: Never Used  Substance and Sexual Activity  . Alcohol use: Yes    Alcohol/week: 0.6 oz    Types: 1 Standard drinks or equivalent per week    Comment: rarely  . Drug use: No  . Sexual activity: Yes  Lifestyle  . Physical activity:    Days per week: Not on file    Minutes per session: Not on file  . Stress: Not on file  Relationships  . Social connections:    Talks on phone: Not on file    Gets together: Not on file    Attends religious service: Not on file    Active member of club or organization: Not on file    Attends meetings of clubs or organizations: Not on file    Relationship status: Not on file  Other Topics Concern  . Not on file  Social History Narrative  . Not on file   Allergies  Allergen Reactions  . Sulfa Drugs Cross Reactors     Blood in urine   Family History  Problem Relation Age of Onset  . Hypertension Mother   . Hyperlipidemia Mother   . Heart attack Father 32  MI  . Cancer Sister      Past medical history, social, surgical and family history all reviewed in electronic medical record.  No pertanent information unless stated regarding to the chief complaint.   Review of Systems:Review of systems updated and as accurate as of 10/03/17  No headache, visual changes, nausea, vomiting, diarrhea, constipation, dizziness, abdominal pain, skin rash, fevers, chills, night sweats, weight loss, swollen lymph nodes, body aches,  chest pain, shortness of breath, mood changes.  Positive muscle aches and joint swelling  Objective  Blood pressure (!) 124/92, pulse (!) 106, height 5\' 11"  (1.803 m), weight 215 lb (97.5 kg), SpO2 96 %. Systems examined below as of 10/03/17   General: No apparent distress alert and oriented x3 mood and affect normal, dressed appropriately.  HEENT: Pupils equal, extraocular movements intact  Respiratory: [name] speak in full sentences and does not appear short of breath  Cardiovascular: No lower  extremity edema, non tender, no erythema  Skin: Warm dry intact with no signs of infection or rash on extremities or on axial skeleton.  Abdomen: Soft nontender  Neuro: Cranial nerves II through XII are intact, neurovascularly intact in all extremities with 2+ DTRs and 2+ pulses.  Lymph: No lymphadenopathy of posterior or anterior cervical chain or axillae bilaterally.  Gait normal with good balance and coordination.  MSK:  Non tender with full range of motion and good stability and symmetric strength and tone of shoulders, elbows, wrist, hip, and ankles bilaterally.  Knee: Right valgus deformity noted.  Abnormal thigh to calf ratio.  Tender to palpation over medial joint line ROM full in flexion and extension and lower leg rotation. instability with valgus force.  painful patellar compression. Patellar glide with moderate crepitus. Patellar and quadriceps tendons unremarkable. Hamstring and quadriceps strength is normal. Contralateral knee shows mild arthritic changes    Impression and Recommendations:     This case required medical decision making of moderate complexity.      Note: This dictation was prepared with Dragon dictation along with smaller phrase technology. Any transcriptional errors that result from this process are unintentional.

## 2017-10-03 ENCOUNTER — Encounter: Payer: Self-pay | Admitting: Family Medicine

## 2017-10-03 ENCOUNTER — Ambulatory Visit (INDEPENDENT_AMBULATORY_CARE_PROVIDER_SITE_OTHER): Payer: Medicare HMO | Admitting: Family Medicine

## 2017-10-03 ENCOUNTER — Ambulatory Visit (INDEPENDENT_AMBULATORY_CARE_PROVIDER_SITE_OTHER)
Admission: RE | Admit: 2017-10-03 | Discharge: 2017-10-03 | Disposition: A | Discharge status: Home / Self Care | Payer: Medicare HMO | Source: Ambulatory Visit | Attending: Family Medicine | Admitting: Family Medicine

## 2017-10-03 VITALS — BP 124/92 | HR 106 | Ht 71.0 in | Wt 215.0 lb

## 2017-10-03 DIAGNOSIS — M25561 Pain in right knee: Secondary | ICD-10-CM

## 2017-10-03 DIAGNOSIS — G8929 Other chronic pain: Secondary | ICD-10-CM

## 2017-10-03 DIAGNOSIS — M1711 Unilateral primary osteoarthritis, right knee: Secondary | ICD-10-CM

## 2017-10-03 DIAGNOSIS — M170 Bilateral primary osteoarthritis of knee: Secondary | ICD-10-CM | POA: Diagnosis not present

## 2017-10-03 NOTE — Patient Instructions (Signed)
Good to see you  Think about your options.  Read about PRP  Continue the brace Rise Mu and Frederik Pear  Can inject knee if it gets bad See me when you need me

## 2017-10-03 NOTE — Assessment & Plan Note (Signed)
Patient does have more of a arthritic changes still noted.  Patient did not respond as well as patient was wanting with the Visco supplementation.  Discussed icing regimen and home exercise.  Discussed which activities of doing which wants to avoid.  Patient will increase activity.  Worsening symptoms patient will consider the other potential injections.  We discussed other possibilities as well.  Discussed the possibility of checking with an orthopedic surgeon about the replacement.  Spent  25 minutes with patient face-to-face and had greater than 50% of counseling including as described above in assessment and plan.

## 2017-10-22 ENCOUNTER — Encounter: Payer: Self-pay | Admitting: Family Medicine

## 2017-11-07 ENCOUNTER — Encounter: Payer: Self-pay | Admitting: Family Medicine

## 2017-11-07 ENCOUNTER — Telehealth: Payer: Self-pay | Admitting: Family Medicine

## 2017-11-07 NOTE — Telephone Encounter (Signed)
Copied from [place] [id number]. Topic: Quick Communication - See Telephone Encounter >> Nov 07, 2017 11:03 AM Rutherford Nail, NT wrote: CRM for notification. See Telephone encounter for: 11/07/17. Patient calling and states that he needs his knee x-rays from his visit with Dr Tamala Julian sent to Dr Frederik Pear at [place]. Please advise. CB#: [phone]

## 2017-11-07 NOTE — Telephone Encounter (Signed)
Xray results faxed

## 2017-11-15 ENCOUNTER — Ambulatory Visit: Payer: Medicare HMO | Admitting: Family Medicine

## 2017-11-15 DIAGNOSIS — S83241A Other tear of medial meniscus, current injury, right knee, initial encounter: Secondary | ICD-10-CM | POA: Diagnosis not present

## 2017-11-16 ENCOUNTER — Encounter: Payer: Self-pay | Admitting: Family Medicine

## 2017-11-16 ENCOUNTER — Ambulatory Visit (INDEPENDENT_AMBULATORY_CARE_PROVIDER_SITE_OTHER): Payer: Medicare HMO | Admitting: Family Medicine

## 2017-11-16 DIAGNOSIS — M1711 Unilateral primary osteoarthritis, right knee: Secondary | ICD-10-CM | POA: Diagnosis not present

## 2017-11-16 DIAGNOSIS — I10 Essential (primary) hypertension: Secondary | ICD-10-CM | POA: Diagnosis not present

## 2017-11-16 DIAGNOSIS — L719 Rosacea, unspecified: Secondary | ICD-10-CM | POA: Diagnosis not present

## 2017-11-16 DIAGNOSIS — E7800 Pure hypercholesterolemia, unspecified: Secondary | ICD-10-CM

## 2017-11-16 MED ORDER — LISINOPRIL 5 MG PO TABS: 5.0000 mg | ORAL_TABLET | Freq: Every day | ORAL | 1 refills | Status: DC

## 2017-11-16 NOTE — Assessment & Plan Note (Signed)
Well-controlled.  Continue lisinopril. 

## 2017-11-16 NOTE — Assessment & Plan Note (Signed)
He will continue to see his dermatologist. 

## 2017-11-16 NOTE — Assessment & Plan Note (Signed)
Adequately controlled.  He will continue dietary changes and exercise.

## 2017-11-16 NOTE — Progress Notes (Signed)
  [name], MD Phone: [phone]  Alexander Fitzgerald is a 72 y.o. male who presents today for f/u.  CC: htn, hld, rosacea, right knee pain  HYPERTENSION  Disease Monitoring  Home BP Monitoring 124-132/73-82 Chest pain- no    Dyspnea- no Medications  Compliance-  Taking lisinopril 5 mg daily, torsemide 5 mg daily.  Edema- chronic and unchanged  HLD: He is been walking 3 miles daily.  He has "gotten rid of the crap food."  His weight has been trending down.  Right knee pain: He saw Dr. Tamala Julian for this and got injections.  He was subsequently referred to orthopedics to consider knee replacement given his discomfort though it sounds like orthopedics felt it may be more likely a meniscal tear.  He is going to have an MRI.  He does note it is fairly significant at 8 out of 10.  He is wearing a brace for this.  Rosacea: He sees dermatology twice yearly.  They placed him on doxycycline as he was getting pustules.  Notes his rosacea is well controlled now.  He will use MetroGel or dapsone for flares.  Social History   Tobacco Use  Smoking Status Former Smoker  . Packs/day: 0.50  . Years: 20.00  . Pack years: 10.00  . Types: Cigarettes  . Last attempt to quit: 06/15/1999  . Years since quitting: 18.4  Smokeless Tobacco Never Used     ROS see history of present illness  Objective  Physical Exam Vitals:   11/16/17 1100  BP: 128/70  Pulse: 85  Temp: 98.1 F (36.7 C)  SpO2: 95%    BP Readings from Last 3 Encounters:  11/16/17 128/70  10/03/17 (!) 124/92  08/29/17 (!) 124/92   Wt Readings from Last 3 Encounters:  11/16/17 213 lb 6.4 oz (96.8 kg)  10/03/17 215 lb (97.5 kg)  08/29/17 218 lb (98.9 kg)    Physical Exam  Constitutional: No distress.  Cardiovascular: Normal rate, regular rhythm and normal heart sounds.  Pulmonary/Chest: Effort normal and breath sounds normal.  Musculoskeletal: He exhibits no edema.  Right knee with brace in place, medial joint line  tenderness  Neurological: He is alert.  Skin: Skin is warm and dry. He is not diaphoretic.     Assessment/Plan: Please see individual problem list.  Pure hypercholesterolemia Adequately controlled.  He will continue dietary changes and exercise.  Hypertension Well-controlled.  Continue lisinopril.  Rosacea He will continue to see his dermatologist.  Degenerative arthritis of right knee Patient has seen orthopedics.  They are planning MRI to evaluate for possible meniscal tear.  He will proceed with evaluation through them.   No orders of the defined types were placed in this encounter.   Meds ordered this encounter  Medications  . lisinopril (PRINIVIL,ZESTRIL) 5 MG tablet    Sig: Take 1 tablet (5 mg total) by mouth daily.    Dispense:  90 tablet    Refill:  1     [name], MD [place]

## 2017-11-16 NOTE — Patient Instructions (Signed)
Nice to see you. Please continue to monitor your blood pressure. Please continue to stay active and monitor your diet.

## 2017-11-16 NOTE — Assessment & Plan Note (Signed)
Patient has seen orthopedics.  They are planning MRI to evaluate for possible meniscal tear.  He will proceed with evaluation through them.

## 2017-11-21 DIAGNOSIS — M25561 Pain in right knee: Secondary | ICD-10-CM | POA: Diagnosis not present

## 2017-11-22 ENCOUNTER — Other Ambulatory Visit: Payer: Self-pay | Admitting: Family Medicine

## 2017-12-01 DIAGNOSIS — R609 Edema, unspecified: Secondary | ICD-10-CM | POA: Diagnosis not present

## 2017-12-01 DIAGNOSIS — S83281A Other tear of lateral meniscus, current injury, right knee, initial encounter: Secondary | ICD-10-CM | POA: Diagnosis not present

## 2017-12-01 DIAGNOSIS — M25461 Effusion, right knee: Secondary | ICD-10-CM | POA: Diagnosis not present

## 2017-12-04 DIAGNOSIS — G4733 Obstructive sleep apnea (adult) (pediatric): Secondary | ICD-10-CM | POA: Diagnosis not present

## 2017-12-05 DIAGNOSIS — Z01812 Encounter for preprocedural laboratory examination: Secondary | ICD-10-CM | POA: Diagnosis not present

## 2017-12-05 DIAGNOSIS — S83231A Complex tear of medial meniscus, current injury, right knee, initial encounter: Secondary | ICD-10-CM | POA: Diagnosis not present

## 2017-12-05 DIAGNOSIS — I4510 Unspecified right bundle-branch block: Secondary | ICD-10-CM | POA: Diagnosis not present

## 2017-12-05 DIAGNOSIS — Z01810 Encounter for preprocedural cardiovascular examination: Secondary | ICD-10-CM | POA: Diagnosis not present

## 2017-12-13 ENCOUNTER — Encounter: Payer: Self-pay | Admitting: Family Medicine

## 2017-12-13 ENCOUNTER — Ambulatory Visit (INDEPENDENT_AMBULATORY_CARE_PROVIDER_SITE_OTHER): Payer: Medicare HMO | Admitting: Family Medicine

## 2017-12-13 VITALS — BP 120/80 | HR 85 | Temp 98.5°F | Wt 214.6 lb

## 2017-12-13 DIAGNOSIS — Z01818 Encounter for other preprocedural examination: Secondary | ICD-10-CM

## 2017-12-13 DIAGNOSIS — S93402A Sprain of unspecified ligament of left ankle, initial encounter: Secondary | ICD-10-CM

## 2017-12-13 DIAGNOSIS — S93409A Sprain of unspecified ligament of unspecified ankle, initial encounter: Secondary | ICD-10-CM | POA: Insufficient documentation

## 2017-12-13 NOTE — Progress Notes (Signed)
  [name], MD Phone: [phone]  Alexander Fitzgerald is a 72 y.o. male who presents today for f/u.  CC: surgical clearance  Patient presents for surgical clearance visit for a partial medial meniscectomy and partial lateral meniscectomy.  He reports recently having an EKG and lab work through the orthopedists office.  He denies any issues with chest pain or breathlessness when climbing 2 flights of stairs at a normal speed.  He has no history of kidney disease, heart attack, palpitations, stroke, anesthesia issues, seizures, angina, liver disease, heart failure, or diabetes or bronchitis.  He does have asthma though this is well controlled and he is not on any controller medications consistently.  He takes Advair only as needed with colds.  No albuterol use anytime recently.  His most recent TSH is in the normal range and he does take his Synthroid.  He does note he rolled his left ankle 1 to 2 months ago and sprained it.  That has improved and he has no residual pain.  Social History   Tobacco Use  Smoking Status Former Smoker  . Packs/day: 0.50  . Years: 20.00  . Pack years: 10.00  . Types: Cigarettes  . Last attempt to quit: 06/15/1999  . Years since quitting: 18.5  Smokeless Tobacco Never Used     ROS see history of present illness  Objective  Physical Exam Vitals:   12/13/17 1334  BP: 120/80  Pulse: 85  Temp: 98.5 F (36.9 C)  SpO2: 95%    BP Readings from Last 3 Encounters:  12/13/17 120/80  11/16/17 128/70  10/03/17 (!) 124/92   Wt Readings from Last 3 Encounters:  12/13/17 214 lb 9.6 oz (97.3 kg)  11/16/17 213 lb 6.4 oz (96.8 kg)  10/03/17 215 lb (97.5 kg)    Physical Exam  Constitutional: No distress.  HENT:  Mouth/Throat: Oropharynx is clear and moist.  Cardiovascular: Normal rate, regular rhythm and normal heart sounds.  Pulmonary/Chest: Effort normal and breath sounds normal.  Abdominal: Soft. Bowel sounds are normal. He exhibits no  distension. There is no tenderness.  Musculoskeletal: He exhibits no edema.  Left ankle with no malleoli or tenderness, navicular tenderness, or fifth head of metatarsal tenderness, no warmth or significant swelling, 2+ DP pulse  Neurological: He is alert.  Skin: Skin is warm and dry. He is not diaphoretic.     Assessment/Plan: Please see individual problem list.  Preop examination Patient presents for preoperative exam.  We will request his EKG and lab work and then determine if he can be cleared for surgery.  Ankle sprain This issue has improved.  He will monitor for any additional issues with his left ankle.    No orders of the defined types were placed in this encounter.   No orders of the defined types were placed in this encounter.    [name], MD [place]

## 2017-12-13 NOTE — Patient Instructions (Signed)
Nice to see you. We will request your lab work from your surgeon in the EKG and once I review that we will be able to sign off on your surgical clearance.

## 2017-12-13 NOTE — Assessment & Plan Note (Signed)
This issue has improved.  He will monitor for any additional issues with his left ankle.

## 2017-12-13 NOTE — Assessment & Plan Note (Signed)
Patient presents for preoperative exam.  We will request his EKG and lab work and then determine if he can be cleared for surgery.

## 2017-12-14 DIAGNOSIS — H903 Sensorineural hearing loss, bilateral: Secondary | ICD-10-CM | POA: Diagnosis not present

## 2017-12-16 ENCOUNTER — Ambulatory Visit (INDEPENDENT_AMBULATORY_CARE_PROVIDER_SITE_OTHER): Payer: Medicare HMO

## 2017-12-16 VITALS — BP 124/72 | HR 85 | Temp 98.5°F | Resp 14 | Ht 70.0 in | Wt 213.4 lb

## 2017-12-16 DIAGNOSIS — Z0000 Encounter for general adult medical examination without abnormal findings: Secondary | ICD-10-CM | POA: Diagnosis not present

## 2017-12-16 NOTE — Patient Instructions (Addendum)
  [name] , Thank you for taking time to come for your Medicare Wellness Visit. I appreciate your ongoing commitment to your health goals. Please review the following plan we discussed and let me know if I can assist you in the future.   Follow up as needed.    Bring a copy of your [place] and/or Living Will to be scanned into chart.  Have a great day!  These are the goals we discussed: Goals    . Weight 190lb       This is a list of the screening recommended for you and due dates:  Health Maintenance  Topic Date Due  . Flu Shot  11/17/2017  . Tetanus Vaccine  04/27/2019  . Colon Cancer Screening  12/26/2024  .  Hepatitis C: One time screening is recommended by Center for Disease Control  (CDC) for  adults born from [birth year] through 1964.   Completed  . Pneumonia vaccines  Completed

## 2017-12-16 NOTE — Progress Notes (Signed)
Subjective:   Alexander Fitzgerald is a 72 y.o. male who presents for Medicare Annual/Subsequent preventive examination.  Review of Systems:  No ROS.  Medicare Wellness Visit. Additional risk factors are reflected in the social history.  Cardiac Risk Factors include: advanced age (>49men, >75 women);male gender;hypertension     Objective:    Vitals: BP 124/72 (BP Location: Left Arm, Patient Position: Sitting, Cuff Size: Normal)   Pulse 85   Temp 98.5 F (36.9 C) (Oral)   Resp 14   Ht 5\' 10"  (1.778 m)   Wt 213 lb 6.4 oz (96.8 kg)   SpO2 94%   BMI 30.62 kg/m   Body mass index is 30.62 kg/m.  Advanced Directives 12/16/2017 05/05/2015  Does Patient Have a Medical Advance Directive? Yes Yes  Type of Paramedic of [place];Living will [place];Living will  Does patient want to make changes to medical advance directive? No - Patient declined No - Patient declined  Copy of [place] in Chart? No - copy requested No - copy requested    Tobacco Social History   Tobacco Use  Smoking Status Former Smoker  . Packs/day: 0.50  . Years: 20.00  . Pack years: 10.00  . Types: Cigarettes  . Last attempt to quit: 06/15/1999  . Years since quitting: 18.5  Smokeless Tobacco Never Used     Counseling given: Not Answered   Clinical Intake:  Pre-visit preparation completed: Yes  Pain : 0-10 Pain Score: 2  Pain Type: Chronic pain Pain Location: Knee Pain Orientation: Right Pain Onset: More than a month ago Pain Frequency: Intermittent Pain Relieving Factors: [place]. Rest. Upcoming surgery. Followed by pcp.  Effect of Pain on Daily Activities: It does not interfere with daily activities.   Pain Relieving Factors: [place]. Rest. Upcoming surgery. Followed by pcp.   Nutritional Status: BMI 25 -29 Overweight Diabetes: No  How often do you need to have someone help you when you read instructions, pamphlets, or other  written materials from your doctor or pharmacy?: 1 - Never  Interpreter Needed?: No     Past Medical History:  Diagnosis Date  . Asthma    adult onset, PFTs done in [place]  . Cancer ([place])    Several moles removed in past, all pre-cancerous.  Recently followed by Dr. Nehemiah Massed  . Depression    Treated with Prozac in 1992,  Associate with job loss  . Esophageal reflux   . Gout   . Hiatal hernia   . Hypertension    Past Surgical History:  Procedure Laterality Date  . APPENDECTOMY    . COLONOSCOPY N/A 12/27/2014   Procedure: COLONOSCOPY;  Surgeon: Lollie Sails, MD;  Location: [facility] ENDOSCOPY;  Service: Endoscopy;  Laterality: N/A;  . LAPAROSCOPIC PARAESOPHAGEAL HERNIA REPAIR    . TONSILLECTOMY     Family History  Problem Relation Age of Onset  . Hypertension Mother   . Hyperlipidemia Mother   . Diabetes Mother   . Heart attack Father 30       MI  . Hypertension Father   . Cancer Sister        pancreatic   Social History   Socioeconomic History  . Marital status: Married    Spouse name: Not on file  . Number of children: Not on file  . Years of education: Not on file  . Highest education level: Not on file  Occupational History  . Not on file  Social Needs  .  Financial resource strain: Not hard at all  . Food insecurity:    Worry: Never true    Inability: Never true  . Transportation needs:    Medical: No    Non-medical: No  Tobacco Use  . Smoking status: Former Smoker    Packs/day: 0.50    Years: 20.00    Pack years: 10.00    Types: Cigarettes    Last attempt to quit: 06/15/1999    Years since quitting: 18.5  . Smokeless tobacco: Never Used  Substance and Sexual Activity  . Alcohol use: Yes    Alcohol/week: 1.0 standard drinks    Types: 1 Standard drinks or equivalent per week    Comment: rarely  . Drug use: No  . Sexual activity: Yes  Lifestyle  . Physical activity:    Days per week: Not on file    Minutes per session: Not on file  . Stress: Not on  file  Relationships  . Social connections:    Talks on phone: Not on file    Gets together: Not on file    Attends religious service: Not on file    Active member of club or organization: Not on file    Attends meetings of clubs or organizations: Not on file    Relationship status: Not on file  Other Topics Concern  . Not on file  Social History Narrative  . Not on file    Outpatient Encounter Medications as of 12/16/2017  Medication Sig  . Ascorbic Acid (VITAMIN C) 1000 MG tablet Take 1,000 mg by mouth daily. Reported on 06/26/2015  . aspirin 81 MG chewable tablet Chew by mouth daily.  . Cholecalciferol (VITAMIN D3) 2000 units capsule Take 4,000 Units by mouth.  . Cyanocobalamin (VITAMIN B-12) 5000 MCG SUBL Place 2 each under the tongue daily.    . Dapsone (ACZONE) 5 % topical gel Aczone 5 % topical gel  APPLY A SMALL AMOUNT TO AFFECTED AREA EVERY MORNING  . docusate sodium (COLACE) 100 MG capsule Take 100 mg by mouth daily.  Marland Kitchen doxycycline (VIBRAMYCIN) 50 MG capsule Take 50 mg by mouth daily.  . finasteride (PROSCAR) 5 MG tablet TAKE 1 TABLET BY MOUTH EVERY DAY  . Fluticasone-Salmeterol (ADVAIR DISKUS) 250-50 MCG/DOSE AEPB Inhale into the lungs as needed. Inhale 1 puff by mouth as needed  . levothyroxine (SYNTHROID, LEVOTHROID) 112 MCG tablet Take 1 tablet (112 mcg total) by mouth daily.  Marland Kitchen lisinopril (PRINIVIL,ZESTRIL) 5 MG tablet Take 1 tablet (5 mg total) by mouth daily.  . meclofenamate (MECLOMEN) 100 MG capsule Take 100 mg by mouth every 6 (six) hours.  . metroNIDAZOLE (METROGEL) 1 % gel metronidazole 1 % topical gel  APPLY TO AFFECTED AREA EVERY DAY  . montelukast (SINGULAIR) 10 MG tablet TAKE 1 TABLET BY MOUTH EVERYDAY AT BEDTIME  . Multiple Vitamin (MULTIVITAMIN) capsule Take 1 capsule by mouth daily.    Marland Kitchen oxymetazoline (AFRIN) 0.05 % nasal spray Place 1 spray into both nostrils 2 (two) times daily.  . pantoprazole (PROTONIX) 40 MG tablet TAKE 1 TABLET BY MOUTH EVERY DAY  BEFORE A MEAL AS DIRECTED  . PROAIR HFA 108 (90 Base) MCG/ACT inhaler TAKE 2 PUFFS 4 TIMES A DAY AS NEEDED  . Pyridoxine HCl (VITAMIN B-6 CR) 200 MG TBCR Take by mouth.    . torsemide (DEMADEX) 5 MG tablet Take 5 mg by mouth daily.   No facility-administered encounter medications on file as of 12/16/2017.     Activities of Daily Living  In your present state of health, do you have any difficulty performing the following activities: 12/16/2017  Hearing? N  Vision? N  Difficulty concentrating or making decisions? N  Walking or climbing stairs? Y  Dressing or bathing? N  Doing errands, shopping? N  Preparing Food and eating ? N  Using the Toilet? N  In the past six months, have you accidently leaked urine? N  Do you have problems with loss of bowel control? N  Managing your Medications? N  Managing your Finances? N  Housekeeping or managing your Housekeeping? N  Some recent data might be hidden    Patient Care Team: Leone Haven, MD as PCP - General (Family Medicine) Minna Merritts, MD as Consulting Physician (Cardiology)   Assessment:   This is a routine wellness examination for [place].  The goal of the wellness visit is to assist the patient how to close the gaps in care and create a preventative care plan for the patient.   The roster of all physicians providing medical care to patient is listed in the Snapshot section of the chart.  Taking calcium VIT D as appropriate/Osteoporosis risk reviewed.    Safety issues reviewed; Smoke and carbon monoxide detectors in the home. No firearms in the home. Wears seatbelts when driving or riding with others. No violence in the home.  They do not have excessive sun exposure.  Discussed the need for sun protection: hats, long sleeves and the use of sunscreen if there is significant sun exposure.  Patient is alert, normal appearance, oriented to person/place/and time.  Correctly identified the president of the [place] and recalls of  3/3 words. Performs simple calculations and can read correct time from watch face.  Displays appropriate judgement.  No new identified risk were noted.  No failures at ADL's or IADL's.    BMI- discussed the importance of a healthy diet, water intake and the benefits of aerobic exercise. Educational material provided.   24 hour diet recall: Regular diet  Dental- every 6 months.  Dr. Kennieth Francois.  Sleep patterns- Sleeps 6-7 hours at night.  Wakes feeling rested.  CPAP in use.  Influenza vaccine discussed. He plans to receive later in the season.   Patient Concerns: None at this time. Follow up with PCP as needed.  Exercise Activities and Dietary recommendations Current Exercise Habits: Home exercise routine, Type of exercise: walking, Time (Minutes): > 60(2 hours), Frequency (Times/Week): 7, Weekly Exercise (Minutes/Week): 0, Intensity: Mild  Goals    . Weight 190lb       Fall Risk Fall Risk  12/16/2017 07/11/2017 05/18/2017 05/13/2016 05/05/2015  Falls in the past year? No No No No No  Depression Screen PHQ 2/9 Scores 12/16/2017 05/13/2016 05/05/2015 04/15/2014  PHQ - 2 Score 0 0 0 0    Cognitive Function MMSE - Mini Mental State Exam 12/16/2017 05/05/2015  Orientation to time 5 5  Orientation to Place 5 5  Registration 3 3  Attention/ Calculation 5 5  Recall 3 3  Language- name 2 objects 2 2  Language- repeat 1 1  Language- follow 3 step command 3 3  Language- read & follow direction 1 1  Write a sentence 1 1  Copy design 1 1  Total score 30 30        Immunization History  Administered Date(s) Administered  . Influenza Split 03/03/2013  . Influenza,inj,Quad PF,6+ Mos 01/16/2014  . Influenza-Unspecified 01/25/2012, 02/18/2015, 01/27/2016, 02/08/2017  . Pneumococcal Conjugate-13 06/04/2013  . Pneumococcal Polysaccharide-23 10/25/2008,  05/05/2015  . Tdap 04/26/2009  . Zoster 03/03/2013   Screening Tests Health Maintenance  Topic Date Due  . INFLUENZA VACCINE   11/17/2017  . TETANUS/TDAP  04/27/2019  . COLONOSCOPY  12/26/2024  . Hepatitis C Screening  Completed  . PNA vac Low Risk Adult  Completed      Plan:    End of life planning; Advance aging; Advanced directives discussed. Copy of current HCPOA/Living Will requested.    I have personally reviewed and noted the following in the patient's chart:   . Medical and social history . Use of alcohol, tobacco or illicit drugs  . Current medications and supplements . Functional ability and status . Nutritional status . Physical activity . Advanced directives . List of other physicians . Hospitalizations, surgeries, and ER visits in previous 12 months . Vitals . Screenings to include cognitive, depression, and falls . Referrals and appointments  In addition, I have reviewed and discussed with patient certain preventive protocols, quality metrics, and best practice recommendations. A written personalized care plan for preventive services as well as general preventive health recommendations were provided to patient.     Varney Biles, LPN  1/[id number]/6606

## 2017-12-17 NOTE — Progress Notes (Signed)
I have reviewed the above note and agree.  Alaa Mullally, M.D.  

## 2017-12-18 ENCOUNTER — Other Ambulatory Visit: Payer: Self-pay | Admitting: Family Medicine

## 2017-12-20 ENCOUNTER — Encounter: Payer: Self-pay | Admitting: Family Medicine

## 2017-12-21 ENCOUNTER — Encounter: Payer: Self-pay | Admitting: Family Medicine

## 2017-12-21 ENCOUNTER — Telehealth: Payer: Self-pay | Admitting: Family Medicine

## 2017-12-21 NOTE — Telephone Encounter (Signed)
I spoke with Alexander Fitzgerald & she is going to refax the form. If not received, she is aware that we will fax over a letter.

## 2017-12-21 NOTE — Telephone Encounter (Signed)
Copied from [place] [id number]. Topic: General - Other >> Dec 21, 2017  1:25 PM Lennox Solders wrote: Reason for CRM: marisa from  [facility]  dr boes office is calling to check on the status of medical clearance form. Please refax to [phone]

## 2017-12-23 ENCOUNTER — Other Ambulatory Visit: Payer: Self-pay | Admitting: Family Medicine

## 2017-12-23 NOTE — Telephone Encounter (Signed)
Letter sent but they are wanting paper work that needed to be filled out for surgical clearance along with letter needs to be faxed as well.

## 2017-12-23 NOTE — Telephone Encounter (Signed)
Marcie Bal calling from [place] is calling about the status of the patient Medical Clearance form. Please call [phone] ext [phone]

## 2017-12-23 NOTE — Telephone Encounter (Signed)
I tried to reach the number given. No answer. I have refaxed ALL paperwork that was requested which I also faxed yesterday. The clearance form they sent [place] was the very first page I faxed back to them (after the cover page). I also included last EKG, last BMP, medication list, & last OV note. Note sure what more they are asking for at this point.

## 2018-01-09 DIAGNOSIS — S83231A Complex tear of medial meniscus, current injury, right knee, initial encounter: Secondary | ICD-10-CM | POA: Diagnosis not present

## 2018-01-09 DIAGNOSIS — S83281A Other tear of lateral meniscus, current injury, right knee, initial encounter: Secondary | ICD-10-CM | POA: Diagnosis not present

## 2018-01-09 DIAGNOSIS — S83241A Other tear of medial meniscus, current injury, right knee, initial encounter: Secondary | ICD-10-CM | POA: Diagnosis not present

## 2018-01-13 ENCOUNTER — Telehealth: Payer: Self-pay

## 2018-01-13 MED ORDER — OXYMETAZOLINE HCL 0.05 % NA SOLN: 1.0000 | Freq: Two times a day (BID) | NASAL | 2 refills | Status: DC

## 2018-01-13 MED ORDER — ALBUTEROL SULFATE HFA 108 (90 BASE) MCG/ACT IN AERS: INHALATION_SPRAY | RESPIRATORY_TRACT | 2 refills | Status: DC

## 2018-01-13 NOTE — Telephone Encounter (Signed)
Copied from [place] [phone]. Topic: Quick Communication - Rx Refill/Question >> Jan 13, 2018  2:24 PM Percell Belt A wrote: Medication: PROAIR HFA 108 (90 Base) MCG/ACT inhaler [[id number]]  oxymetazoline (AFRIN) 0.05 % nasal spray [[id number]  Has the patient contacted their pharmacy? Yes  (Agent: If no, request that the patient contact the pharmacy for the refill.) (Agent: If yes, when and what did the pharmacy advise?)  Preferred Pharmacy (with phone number or street name): [id number] [address], [place] [phone] (Phone)   Agent: Please be advised that RX refills may take up to 3 business days. We ask that you follow-up with your pharmacy.

## 2018-01-13 NOTE — Addendum Note (Signed)
Addended by: Myriam Forehand on: 01/13/2018 03:51 PM   Modules accepted: Orders

## 2018-01-15 ENCOUNTER — Other Ambulatory Visit: Payer: Self-pay | Admitting: Family Medicine

## 2018-01-24 ENCOUNTER — Other Ambulatory Visit: Payer: Self-pay | Admitting: Family Medicine

## 2018-02-13 ENCOUNTER — Ambulatory Visit (INDEPENDENT_AMBULATORY_CARE_PROVIDER_SITE_OTHER): Payer: Medicare HMO

## 2018-02-13 DIAGNOSIS — Z23 Encounter for immunization: Secondary | ICD-10-CM | POA: Diagnosis not present

## 2018-02-14 ENCOUNTER — Other Ambulatory Visit: Payer: Self-pay | Admitting: Family Medicine

## 2018-03-07 DIAGNOSIS — G4733 Obstructive sleep apnea (adult) (pediatric): Secondary | ICD-10-CM | POA: Diagnosis not present

## 2018-05-18 ENCOUNTER — Other Ambulatory Visit: Payer: Self-pay | Admitting: Family Medicine

## 2018-05-19 ENCOUNTER — Encounter: Payer: Self-pay | Admitting: Family Medicine

## 2018-05-19 ENCOUNTER — Ambulatory Visit (INDEPENDENT_AMBULATORY_CARE_PROVIDER_SITE_OTHER): Payer: Medicare HMO | Admitting: Family Medicine

## 2018-05-19 VITALS — BP 110/78 | HR 80 | Temp 98.2°F | Resp 18 | Ht 70.0 in | Wt 212.2 lb

## 2018-05-19 DIAGNOSIS — Z0000 Encounter for general adult medical examination without abnormal findings: Secondary | ICD-10-CM

## 2018-05-19 DIAGNOSIS — E890 Postprocedural hypothyroidism: Secondary | ICD-10-CM | POA: Diagnosis not present

## 2018-05-19 DIAGNOSIS — E7800 Pure hypercholesterolemia, unspecified: Secondary | ICD-10-CM | POA: Diagnosis not present

## 2018-05-19 DIAGNOSIS — I10 Essential (primary) hypertension: Secondary | ICD-10-CM

## 2018-05-19 DIAGNOSIS — E669 Obesity, unspecified: Secondary | ICD-10-CM | POA: Diagnosis not present

## 2018-05-19 DIAGNOSIS — Z125 Encounter for screening for malignant neoplasm of prostate: Secondary | ICD-10-CM

## 2018-05-19 LAB — COMPREHENSIVE METABOLIC PANEL
ALBUMIN: 4.4 g/dL (ref 3.5–5.2)
ALT: 22 U/L (ref 0–53)
AST: 19 U/L (ref 0–37)
Alkaline Phosphatase: 60 U/L (ref 39–117)
BUN: 22 mg/dL (ref 6–23)
CALCIUM: 10 mg/dL (ref 8.4–10.5)
CHLORIDE: 100 meq/L (ref 96–112)
CO2: 28 meq/L (ref 19–32)
Creatinine, Ser: 1.18 mg/dL (ref 0.40–1.50)
GFR: 60.72 mL/min (ref >60.00)
Glucose, Bld: 105 mg/dL — ABNORMAL HIGH (ref 70–99)
Potassium: 4.4 mEq/L (ref 3.5–5.1)
Sodium: 136 mEq/L (ref 135–145)
Total Bilirubin: 1.1 mg/dL (ref 0.2–1.2)
Total Protein: 6.7 g/dL (ref 6.0–8.3)

## 2018-05-19 LAB — LIPID PANEL
CHOLESTEROL: 185 mg/dL (ref 0–200)
HDL: 42.2 mg/dL (ref >39.00)
LDL CALC: 111 mg/dL — AB (ref 0–99)
NonHDL: 142.61
TRIGLYCERIDES: 160 mg/dL — AB (ref 0.0–149.0)
Total CHOL/HDL Ratio: 4
VLDL: 32 mg/dL (ref 0.0–40.0)

## 2018-05-19 LAB — TSH: TSH: 0.4 u[IU]/mL (ref 0.35–4.50)

## 2018-05-19 LAB — HEMOGLOBIN A1C: Hgb A1c MFr Bld: 6 % (ref 4.6–6.5)

## 2018-05-19 LAB — PSA, MEDICARE: PSA: 0.26 ng/mL (ref 0.10–4.00)

## 2018-05-19 NOTE — Progress Notes (Signed)
[name], MD Phone: [phone]  Alexander Fitzgerald is a 73 y.o. male who presents today for CPE.  Exercise: Walks 3 miles daily. Diet: Much better than previously.  He is trying to cut down on portion sizes.  He pretty much follows weight watchers. Colonoscopy up-to-date 12/27/2014.  5-year recall. Due for prostate cancer screening. Up-to-date on pneumonia vaccine, flu vaccine, and tetanus vaccine.  He is gotten the first dose of Shingrix.  He is due to get the second vaccine from his pharmacy. Hepatitis C screening up-to-date. No family history of prostate cancer or colon cancer. He quit smoking in 2001.  He smoked off and on for 20 years typically less than a pack per day. Rare alcohol use.  No illicit drug use. He sees a Pharmacist, community twice yearly.  He has follow-up with optometry soon.  Active Ambulatory Problems    Diagnosis Date Noted  . Hypertension 01/13/2011  . RBBB (right bundle branch block) 04/23/2011  . OSA on CPAP 04/15/2014  . Obesity (BMI 30-39.9) 04/15/2014  . Arthritis of left knee 08/13/2015  . Rosacea 11/27/2015  . GERD (gastroesophageal reflux disease) 11/27/2015  . Pure hypercholesterolemia 02/26/2016  . BPH (benign prostatic hyperplasia) 05/13/2016  . Routine general medical examination at a health care facility 05/13/2016  . Asthma 06/10/2016  . 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 Venous insufficiency 05/18/2017  . Degenerative arthritis of right knee 06/20/2017  . Preop examination 12/13/2017  . Ankle sprain 12/13/2017   Resolved Ambulatory Problems    Diagnosis Date Noted  . SOB (shortness of breath) 04/23/2011  . Pain of left great toe 09/29/2012  . Medicare annual wellness visit, subsequent 12/04/2012  . Asthma with acute exacerbation 12/04/2012  . Esophageal spasm 05/04/2013  . Cough 06/04/2013  . Dyspnea and respiratory abnormality 08/22/2013  . Gout 05/22/2015  . Acute medial meniscal  tear 08/13/2015  . Abnormal TSH 11/27/2015  . Neck fullness 05/13/2016  . Acute bronchitis 06/10/2016  . URI (upper respiratory infection) 07/13/2016  . Benign skin cyst 09/16/2016   Past Medical History:  Diagnosis Date  . Asthma   . Cancer ([place])   . Depression   . Esophageal reflux   . Gout   . Hiatal hernia     Family History  Problem Relation Age of Onset  . Hypertension Mother   . Hyperlipidemia Mother   . Diabetes Mother   . Heart attack Father 57       MI  . Hypertension Father   . Cancer Sister        pancreatic    Social History   Socioeconomic History  . Marital status: Married    Spouse name: Not on file  . Number of children: Not on file  . Years of education: Not on file  . Highest education level: Not on file  Occupational History  . Not on file  Social Needs  . Financial resource strain: Not hard at all  . Food insecurity:    Worry: Never true    Inability: Never true  . Transportation needs:    Medical: No    Non-medical: No  Tobacco Use  . Smoking status: Former Smoker    Packs/day: 0.50    Years: 20.00    Pack years: 10.00    Types: Cigarettes    Last attempt to quit: 06/15/1999    Years since quitting: 18.9  . Smokeless tobacco: Never Used  Substance and Sexual Activity  . Alcohol  use: Yes    Alcohol/week: 1.0 standard drinks    Types: 1 Standard drinks or equivalent per week    Comment: rarely  . Drug use: No  . Sexual activity: Yes  Lifestyle  . Physical activity:    Days per week: Not on file    Minutes per session: Not on file  . Stress: Not on file  Relationships  . Social connections:    Talks on phone: Not on file    Gets together: Not on file    Attends religious service: Not on file    Active member of club or organization: Not on file    Attends meetings of clubs or organizations: Not on file    Relationship status: Not on file  . Intimate partner violence:    Fear of current or ex partner: No    Emotionally  abused: No    Physically abused: No    Forced sexual activity: No  Other Topics Concern  . Not on file  Social History Narrative  . Not on file    ROS  General:  Negative for nexplained weight loss, fever Skin: Negative for new or changing mole, sore that won't heal HEENT: Negative for trouble hearing, trouble seeing, ringing in ears, mouth sores, hoarseness, change in voice, dysphagia. CV:  Negative for chest pain, dyspnea, edema, palpitations Resp: Negative for cough, dyspnea, hemoptysis GI: Negative for nausea, vomiting, diarrhea, constipation, abdominal pain, melena, hematochezia. GU: Negative for dysuria, incontinence, urinary hesitance, hematuria, vaginal or penile discharge, polyuria, sexual difficulty, lumps in testicle or breasts MSK: Negative for muscle cramps or aches, joint pain or swelling Neuro: Negative for headaches, weakness, numbness, dizziness, passing out/fainting Psych: Negative for depression, anxiety, memory problems  Objective  Physical Exam Vitals:   05/19/18 1036  BP: 110/78  Pulse: 80  Resp: 18  Temp: 98.2 F (36.8 C)  SpO2: 94%    BP Readings from Last 3 Encounters:  05/19/18 110/78  12/16/17 124/72  12/13/17 120/80   Wt Readings from Last 3 Encounters:  05/19/18 212 lb 4 oz (96.3 kg)  12/16/17 213 lb 6.4 oz (96.8 kg)  12/13/17 214 lb 9.6 oz (97.3 kg)    Physical Exam Constitutional:      General: He is not in acute distress.    Appearance: He is not diaphoretic.  HENT:     Head: Normocephalic and atraumatic.     Mouth/Throat:     Mouth: Mucous membranes are moist.     Pharynx: Oropharynx is clear.  Eyes:     Conjunctiva/sclera: Conjunctivae normal.     Pupils: Pupils are equal, round, and reactive to light.  Cardiovascular:     Rate and Rhythm: Normal rate and regular rhythm.     Heart sounds: Normal heart sounds.  Pulmonary:     Effort: Pulmonary effort is normal.     Breath sounds: Normal breath sounds.  Abdominal:      General: Bowel sounds are normal. There is no distension.     Palpations: Abdomen is soft.     Tenderness: There is no abdominal tenderness. There is no guarding or rebound.  Musculoskeletal:     Comments: Compression stockings in place  Lymphadenopathy:     Cervical: No cervical adenopathy.  Skin:    General: Skin is warm and dry.  Neurological:     Mental Status: He is alert.  Psychiatric:        Mood and Affect: Mood normal.      Assessment/Plan:  Routine general medical examination at a health care facility Physical exam completed.  He will continue diet and exercise.  He will look into getting his second Shingrix dose from a different pharmacy.  Lab work as outlined below.   Orders Placed This Encounter  Procedures  . HgB A1c  . Comp Met (CMET)  . TSH  . Lipid panel  . PSA, Medicare ( [facility] only)    No orders of the defined types were placed in this encounter.    [name], MD [place]

## 2018-05-19 NOTE — Assessment & Plan Note (Signed)
Physical exam completed.  He will continue diet and exercise.  He will look into getting his second Shingrix dose from a different pharmacy.  Lab work as outlined below.

## 2018-05-19 NOTE — Patient Instructions (Signed)
Nice to see you. Please continue with diet and exercise. We will call you with your lab results. I hope you enjoy your trip to [place].

## 2018-05-25 ENCOUNTER — Encounter: Payer: Self-pay | Admitting: *Deleted

## 2018-05-25 DIAGNOSIS — E785 Hyperlipidemia, unspecified: Secondary | ICD-10-CM

## 2018-05-29 MED ORDER — ROSUVASTATIN CALCIUM 20 MG PO TABS: 20.0000 mg | ORAL_TABLET | Freq: Every day | ORAL | 3 refills | Status: DC

## 2018-06-07 DIAGNOSIS — G4733 Obstructive sleep apnea (adult) (pediatric): Secondary | ICD-10-CM | POA: Diagnosis not present

## 2018-06-08 DIAGNOSIS — H524 Presbyopia: Secondary | ICD-10-CM | POA: Diagnosis not present

## 2018-06-08 DIAGNOSIS — Z0100 Encounter for examination of eyes and vision without abnormal findings: Secondary | ICD-10-CM | POA: Diagnosis not present

## 2018-06-25 ENCOUNTER — Other Ambulatory Visit: Payer: Self-pay | Admitting: Family Medicine

## 2018-07-04 ENCOUNTER — Telehealth: Payer: Self-pay

## 2018-07-04 NOTE — Telephone Encounter (Signed)
Copied from [place] [id number]. Topic: Quick Communication - Rx Refill/Question >> Jul 04, 2018  2:44 PM Carolyn Stare wrote: Medication lisinopril (PRINIVIL,ZESTRIL) 5 MG tablet  Has the patient contacted their pharmacy yes    [facility]  [address]   Agent: Please be advised that RX refills may take up to 3 business days. We ask that you follow-up with your pharmacy.

## 2018-07-05 MED ORDER — LISINOPRIL 5 MG PO TABS: 5.0000 mg | ORAL_TABLET | Freq: Every day | ORAL | 1 refills | Status: DC

## 2018-07-05 NOTE — Telephone Encounter (Signed)
Last OV 05/20/2019  Last refilled   Next appt

## 2018-07-05 NOTE — Addendum Note (Signed)
Addended by: Myriam Forehand on: 07/05/2018 10:01 AM   Modules accepted: Orders

## 2018-07-05 NOTE — Telephone Encounter (Signed)
Rx sent 

## 2018-07-05 NOTE — Telephone Encounter (Signed)
Pt advised Rx has been sent

## 2018-07-28 ENCOUNTER — Other Ambulatory Visit: Payer: Self-pay | Admitting: Family Medicine

## 2018-08-02 ENCOUNTER — Other Ambulatory Visit: Payer: Self-pay | Admitting: Family Medicine

## 2018-08-04 ENCOUNTER — Telehealth: Payer: Self-pay | Admitting: Cardiovascular Disease

## 2018-08-04 NOTE — Telephone Encounter (Signed)
Consent pending mychart response

## 2018-08-08 ENCOUNTER — Telehealth: Payer: Self-pay

## 2018-08-08 NOTE — Telephone Encounter (Signed)
Called patient to confirm consent.  No answer. LMOV.

## 2018-08-08 NOTE — Progress Notes (Signed)
Virtual Visit via Video Note   This visit type was conducted due to national recommendations for restrictions regarding the COVID-19 Pandemic (e.g. social distancing) in an effort to limit this patient's exposure and mitigate transmission in our community.  Due to his co-morbid illnesses, this patient is at least at moderate risk for complications without adequate follow up.  This format is felt to be most appropriate for this patient at this time.  All issues noted in this document were discussed and addressed.  A limited physical exam was performed with this format.  Please refer to the patient's chart for his consent to telehealth for [facility].   I connected with  Verta Ellen on 08/09/18 by a video enabled telemedicine application and verified that I am speaking with the correct person using two identifiers. I discussed the limitations of evaluation and management by telemedicine. The patient expressed understanding and agreed to proceed.   Evaluation Performed:  Follow-up visit  Date:  08/09/2018   ID:  Alexander Fitzgerald, DOB 02-22-1946, MRN [id number]  Patient Location:  [place] [address]   Provider location:   [facility], [place] office  PCP:  Leone Haven, MD  Cardiologist:  Arvid Right [facility]   Chief Complaint:   Hypertension, knee pain   History of Present Illness:    Alexander Fitzgerald is a 73 y.o. male who presents via audio/video conferencing for a telehealth visit today.   The patient does not symptoms concerning for COVID-19 infection (fever, chills, cough, or new SHORTNESS OF BREATH).   Patient has a past medical history of 20 year smoking history,  mild asthma exacerbated by upper respiratory infections,  hypertension,   previously seen by cardiology in 2014 for shortness of breath,  Coronary calcium score of 106. LAD prox and mid calcium who presents  for discussion of his elevated CT coronary calcium  score and discussion of his cardiac risk factors.  Thyroid nodules Thyroidectomy In recovery, had high blood pressure:  Restarted lisinopril Saw [facility] Added torsemide 5 mg daily  Active , walks 2 to 3 miles  120/80 or lower Pulse 80 resp 16  Poor diet LDL 86 up to 111 Weight down 19 pounds Changed diet  Labs reviewed HBA1C 6.0  Weight today 192.3, down from 214 pounds  Knee arthritis Had cortisone Torn miniscus   Other past medical history reviewed S/p para-esophageal hernia surgery in [place] mesh placed, long recovery  OSA, on CPAP, sleeps well   Lab work discussed with him Total chol 145, LDL 77, in May 2017 On no statin Previous total cholesterol 170 up to 180, LDL 100     Prior CV studies:   The following studies were reviewed today:    Past Medical History:  Diagnosis Date  . Asthma    adult onset, PFTs done in [place]  . Cancer ([place])    Several moles removed in past, all pre-cancerous.  Recently followed by Dr. Nehemiah Massed  . Depression    Treated with Prozac in 1992,  Associate with job loss  . Esophageal reflux   . Gout   . Hiatal hernia   . Hypertension    Past Surgical History:  Procedure Laterality Date  . APPENDECTOMY    . COLONOSCOPY N/A 12/27/2014   Procedure: COLONOSCOPY;  Surgeon: Lollie Sails, MD;  Location[facility] ENDOSCOPY;  Service: Endoscopy;  Laterality: N/A;  . LAPAROSCOPIC PARAESOPHAGEAL HERNIA REPAIR    .  TONSILLECTOMY       Current Meds  Medication Sig  . ADVAIR DISKUS 250-50 MCG/DOSE AEPB TAKE 1 PUFF BY MOUTH AS NEEDED  . albuterol (PROAIR HFA) 108 (90 Base) MCG/ACT inhaler TAKE 2 PUFFS 4 TIMES A DAY AS NEEDED  . Ascorbic Acid (VITAMIN C) 1000 MG tablet Take 1,000 mg by mouth daily. Reported on 06/26/2015  . aspirin 81 MG chewable tablet Chew by mouth daily.  . Cholecalciferol (VITAMIN D3) 2000 units capsule Take 4,000 Units by mouth.  . Cyanocobalamin (VITAMIN B-12) 5000 MCG SUBL Place 2 each under  the tongue daily.    . Dapsone (ACZONE) 5 % topical gel Aczone 5 % topical gel  APPLY A SMALL AMOUNT TO AFFECTED AREA EVERY MORNING  . docusate sodium (COLACE) 100 MG capsule Take 100 mg by mouth daily.  Marland Kitchen doxycycline (VIBRAMYCIN) 50 MG capsule Take 50 mg by mouth daily.  . finasteride (PROSCAR) 5 MG tablet TAKE 1 TABLET BY MOUTH EVERY DAY  . levothyroxine (SYNTHROID, LEVOTHROID) 112 MCG tablet Take 1 tablet (112 mcg total) by mouth daily.  Marland Kitchen lisinopril (PRINIVIL,ZESTRIL) 5 MG tablet Take 1 tablet (5 mg total) by mouth daily.  . meclofenamate (MECLOMEN) 100 MG capsule Take 100 mg by mouth every 6 (six) hours.  . metroNIDAZOLE (METROGEL) 1 % gel metronidazole 1 % topical gel  APPLY TO AFFECTED AREA EVERY DAY  . montelukast (SINGULAIR) 10 MG tablet TAKE 1 TABLET BY MOUTH EVERYDAY AT BEDTIME  . Multiple Vitamin (MULTIVITAMIN) capsule Take 1 capsule by mouth daily.    Marland Kitchen oxymetazoline (AFRIN) 0.05 % nasal spray Place 1 spray into both nostrils 2 (two) times daily.  . pantoprazole (PROTONIX) 40 MG tablet TAKE 1 TABLET BY MOUTH EVERY DAY BEFORE A MEAL AS DIRECTED  . Pyridoxine HCl (VITAMIN B-6 CR) 200 MG TBCR Take by mouth.    . torsemide (DEMADEX) 5 MG tablet Take 5 mg by mouth daily.     Allergies:   Sulfa drugs cross reactors   Social History   Tobacco Use  . Smoking status: Former Smoker    Packs/day: 0.50    Years: 20.00    Pack years: 10.00    Types: Cigarettes    Last attempt to quit: 06/15/1999    Years since quitting: 19.1  . Smokeless tobacco: Never Used  Substance Use Topics  . Alcohol use: Yes    Alcohol/week: 1.0 standard drinks    Types: 1 Standard drinks or equivalent per week    Comment: rarely  . Drug use: No     Current Outpatient Medications on File Prior to Visit  Medication Sig Dispense Refill  . ADVAIR DISKUS 250-50 MCG/DOSE AEPB TAKE 1 PUFF BY MOUTH AS NEEDED 60 each 3  . albuterol (PROAIR HFA) 108 (90 Base) MCG/ACT inhaler TAKE 2 PUFFS 4 TIMES A DAY AS  NEEDED 8.5 Inhaler 2  . Ascorbic Acid (VITAMIN C) 1000 MG tablet Take 1,000 mg by mouth daily. Reported on 06/26/2015    . aspirin 81 MG chewable tablet Chew by mouth daily.    . Cholecalciferol (VITAMIN D3) 2000 units capsule Take 4,000 Units by mouth.    . Cyanocobalamin (VITAMIN B-12) 5000 MCG SUBL Place 2 each under the tongue daily.      . Dapsone (ACZONE) 5 % topical gel Aczone 5 % topical gel  APPLY A SMALL AMOUNT TO AFFECTED AREA EVERY MORNING    . docusate sodium (COLACE) 100 MG capsule Take 100 mg by mouth daily.    Marland Kitchen  doxycycline (VIBRAMYCIN) 50 MG capsule Take 50 mg by mouth daily.    . finasteride (PROSCAR) 5 MG tablet TAKE 1 TABLET BY MOUTH EVERY DAY 30 tablet 1  . levothyroxine (SYNTHROID, LEVOTHROID) 112 MCG tablet Take 1 tablet (112 mcg total) by mouth daily. 90 tablet 0  . lisinopril (PRINIVIL,ZESTRIL) 5 MG tablet Take 1 tablet (5 mg total) by mouth daily. 90 tablet 1  . meclofenamate (MECLOMEN) 100 MG capsule Take 100 mg by mouth every 6 (six) hours.    . metroNIDAZOLE (METROGEL) 1 % gel metronidazole 1 % topical gel  APPLY TO AFFECTED AREA EVERY DAY    . montelukast (SINGULAIR) 10 MG tablet TAKE 1 TABLET BY MOUTH EVERYDAY AT BEDTIME 90 tablet 1  . Multiple Vitamin (MULTIVITAMIN) capsule Take 1 capsule by mouth daily.      Marland Kitchen oxymetazoline (AFRIN) 0.05 % nasal spray Place 1 spray into both nostrils 2 (two) times daily. 30 mL 2  . pantoprazole (PROTONIX) 40 MG tablet TAKE 1 TABLET BY MOUTH EVERY DAY BEFORE A MEAL AS DIRECTED 90 tablet 3  . Pyridoxine HCl (VITAMIN B-6 CR) 200 MG TBCR Take by mouth.      . torsemide (DEMADEX) 5 MG tablet Take 5 mg by mouth daily.    . rosuvastatin (CRESTOR) 20 MG tablet Take 1 tablet (20 mg total) by mouth daily. (Patient not taking: Reported on 08/09/2018) 90 tablet 3   No current facility-administered medications on file prior to visit.      Family Hx: The patient's family history includes Cancer in his sister; Diabetes in his mother; Heart  attack (age of onset: 50) in his father; Hyperlipidemia in his mother; Hypertension in his father and mother.  ROS:   Please see the history of present illness.    Review of Systems  Constitutional: Negative.   Respiratory: Negative.   Cardiovascular: Negative.   Gastrointestinal: Negative.   Musculoskeletal: Negative.   Neurological: Negative.   Psychiatric/Behavioral: Negative.   All other systems reviewed and are negative.     Labs/Other Tests and Data Reviewed:    Recent Labs: 05/19/2018: ALT 22; BUN 22; Creatinine, Ser 1.18; Potassium 4.4; Sodium 136; TSH 0.40   Recent Lipid Panel Lab Results  Component Value Date/Time   CHOL 185 05/19/2018 11:05 AM   CHOL 142 03/01/2016 09:29 AM   TRIG 160.0 (H) 05/19/2018 11:05 AM   HDL 42.20 05/19/2018 11:05 AM   HDL 49 03/01/2016 09:29 AM   CHOLHDL 4 05/19/2018 11:05 AM   LDLCALC 111 (H) 05/19/2018 11:05 AM   LDLCALC 70 03/01/2016 09:29 AM    Wt Readings from Last 3 Encounters:  05/19/18 212 lb 4 oz (96.3 kg)  12/16/17 213 lb 6.4 oz (96.8 kg)  12/13/17 214 lb 9.6 oz (97.3 kg)     Exam:    Vital Signs: Vital signs may also be detailed in the HPI There were no vitals taken for this visit.  Wt Readings from Last 3 Encounters:  05/19/18 212 lb 4 oz (96.3 kg)  12/16/17 213 lb 6.4 oz (96.8 kg)  12/13/17 214 lb 9.6 oz (97.3 kg)   Temp Readings from Last 3 Encounters:  05/19/18 98.2 F (36.8 C) (Oral)  12/16/17 98.5 F (36.9 C) (Oral)  12/13/17 98.5 F (36.9 C) (Oral)   BP Readings from Last 3 Encounters:  05/19/18 110/78  12/16/17 124/72  12/13/17 120/80   Pulse Readings from Last 3 Encounters:  05/19/18 80  12/16/17 85  12/13/17 85  Well nourished, well developed male in no acute distress. Constitutional:  oriented to person, place, and time. No distress.  Head: Normocephalic and atraumatic.  Eyes:  no discharge. No scleral icterus.  Neck: Normal range of motion. Neck supple.  Pulmonary/Chest: No audible  wheezing, no distress, appears comfortable Musculoskeletal: Normal range of motion.  no  tenderness or deformity.  Neurological:   Coordination normal. Full exam not performed Skin:  No rash Psychiatric:  normal mood and affect. behavior is normal. Thought content normal.    ASSESSMENT & PLAN:    Essential hypertension Radical weight loss close to 20 pounds in the past several months Blood pressure has been stable on lisinopril 5 and torsemide 5 Recommend he check orthostatics at home If significant drop in pressure may need to take torsemide every other day  Pure hypercholesterolemia Was concerned about LDL 111 Does not want a statin We will take this off his list.  Complemented him on recent 20 pound weight loss No further work-up at this time  Venous insufficiency Not a major issue, can wear compression hose as needed  Smoker We have encouraged him to continue to work on weaning his cigarettes and smoking cessation. He will continue to work on this and does not want any assistance with chantix.  By his account has stopped smoking  Coronary artery calcification seen on CAT scan Low score 3 years ago No further testing needed  COVID-19 Education: The signs and symptoms of COVID-19 were discussed with the patient and how to seek care for testing (follow up with PCP or arrange E-visit).  The importance of social distancing was discussed today.  Patient Risk:   After full review of this patients clinical status, I feel that they are at least moderate risk at this time.  Time:   Today, I have spent 25 minutes with the patient with telehealth technology discussing the cardiac and medical problems/diagnoses detailed above   10 min spent reviewing the chart prior to patient visit today   Medication Adjustments/Labs and Tests Ordered: Current medicines are reviewed at length with the patient today.  Concerns regarding medicines are outlined above.   Tests Ordered: No tests  ordered   Medication Changes: No changes made   Disposition: Follow-up as needed   Signed, Ida Rogue, MD  08/09/2018 2:29 PM    [facility] [address] [place] [address], [place], [place] [address]

## 2018-08-08 NOTE — Telephone Encounter (Signed)
Patient returned my call.  Obtained verbal consent from patient.       Virtual Visit Pre-Appointment Phone Call  "(Name), I am calling you today to discuss your upcoming appointment. We are currently trying to limit exposure to the virus that causes COVID-19 by seeing patients at home rather than in the office."  1. "What is the BEST phone number to call the day of the visit?" - include this in appointment notes  2. "Do you have or have access to (through a family member/friend) a smartphone with video capability that we can use for your visit?" a. If yes - list this number in appt notes as "cell" (if different from BEST phone #) and list the appointment type as a VIDEO visit in appointment notes b. If no - list the appointment type as a PHONE visit in appointment notes  3. Confirm consent - "In the setting of the current Covid19 crisis, you are scheduled for a (phone or video) visit with your provider on (date) at (time).  Just as we do with many in-office visits, in order for you to participate in this visit, we must obtain consent.  If you'd like, I can send this to your mychart (if signed up) or email for you to review.  Otherwise, I can obtain your verbal consent now.  All virtual visits are billed to your insurance company just like a normal visit would be.  By agreeing to a virtual visit, we'd like you to understand that the technology does not allow for your provider to perform an examination, and thus may limit your provider's ability to fully assess your condition. If your provider identifies any concerns that need to be evaluated in person, we will make arrangements to do so.  Finally, though the technology is pretty good, we cannot assure that it will always work on either your or our end, and in the setting of a video visit, we may have to convert it to a phone-only visit.  In either situation, we cannot ensure that we have a secure connection.  Are you willing to proceed?" STAFF: Did  the patient verbally acknowledge consent to telehealth visit? Document YES/NO here: YES  4. Advise patient to be prepared - "Two hours prior to your appointment, go ahead and check your blood pressure, pulse, oxygen saturation, and your weight (if you have the equipment to check those) and write them all down. When your visit starts, your provider will ask you for this information. If you have an Apple Watch or Kardia device, please plan to have heart rate information ready on the day of your appointment. Please have a pen and paper handy nearby the day of the visit as well."  5. Give patient instructions for MyChart download to smartphone OR [email or web address] as below if video visit (depending on what platform provider is using)  6. Inform patient they will receive a phone call 15 minutes prior to their appointment time (may be from unknown caller ID) so they should be prepared to answer    TELEPHONE CALL NOTE  DEGAN HANSER has been deemed a candidate for a follow-up tele-health visit to limit community exposure during the Covid-19 pandemic. I spoke with the patient via phone to ensure availability of phone/video source, confirm preferred email & phone number, and discuss instructions and expectations.  I reminded JEMELL TOWN to be prepared with any vital sign and/or heart rhythm information that could potentially be obtained via home monitoring, at the time of  his visit. I reminded JERELL DEMERY to expect a phone call prior to his visit.  Janan Ridge, [place] 08/08/2018 11:47 AM   INSTRUCTIONS FOR DOWNLOADING THE MYCHART APP TO SMARTPHONE  - The patient must first make sure to have activated MyChart and know their login information - If Apple, go to [employer] and type in MyChart in the search bar and download the app. If Android, ask patient to go to [employer] and type in [place] in the search bar and download the app. The app is free but as with any other app  downloads, their phone may require them to verify saved payment information or Apple/Android password.  - The patient will need to then log into the app with their MyChart username and password, and select [facility] as their healthcare provider to link the account. When it is time for your visit, go to the MyChart app, find appointments, and click Begin Video Visit. Be sure to Select Allow for your device to access the Microphone and Camera for your visit. You will then be connected, and your provider will be with you shortly.  **If they have any issues connecting, or need assistance please contact MyChart service desk [phone])**  **If using a computer, in order to ensure the best quality for their visit they will need to use either of the following Internet Browsers: [employer], or Google Chrome**  IF USING DOXIMITY or DOXY.ME - The patient will receive a link just prior to their visit by text.     FULL LENGTH CONSENT FOR TELE-HEALTH VISIT   I hereby voluntarily request, consent and authorize [facility] and its employed or contracted physicians, physician assistants, nurse practitioners or other licensed health care professionals (the Practitioner), to provide me with telemedicine health care services (the "Services") as deemed necessary by the treating Practitioner. I acknowledge and consent to receive the Services by the Practitioner via telemedicine. I understand that the telemedicine visit will involve communicating with the Practitioner through live audiovisual communication technology and the disclosure of certain medical information by electronic transmission. I acknowledge that I have been given the opportunity to request an in-person assessment or other available alternative prior to the telemedicine visit and am voluntarily participating in the telemedicine visit.  I understand that I have the right to withhold or withdraw my consent to the use of telemedicine  in the course of my care at any time, without affecting my right to future care or treatment, and that the Practitioner or I may terminate the telemedicine visit at any time. I understand that I have the right to inspect all information obtained and/or recorded in the course of the telemedicine visit and may receive copies of available information for a reasonable fee.  I understand that some of the potential risks of receiving the Services via telemedicine include:  Marland Kitchen Delay or interruption in medical evaluation due to technological equipment failure or disruption; . Information transmitted may not be sufficient (e.g. poor resolution of images) to allow for appropriate medical decision making by the Practitioner; and/or  . In rare instances, security protocols could fail, causing a breach of personal health information.  Furthermore, I acknowledge that it is my responsibility to provide information about my medical history, conditions and care that is complete and accurate to the best of my ability. I acknowledge that Practitioner's advice, recommendations, and/or decision may be based on factors not within their control, such as incomplete or inaccurate data provided by  me or distortions of diagnostic images or specimens that may result from electronic transmissions. I understand that the practice of medicine is not an exact science and that Practitioner makes no warranties or guarantees regarding treatment outcomes. I acknowledge that I will receive a copy of this consent concurrently upon execution via email to the email address I last provided but may also request a printed copy by calling the office of [facility].    I understand that my insurance will be billed for this visit.   I have read or had this consent read to me. . I understand the contents of this consent, which adequately explains the benefits and risks of the Services being provided via telemedicine.  . I have been provided ample  opportunity to ask questions regarding this consent and the Services and have had my questions answered to my satisfaction. . I give my informed consent for the services to be provided through the use of telemedicine in my medical care  By participating in this telemedicine visit I agree to the above.

## 2018-08-09 ENCOUNTER — Telehealth (INDEPENDENT_AMBULATORY_CARE_PROVIDER_SITE_OTHER): Payer: Medicare HMO | Admitting: Cardiovascular Disease

## 2018-08-09 ENCOUNTER — Other Ambulatory Visit: Payer: Self-pay

## 2018-08-09 DIAGNOSIS — I10 Essential (primary) hypertension: Secondary | ICD-10-CM

## 2018-08-09 DIAGNOSIS — G4733 Obstructive sleep apnea (adult) (pediatric): Secondary | ICD-10-CM | POA: Diagnosis not present

## 2018-08-09 DIAGNOSIS — F17200 Nicotine dependence, unspecified, uncomplicated: Secondary | ICD-10-CM | POA: Insufficient documentation

## 2018-08-09 DIAGNOSIS — K219 Gastro-esophageal reflux disease without esophagitis: Secondary | ICD-10-CM

## 2018-08-09 DIAGNOSIS — Z9989 Dependence on other enabling machines and devices: Secondary | ICD-10-CM | POA: Diagnosis not present

## 2018-08-09 DIAGNOSIS — I872 Venous insufficiency (chronic) (peripheral): Secondary | ICD-10-CM | POA: Diagnosis not present

## 2018-08-09 DIAGNOSIS — I2510 Atherosclerotic heart disease of native coronary artery without angina pectoris: Secondary | ICD-10-CM | POA: Diagnosis not present

## 2018-08-09 DIAGNOSIS — E7800 Pure hypercholesterolemia, unspecified: Secondary | ICD-10-CM

## 2018-08-09 DIAGNOSIS — E042 Nontoxic multinodular goiter: Secondary | ICD-10-CM | POA: Diagnosis not present

## 2018-08-09 NOTE — Patient Instructions (Signed)
Medication Instructions:  Stop crestor   If you need a refill on your cardiac medications before your next appointment, please call your pharmacy.    Lab work: No new labs needed   If you have labs (blood work) drawn today and your tests are completely normal, you will receive your results only by: Marland Kitchen MyChart Message (if you have MyChart) OR . A paper copy in the mail If you have any lab test that is abnormal or we need to change your treatment, we will call you to review the results.   Testing/Procedures: No new testing needed   Follow-Up: At [facility], you and your health needs are our priority.  As part of our continuing mission to provide you with exceptional heart care, we have created designated Provider Care Teams.  These Care Teams include your primary Cardiologist (physician) and Advanced Practice Providers (APPs -  Physician Assistants and Nurse Practitioners) who all work together to provide you with the care you need, when you need it.  . You will need a follow up appointment as needed  . Providers on your designated Care Team:   . Murray Hodgkins, NP . Christell Faith, PA-C . Marrianne Mood, PA-C  Any Other Special Instructions Will Be Listed Below (If Applicable).  For educational health videos Log in to : [email or web address] Or : [email or web address], password : [email or web address]

## 2018-08-28 ENCOUNTER — Other Ambulatory Visit: Payer: Self-pay | Admitting: Family Medicine

## 2018-09-06 DIAGNOSIS — G4733 Obstructive sleep apnea (adult) (pediatric): Secondary | ICD-10-CM | POA: Diagnosis not present

## 2018-09-14 DIAGNOSIS — D229 Melanocytic nevi, unspecified: Secondary | ICD-10-CM | POA: Diagnosis not present

## 2018-09-14 DIAGNOSIS — L821 Other seborrheic keratosis: Secondary | ICD-10-CM | POA: Diagnosis not present

## 2018-09-14 DIAGNOSIS — L820 Inflamed seborrheic keratosis: Secondary | ICD-10-CM | POA: Diagnosis not present

## 2018-09-14 DIAGNOSIS — D2230 Melanocytic nevi of unspecified part of face: Secondary | ICD-10-CM | POA: Diagnosis not present

## 2018-09-14 DIAGNOSIS — D225 Melanocytic nevi of trunk: Secondary | ICD-10-CM | POA: Diagnosis not present

## 2018-09-14 DIAGNOSIS — D1800 Hemangioma unspecified site: Secondary | ICD-10-CM | POA: Diagnosis not present

## 2018-09-14 DIAGNOSIS — L578 Other skin changes due to chronic exposure to nonionizing radiation: Secondary | ICD-10-CM | POA: Diagnosis not present

## 2018-09-14 DIAGNOSIS — D692 Other nonthrombocytopenic purpura: Secondary | ICD-10-CM | POA: Diagnosis not present

## 2018-09-14 DIAGNOSIS — L812 Freckles: Secondary | ICD-10-CM | POA: Diagnosis not present

## 2018-09-14 DIAGNOSIS — Z1283 Encounter for screening for malignant neoplasm of skin: Secondary | ICD-10-CM | POA: Diagnosis not present

## 2018-09-19 DIAGNOSIS — E890 Postprocedural hypothyroidism: Secondary | ICD-10-CM | POA: Diagnosis not present

## 2018-09-19 DIAGNOSIS — Z9089 Acquired absence of other organs: Secondary | ICD-10-CM | POA: Diagnosis not present

## 2018-09-19 DIAGNOSIS — R7303 Prediabetes: Secondary | ICD-10-CM | POA: Diagnosis not present

## 2018-09-19 DIAGNOSIS — E782 Mixed hyperlipidemia: Secondary | ICD-10-CM | POA: Diagnosis not present

## 2018-09-23 ENCOUNTER — Other Ambulatory Visit: Payer: Self-pay | Admitting: Family Medicine

## 2018-09-26 DIAGNOSIS — Z125 Encounter for screening for malignant neoplasm of prostate: Secondary | ICD-10-CM | POA: Diagnosis not present

## 2018-09-26 DIAGNOSIS — E890 Postprocedural hypothyroidism: Secondary | ICD-10-CM | POA: Diagnosis not present

## 2018-09-26 DIAGNOSIS — Z79899 Other long term (current) drug therapy: Secondary | ICD-10-CM | POA: Diagnosis not present

## 2018-09-26 DIAGNOSIS — E782 Mixed hyperlipidemia: Secondary | ICD-10-CM | POA: Diagnosis not present

## 2018-09-26 DIAGNOSIS — R7303 Prediabetes: Secondary | ICD-10-CM | POA: Diagnosis not present

## 2018-09-27 ENCOUNTER — Encounter: Payer: Self-pay | Admitting: Family Medicine

## 2018-10-06 ENCOUNTER — Encounter: Payer: Self-pay | Admitting: Family Medicine

## 2018-10-06 ENCOUNTER — Other Ambulatory Visit: Payer: Self-pay | Admitting: Family Medicine

## 2018-10-06 NOTE — Telephone Encounter (Signed)
Pt calling and wanting to know why his levothyroxine (SYNTHROID, LEVOTHROID) 112 MCG tablet hasn't been refilled. Please call pt.

## 2018-10-07 ENCOUNTER — Other Ambulatory Visit: Payer: Self-pay | Admitting: Family Medicine

## 2018-10-08 ENCOUNTER — Other Ambulatory Visit: Payer: Self-pay | Admitting: Family Medicine

## 2018-10-20 ENCOUNTER — Other Ambulatory Visit: Payer: Self-pay | Admitting: Family Medicine

## 2018-10-23 ENCOUNTER — Other Ambulatory Visit: Payer: Self-pay | Admitting: Family Medicine

## 2018-11-21 DIAGNOSIS — H43812 Vitreous degeneration, left eye: Secondary | ICD-10-CM | POA: Diagnosis not present

## 2018-11-21 DIAGNOSIS — H2513 Age-related nuclear cataract, bilateral: Secondary | ICD-10-CM | POA: Diagnosis not present

## 2018-12-07 DIAGNOSIS — G4733 Obstructive sleep apnea (adult) (pediatric): Secondary | ICD-10-CM | POA: Diagnosis not present

## 2018-12-14 ENCOUNTER — Other Ambulatory Visit: Payer: Self-pay

## 2018-12-14 MED ORDER — MONTELUKAST SODIUM 10 MG PO TABS: ORAL_TABLET | ORAL | 1 refills | Status: DC

## 2018-12-14 NOTE — Telephone Encounter (Signed)
I refilled the patient's Montelukast 10 mg tablet and sent it to [facility] per protocol.  Lianna Sitzmann,cma

## 2018-12-18 ENCOUNTER — Ambulatory Visit (INDEPENDENT_AMBULATORY_CARE_PROVIDER_SITE_OTHER): Payer: Medicare HMO | Admitting: Family Medicine

## 2018-12-18 ENCOUNTER — Encounter: Payer: Self-pay | Admitting: Family Medicine

## 2018-12-18 ENCOUNTER — Ambulatory Visit: Payer: Medicare HMO

## 2018-12-18 ENCOUNTER — Other Ambulatory Visit: Payer: Self-pay

## 2018-12-18 DIAGNOSIS — J309 Allergic rhinitis, unspecified: Secondary | ICD-10-CM | POA: Insufficient documentation

## 2018-12-18 DIAGNOSIS — G4733 Obstructive sleep apnea (adult) (pediatric): Secondary | ICD-10-CM | POA: Diagnosis not present

## 2018-12-18 DIAGNOSIS — E890 Postprocedural hypothyroidism: Secondary | ICD-10-CM | POA: Diagnosis not present

## 2018-12-18 DIAGNOSIS — H43399 Other vitreous opacities, unspecified eye: Secondary | ICD-10-CM | POA: Diagnosis not present

## 2018-12-18 DIAGNOSIS — I10 Essential (primary) hypertension: Secondary | ICD-10-CM

## 2018-12-18 DIAGNOSIS — J4520 Mild intermittent asthma, uncomplicated: Secondary | ICD-10-CM | POA: Diagnosis not present

## 2018-12-18 DIAGNOSIS — Z9989 Dependence on other enabling machines and devices: Secondary | ICD-10-CM | POA: Diagnosis not present

## 2018-12-18 NOTE — Assessment & Plan Note (Signed)
Stable.  He will continue his current regimen.

## 2018-12-18 NOTE — Assessment & Plan Note (Signed)
Patient will continue Singulair and as needed Allegra.

## 2018-12-18 NOTE — Progress Notes (Signed)
Virtual Visit via telephone Note  This visit type was conducted due to national recommendations for restrictions regarding the COVID-19 pandemic (e.g. social distancing).  This format is felt to be most appropriate for this patient at this time.  All issues noted in this document were discussed and addressed.  No physical exam was performed (except for noted visual exam findings with Video Visits).   I connected with Alexander Fitzgerald today at 11:00 AM EDT by telephone and verified that I am speaking with the correct person using two identifiers. Location patient: home Location provider: work Persons participating in the virtual visit: patient, provider  I discussed the limitations, risks, security and privacy concerns of performing an evaluation and management service by telephone and the availability of in person appointments. I also discussed with the patient that there may be a patient responsible charge related to this service. The patient expressed understanding and agreed to proceed.  Interactive audio and video telecommunications were attempted between this provider and patient, however failed, due to patient having technical difficulties OR patient did not have access to video capability.  We continued and completed visit with audio only.   Reason for visit: follow-up  HPI: HYPERTENSION  Disease Monitoring  Home BP Monitoring 120s/70s Chest pain- no    Dyspnea- no Medications  Compliance-  Taking lisinopril, torsemide.   Edema- no  Asthma: Patient notes this is stable.  He has only required albuterol 3 times in the last 3 months.  Uses Advair as needed.  No shortness of breath, cough, or wheezing.  Allergic rhinitis: Patient notes several weeks of rhinorrhea and watery eyes.  He notes no fevers.  He is on Singulair and does take Allegra as needed.  Floaters: Patient noted fairly sudden onset large floater.  He was seen by ophthalmology and they advised him it was age-related.   He is being rechecked for this next week.  Hypothyroidism: He is taking Synthroid.  He saw endocrinology.  He had lab work through them.  OSA: Using his CPAP daily.  Wears it for 6 to 8 hours.  No hypersomnia.  He is well rested when he wakes up.  He notes there is a big benefit to the CPAP compared to prior to using it.     ROS: See pertinent positives and negatives per HPI.  Past Medical History:  Diagnosis Date  . Asthma    adult onset, PFTs done in [place]  . Cancer ([place])    Several moles removed in past, all pre-cancerous.  Recently followed by Dr. Nehemiah Massed  . Depression    Treated with Prozac in 1992,  Associate with job loss  . Esophageal reflux   . Gout   . Hiatal hernia   . Hypertension     Past Surgical History:  Procedure Laterality Date  . APPENDECTOMY    . COLONOSCOPY N/A 12/27/2014   Procedure: COLONOSCOPY;  Surgeon: Lollie Sails, MD;  Location: [facility] ENDOSCOPY;  Service: Endoscopy;  Laterality: N/A;  . LAPAROSCOPIC PARAESOPHAGEAL HERNIA REPAIR    . TONSILLECTOMY      Family History  Problem Relation Age of Onset  . Hypertension Mother   . Hyperlipidemia Mother   . Diabetes Mother   . Heart attack Father 16       MI  . Hypertension Father   . Cancer Sister        pancreatic    SOCIAL HX: Former smoker.   Current Outpatient Medications:  .  ADVAIR DISKUS 250-50 MCG/DOSE  AEPB, TAKE 1 PUFF BY MOUTH AS NEEDED, Disp: 60 each, Rfl: 3 .  albuterol (VENTOLIN HFA) 108 (90 Base) MCG/ACT inhaler, INHALE 2 PUFFS 4 TIMES A DAY AS NEEDED, Disp: 8.5 g, Rfl: 2 .  Ascorbic Acid (VITAMIN C) 1000 MG tablet, Take 1,000 mg by mouth daily. Reported on 06/26/2015, Disp: , Rfl:  .  aspirin 81 MG chewable tablet, Chew by mouth daily., Disp: , Rfl:  .  Cholecalciferol (VITAMIN D3) 2000 units capsule, Take 4,000 Units by mouth., Disp: , Rfl:  .  Cyanocobalamin (VITAMIN B-12) 5000 MCG SUBL, Place 2 each under the tongue daily.  , Disp: , Rfl:  .  Dapsone (ACZONE) 5 % topical gel,  Aczone 5 % topical gel  APPLY A SMALL AMOUNT TO AFFECTED AREA EVERY MORNING, Disp: , Rfl:  .  docusate sodium (COLACE) 100 MG capsule, Take 100 mg by mouth daily., Disp: , Rfl:  .  doxycycline (VIBRAMYCIN) 50 MG capsule, Take 50 mg by mouth daily., Disp: , Rfl:  .  finasteride (PROSCAR) 5 MG tablet, TAKE 1 TABLET BY MOUTH EVERY DAY, Disp: 90 tablet, Rfl: 1 .  levothyroxine (SYNTHROID) 112 MCG tablet, TAKE 1TAB BY MOUTH ONCE DAILY ON EMPTY STOMACH WITH A GLASS OF WATER 30-60MIN BEFORE BREAKFAST, Disp: 90 tablet, Rfl: 3 .  lisinopril (PRINIVIL,ZESTRIL) 5 MG tablet, Take 1 tablet (5 mg total) by mouth daily., Disp: 90 tablet, Rfl: 1 .  meclofenamate (MECLOMEN) 100 MG capsule, Take 100 mg by mouth every 6 (six) hours., Disp: , Rfl:  .  metroNIDAZOLE (METROGEL) 1 % gel, metronidazole 1 % topical gel  APPLY TO AFFECTED AREA EVERY DAY, Disp: , Rfl:  .  montelukast (SINGULAIR) 10 MG tablet, TAKE 1 TABLET BY MOUTH EVERYDAY AT BEDTIME, Disp: 90 tablet, Rfl: 1 .  Multiple Vitamin (MULTIVITAMIN) capsule, Take 1 capsule by mouth daily.  , Disp: , Rfl:  .  oxymetazoline (AFRIN) 0.05 % nasal spray, Place 1 spray into both nostrils 2 (two) times daily., Disp: 30 mL, Rfl: 2 .  pantoprazole (PROTONIX) 40 MG tablet, TAKE 1 TABLET BY MOUTH EVERY DAY BEFORE A MEAL AS DIRECTED, Disp: 90 tablet, Rfl: 3 .  Pyridoxine HCl (VITAMIN B-6 CR) 200 MG TBCR, Take by mouth.  , Disp: , Rfl:  .  torsemide (DEMADEX) 5 MG tablet, TAKE 1 TABLET BY MOUTH EVERY DAY, Disp: 90 tablet, Rfl: 1  EXAM: This was a telehealth telephone visit notes no physical exam was completed.  ASSESSMENT AND PLAN:  Discussed the following assessment and plan:  Hypertension Adequately controlled.  Recently had lab work through his endocrinologist.  This was reviewed.  Continue current regimen.  Asthma Stable.  He will continue his current regimen.  Allergic rhinitis Patient will continue Singulair and as needed Allegra.  OSA on CPAP Stable.   Benefiting from CPAP.  Continue CPAP.  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  TSH stable.  Continue Synthroid.  Floaters He will keep his follow-up with allergy.   Social distancing precautions and sick precautions given regarding COVID-19.   I discussed the assessment and treatment plan with the patient. The patient was provided an opportunity to ask questions and all were answered. The patient agreed with the plan and demonstrated an understanding of the instructions.   The patient was advised to call back or seek an in-person evaluation if the symptoms worsen or if the condition fails to improve as anticipated.  I provided 11 minutes of non-face-to-face time during this encounter.  [name], MD

## 2018-12-18 NOTE — Assessment & Plan Note (Signed)
Adequately controlled.  Recently had lab work through his endocrinologist.  This was reviewed.  Continue current regimen.

## 2018-12-18 NOTE — Assessment & Plan Note (Signed)
He will continue to see endocrinology.  TSH stable.  Continue Synthroid.

## 2018-12-18 NOTE — Assessment & Plan Note (Signed)
He will keep his follow-up with allergy.

## 2018-12-18 NOTE — Assessment & Plan Note (Signed)
Stable.  Benefiting from CPAP.  Continue CPAP.

## 2018-12-24 ENCOUNTER — Other Ambulatory Visit: Payer: Self-pay | Admitting: Family Medicine

## 2018-12-27 DIAGNOSIS — H2513 Age-related nuclear cataract, bilateral: Secondary | ICD-10-CM | POA: Diagnosis not present

## 2018-12-27 DIAGNOSIS — H43812 Vitreous degeneration, left eye: Secondary | ICD-10-CM | POA: Diagnosis not present

## 2019-01-01 DIAGNOSIS — R69 Illness, unspecified: Secondary | ICD-10-CM | POA: Diagnosis not present

## 2019-01-09 ENCOUNTER — Ambulatory Visit (INDEPENDENT_AMBULATORY_CARE_PROVIDER_SITE_OTHER): Payer: Medicare HMO

## 2019-01-09 ENCOUNTER — Other Ambulatory Visit: Payer: Self-pay

## 2019-01-09 DIAGNOSIS — Z0000 Encounter for general adult medical examination without abnormal findings: Secondary | ICD-10-CM | POA: Diagnosis not present

## 2019-01-09 NOTE — Patient Instructions (Addendum)
  Alexander Fitzgerald , Thank you for taking time to come for your Medicare Wellness Visit. I appreciate your ongoing commitment to your health goals. Please review the following plan we discussed and let me know if I can assist you in the future.   These are the goals we discussed: Goals      Patient Stated   . Weight 185lb (pt-stated)     I want to lose weight.  Walk 3 miles daily       This is a list of the screening recommended for you and due dates:  Health Maintenance  Topic Date Due  . Tetanus Vaccine  04/27/2019  . Colon Cancer Screening  12/26/2024  . Flu Shot  Completed  .  Hepatitis C: One time screening is recommended by Center for Disease Control  (CDC) for  adults born from [birth year] through 1964.   Completed  . Pneumonia vaccines  Completed

## 2019-01-09 NOTE — Progress Notes (Signed)
Subjective:   Alexander Fitzgerald is a 73 y.o. male who presents for Medicare Annual/Subsequent preventive examination.  Review of Systems:  No ROS.  Medicare Wellness Virtual Visit.  Visual/audio telehealth visit, UTA vital signs.   See social history for additional risk factors.   Cardiac Risk Factors include: advanced age (>44men, >60 women);hypertension;male gender     Objective:    Vitals: There were no vitals taken for this visit.  There is no height or weight on file to calculate BMI.  Advanced Directives 01/09/2019 12/16/2017 05/05/2015  Does Patient Have a Medical Advance Directive? Yes Yes Yes  Type of Paramedic of [place];Living will [place];Living will [place];Living will  Does patient want to make changes to medical advance directive? No - Patient declined No - Patient declined No - Patient declined  Copy of [place] in Chart? No - copy requested No - copy requested No - copy requested    Tobacco Social History   Tobacco Use  Smoking Status Former Smoker  . Packs/day: 0.50  . Years: 20.00  . Pack years: 10.00  . Types: Cigarettes  . Quit date: 06/15/1999  . Years since quitting: 19.5  Smokeless Tobacco Never Used     Counseling given: Not Answered   Clinical Intake:  Pre-visit preparation completed: Yes        Diabetes: No  How often do you need to have someone help you when you read instructions, pamphlets, or other written materials from your doctor or pharmacy?: 1 - Never  Interpreter Needed?: No     Past Medical History:  Diagnosis Date  . Asthma    adult onset, PFTs done in [place]  . Cancer ([place])    Several moles removed in past, all pre-cancerous.  Recently followed by Dr. Nehemiah Massed  . Depression    Treated with Prozac in 1992,  Associate with job loss  . Esophageal reflux   . Gout   . Hiatal hernia   . Hypertension    Past Surgical History:   Procedure Laterality Date  . APPENDECTOMY    . COLONOSCOPY N/A 12/27/2014   Procedure: COLONOSCOPY;  Surgeon: Lollie Sails, MD;  [facility] ENDOSCOPY;  Service: Endoscopy;  Laterality: N/A;  . LAPAROSCOPIC PARAESOPHAGEAL HERNIA REPAIR    . TONSILLECTOMY     Family History  Problem Relation Age of Onset  . Hypertension Mother   . Hyperlipidemia Mother   . Diabetes Mother   . Heart attack Father 38       MI  . Hypertension Father   . Cancer Sister        pancreatic   Social History   Socioeconomic History  . Marital status: Married    Spouse name: Not on file  . Number of children: Not on file  . Years of education: Not on file  . Highest education level: Not on file  Occupational History  . Not on file  Social Needs  . Financial resource strain: Not hard at all  . Food insecurity    Worry: Never true    Inability: Never true  . Transportation needs    Medical: No    Non-medical: No  Tobacco Use  . Smoking status: Former Smoker    Packs/day: 0.50    Years: 20.00    Pack years: 10.00    Types: Cigarettes    Quit date: 06/15/1999    Years since quitting: 19.5  . Smokeless  tobacco: Never Used  Substance and Sexual Activity  . Alcohol use: Yes    Alcohol/week: 1.0 standard drinks    Types: 1 Standard drinks or equivalent per week    Comment: rarely  . Drug use: No  . Sexual activity: Yes  Lifestyle  . Physical activity    Days per week: Not on file    Minutes per session: Not on file  . Stress: Not at all  Relationships  . Social Herbalist on phone: Not on file    Gets together: Not on file    Attends religious service: Not on file    Active member of club or organization: Not on file    Attends meetings of clubs or organizations: Not on file    Relationship status: Not on file  Other Topics Concern  . Not on file  Social History Narrative  . Not on file    Outpatient Encounter Medications as of 01/09/2019  Medication Sig  . ADVAIR  DISKUS 250-50 MCG/DOSE AEPB TAKE 1 PUFF BY MOUTH AS NEEDED  . albuterol (VENTOLIN HFA) 108 (90 Base) MCG/ACT inhaler INHALE 2 PUFFS 4 TIMES A DAY AS NEEDED  . Ascorbic Acid (VITAMIN C) 1000 MG tablet Take 1,000 mg by mouth daily. Reported on 06/26/2015  . aspirin 81 MG chewable tablet Chew by mouth daily.  . Cholecalciferol (VITAMIN D3) 2000 units capsule Take 4,000 Units by mouth.  . Cyanocobalamin (VITAMIN B-12) 5000 MCG SUBL Place 2 each under the tongue daily.    . Dapsone (ACZONE) 5 % topical gel Aczone 5 % topical gel  APPLY A SMALL AMOUNT TO AFFECTED AREA EVERY MORNING  . docusate sodium (COLACE) 100 MG capsule Take 100 mg by mouth daily.  Marland Kitchen doxycycline (VIBRAMYCIN) 50 MG capsule Take 50 mg by mouth daily.  . finasteride (PROSCAR) 5 MG tablet TAKE 1 TABLET BY MOUTH EVERY DAY  . levothyroxine (SYNTHROID) 112 MCG tablet TAKE 1TAB BY MOUTH ONCE DAILY ON EMPTY STOMACH WITH A GLASS OF WATER 30-60MIN BEFORE BREAKFAST  . lisinopril (ZESTRIL) 5 MG tablet TAKE 1 TABLET BY MOUTH EVERY DAY  . meclofenamate (MECLOMEN) 100 MG capsule Take 100 mg by mouth every 6 (six) hours.  . metroNIDAZOLE (METROGEL) 1 % gel metronidazole 1 % topical gel  APPLY TO AFFECTED AREA EVERY DAY  . montelukast (SINGULAIR) 10 MG tablet TAKE 1 TABLET BY MOUTH EVERYDAY AT BEDTIME  . Multiple Vitamin (MULTIVITAMIN) capsule Take 1 capsule by mouth daily.    Marland Kitchen oxymetazoline (AFRIN) 0.05 % nasal spray Place 1 spray into both nostrils 2 (two) times daily.  . pantoprazole (PROTONIX) 40 MG tablet TAKE 1 TABLET BY MOUTH EVERY DAY BEFORE A MEAL AS DIRECTED  . Pyridoxine HCl (VITAMIN B-6 CR) 200 MG TBCR Take by mouth.    . torsemide (DEMADEX) 5 MG tablet TAKE 1 TABLET BY MOUTH EVERY DAY   No facility-administered encounter medications on file as of 01/09/2019.     Activities of Daily Living In your present state of health, do you have any difficulty performing the following activities: 01/09/2019  Hearing? Y  Comment Hearing aids   Vision? N  Difficulty concentrating or making decisions? N  Walking or climbing stairs? N  Dressing or bathing? N  Doing errands, shopping? N  Preparing Food and eating ? N  Using the Toilet? N  In the past six months, have you accidently leaked urine? N  Do you have problems with loss of bowel control? N  Managing your Medications? N  Managing your Finances? N  Housekeeping or managing your Housekeeping? N  Some recent data might be hidden    Patient Care Team: Leone Haven, MD as PCP - General (Family Medicine) Minna Merritts, MD as Consulting Physician (Cardiology)   Assessment:   This is a routine wellness examination for [place].  I connected with patient 01/09/19 at  9:30 AM EDT by an audio enabled telemedicine application and verified that I am speaking with the correct person using two identifiers. Patient stated full name and DOB. Patient gave permission to continue with virtual visit. [name] location was at home and Nurse's location was at [place] office.   Health Maintenance Due: See completed HM at the end of note.   Eye: Visual acuity not assessed. Virtual visit. Wears corrective lenses. Followed by their ophthalmologist every 12 months.   Dental: Visits every 6 months.    Hearing: Hearing aids- yes  Safety:  Patient feels safe at home- yes Patient does have smoke detectors at home- yes Patient does wear sunscreen or protective clothing when in direct sunlight - yes Patient does wear seat belt when in a moving vehicle - yes Patient drives- yes Adequate lighting in walkways free from debris- yes Grab bars and handrails used as appropriate- yes Ambulates with no assistive device Cell phone on person when ambulating outside of the home- yes  Social: Alcohol intake - yes      Smoking history- former   Smokers in home? none Illicit drug use? none  Depression: PHQ 2 &9 complete. See screening below. Denies irritability, anhedonia,  sadness/tearfullness.  Stable.   Falls: See screening below.    Medication: Taking as directed and without issues.   Covid-19: Precautions and sickness symptoms discussed. Wears mask, social distancing, hand hygiene as appropriate.   Activities of Daily Living Patient denies needing assistance with: household chores, feeding themselves, getting from bed to chair, getting to the toilet, bathing/showering, dressing, managing money, or preparing meals.   Memory: Patient is alert. Patient denies difficulty focusing or concentrating. Correctly identified the president of the [place], season and recall. Patient likes to read for brain stimulation.  BMI- discussed the importance of a healthy diet, water intake and the benefits of aerobic exercise.  Educational material provided.  Physical activity- walking, wood turning, gardening  Diet: Weight Watchers Regular Water: good intake Caffeine: 2 cups of coffee  Advanced Directive: End of life planning; Advance aging; Advanced directives discussed.  Copy of current HCPOA/Living Will requested.    Other Providers Patient Care Team: Leone Haven, MD as PCP - General (Family Medicine) Minna Merritts, MD as Consulting Physician (Cardiology)  Exercise Activities and Dietary recommendations Current Exercise Habits: Home exercise routine, Type of exercise: walking, Time (Minutes): 30, Frequency (Times/Week): 5, Weekly Exercise (Minutes/Week): 150, Intensity: Mild  Goals      Patient Stated   . Weight 185lb (pt-stated)     I want to lose weight.  Walk 3 miles daily       Fall Risk Fall Risk  01/09/2019 12/18/2018 12/16/2017 07/11/2017 05/18/2017  Falls in the past year? 0 0 No No No  Number falls in past yr: - 0 - - -  Follow up - Falls evaluation completed - - -   Timed Get Up and Go Performed: no, virtual visit  Depression Screen PHQ 2/9 Scores 01/09/2019 12/18/2018 12/16/2017 05/13/2016  PHQ - 2 Score 0 0 0 0    Cognitive  Function MMSE -  Mini Mental State Exam 12/16/2017 05/05/2015  Orientation to time 5 5  Orientation to Place 5 5  Registration 3 3  Attention/ Calculation 5 5  Recall 3 3  Language- name 2 objects 2 2  Language- repeat 1 1  Language- follow 3 step command 3 3  Language- read & follow direction 1 1  Write a sentence 1 1  Copy design 1 1  Total score 30 30     6CIT Screen 01/09/2019  What Year? 0 points  What month? 0 points  What time? 0 points  Count back from 20 0 points  Months in reverse 0 points  Repeat phrase 0 points  Total Score 0    Immunization History  Administered Date(s) Administered  . Influenza Split 03/03/2013  . Influenza, High Dose Seasonal PF 02/08/2017, 02/13/2018  . Influenza,inj,Quad PF,6+ Mos 01/16/2014  . Influenza-Unspecified 01/25/2012, 02/18/2015, 02/01/2016, 02/08/2017  . Pneumococcal Conjugate-13 06/04/2013  . Pneumococcal Polysaccharide-23 10/25/2008, 05/05/2015  . Tdap 04/26/2009  . Zoster 03/03/2013  . Zoster Recombinat (Shingrix) 09/19/2017, 10/12/2017   Screening Tests Health Maintenance  Topic Date Due  . TETANUS/TDAP  04/27/2019  . COLONOSCOPY  12/26/2024  . INFLUENZA VACCINE  Completed  . Hepatitis C Screening  Completed  . PNA vac Low Risk Adult  Completed      Plan:    Keep all routine maintenance appointments.   Medicare Attestation I have personally reviewed: The patient's medical and social history Their use of alcohol, tobacco or illicit drugs Their current medications and supplements The patient's functional ability including ADLs,fall risks, home safety risks, cognitive, and hearing and visual impairment Diet and physical activities Evidence for depression   In addition, I have reviewed and discussed with patient certain preventive protocols, quality metrics, and best practice recommendations. A written personalized care plan for preventive services as well as general preventive health recommendations were provided to  patient via mail.     Varney Biles, LPN  [id number]

## 2019-01-16 DIAGNOSIS — R69 Illness, unspecified: Secondary | ICD-10-CM | POA: Diagnosis not present

## 2019-02-22 ENCOUNTER — Encounter: Payer: Self-pay | Admitting: Family Medicine

## 2019-02-22 MED ORDER — FLUTICASONE PROPIONATE 50 MCG/ACT NA SUSP: 2.0000 | Freq: Every day | NASAL | 6 refills | Status: DC

## 2019-02-23 ENCOUNTER — Encounter: Payer: Self-pay | Admitting: Family Medicine

## 2019-03-09 DIAGNOSIS — G4733 Obstructive sleep apnea (adult) (pediatric): Secondary | ICD-10-CM | POA: Diagnosis not present

## 2019-03-12 DIAGNOSIS — K449 Diaphragmatic hernia without obstruction or gangrene: Secondary | ICD-10-CM | POA: Insufficient documentation

## 2019-03-12 DIAGNOSIS — Z9889 Other specified postprocedural states: Secondary | ICD-10-CM | POA: Insufficient documentation

## 2019-03-13 ENCOUNTER — Ambulatory Visit (INDEPENDENT_AMBULATORY_CARE_PROVIDER_SITE_OTHER): Payer: Medicare HMO | Admitting: Podiatry

## 2019-03-13 ENCOUNTER — Other Ambulatory Visit: Payer: Self-pay

## 2019-03-13 ENCOUNTER — Encounter: Payer: Self-pay | Admitting: Podiatry

## 2019-03-13 DIAGNOSIS — T148XXA Other injury of unspecified body region, initial encounter: Secondary | ICD-10-CM

## 2019-03-13 DIAGNOSIS — S90229A Contusion of unspecified lesser toe(s) with damage to nail, initial encounter: Secondary | ICD-10-CM | POA: Diagnosis not present

## 2019-03-13 DIAGNOSIS — M79675 Pain in left toe(s): Secondary | ICD-10-CM

## 2019-03-16 ENCOUNTER — Encounter: Payer: Self-pay | Admitting: Podiatry

## 2019-03-16 NOTE — Progress Notes (Signed)
Subjective:  Patient ID: Alexander Fitzgerald, male    DOB: 1945-10-19,  MRN: [id number]  Chief Complaint  Patient presents with  . Nail Problem    73 y.o. male presents with the above complaint.  Patient presents with left hallux nail contusion after dropping something on his foot that he cannot recall.  Patient states that now the nail is black this happened about a month ago it is tender to touch and with pressure.  No pain with walking but it has been causing him a lot of pain when there is a pressure to it.  Patient denies soaking it or any other various treatment modalities for this.  [name] denies seeing any other podiatrist for this.   Review of Systems: Negative except as noted in the HPI. Denies N/V/F/Ch.  Past Medical History:  Diagnosis Date  . Asthma    adult onset, PFTs done in [place]  . Cancer ([place])    Several moles removed in past, all pre-cancerous.  Recently followed by Dr. Nehemiah Massed  . Depression    Treated with Prozac in 1992,  Associate with job loss  . Esophageal reflux   . Gout   . Hiatal hernia   . Hypertension     Current Outpatient Medications:  .  ADVAIR DISKUS 250-50 MCG/DOSE AEPB, TAKE 1 PUFF BY MOUTH AS NEEDED, Disp: 60 each, Rfl: 3 .  albuterol (VENTOLIN HFA) 108 (90 Base) MCG/ACT inhaler, INHALE 2 PUFFS 4 TIMES A DAY AS NEEDED, Disp: 8.5 g, Rfl: 2 .  Ascorbic Acid (VITAMIN C) 1000 MG tablet, Take 1,000 mg by mouth daily. Reported on 06/26/2015, Disp: , Rfl:  .  aspirin 81 MG chewable tablet, Chew by mouth daily., Disp: , Rfl:  .  Cholecalciferol (VITAMIN D3) 2000 units capsule, Take 4,000 Units by mouth., Disp: , Rfl:  .  Cyanocobalamin (VITAMIN B-12) 5000 MCG SUBL, Place 2 each under the tongue daily.  , Disp: , Rfl:  .  Dapsone (ACZONE) 5 % topical gel, Aczone 5 % topical gel  APPLY A SMALL AMOUNT TO AFFECTED AREA EVERY MORNING, Disp: , Rfl:  .  docusate sodium (COLACE) 100 MG capsule, Take 100 mg by mouth daily., Disp: , Rfl:  .  finasteride (PROSCAR)  5 MG tablet, TAKE 1 TABLET BY MOUTH EVERY DAY, Disp: 90 tablet, Rfl: 1 .  fluticasone (FLONASE) 50 MCG/ACT nasal spray, Place 2 sprays into both nostrils daily., Disp: 16 g, Rfl: 6 .  levothyroxine (SYNTHROID) 112 MCG tablet, TAKE 1TAB BY MOUTH ONCE DAILY ON EMPTY STOMACH WITH A GLASS OF WATER 30-60MIN BEFORE BREAKFAST, Disp: 90 tablet, Rfl: 3 .  lisinopril (ZESTRIL) 5 MG tablet, TAKE 1 TABLET BY MOUTH EVERY DAY, Disp: 90 tablet, Rfl: 1 .  metroNIDAZOLE (METROGEL) 1 % gel, metronidazole 1 % topical gel  APPLY TO AFFECTED AREA EVERY DAY, Disp: , Rfl:  .  montelukast (SINGULAIR) 10 MG tablet, TAKE 1 TABLET BY MOUTH EVERYDAY AT BEDTIME, Disp: 90 tablet, Rfl: 1 .  Multiple Vitamin (MULTIVITAMIN) capsule, Take 1 capsule by mouth daily.  , Disp: , Rfl:  .  pantoprazole (PROTONIX) 40 MG tablet, TAKE 1 TABLET BY MOUTH EVERY DAY BEFORE A MEAL AS DIRECTED, Disp: 90 tablet, Rfl: 3 .  Pyridoxine HCl (VITAMIN B-6 CR) 200 MG TBCR, Take by mouth.  , Disp: , Rfl:  .  torsemide (DEMADEX) 5 MG tablet, TAKE 1 TABLET BY MOUTH EVERY DAY, Disp: 90 tablet, Rfl: 1  Social History   Tobacco Use  Smoking  Status Former Smoker  . Packs/day: 0.50  . Years: 20.00  . Pack years: 10.00  . Types: Cigarettes  . Quit date: 06/15/1999  . Years since quitting: 19.7  Smokeless Tobacco Never Used    Allergies  Allergen Reactions  . Sulfa Drugs Cross Reactors     Blood in urine   Objective:  There were no vitals filed for this visit. There is no height or weight on file to calculate BMI. Constitutional Well developed. Well nourished.  Vascular Dorsalis pedis pulses palpable bilaterally. Posterior tibial pulses palpable bilaterally. Capillary refill normal to all digits.  No cyanosis or clubbing noted. Pedal hair growth normal.  Neurologic Normal speech. Oriented to person, place, and time. Epicritic sensation to light touch grossly present bilaterally.  Dermatologic Pain on palpation of the entire/total nail on 1st  digit of the left Greater than 90% of the nail is darker with a possible underlying hematoma.  Orthopedic: Normal joint ROM without pain or crepitus bilaterally. No visible deformities. No bony tenderness.   Radiographs: None Assessment:  No diagnosis found. Plan:  Patient was evaluated and treated and all questions answered.  Nail contusion/dystrophy left hallux with greater than 90% hematoma, left -Patient elects to proceed with minor surgery to remove entire toenail today. Consent reviewed and signed by patient. -Entire/total nail excised. See procedure note. -Educated on post-procedure care including soaking. Written instructions provided and reviewed. -No underlying laceration noted.  All the hematoma was evacuated.  Clean healthy granular nail bed noted underneath.  Procedure: Excision of entire/total nail  Location: Left 1st toe digit Anesthesia: Lidocaine 1% plain; 1.5 mL and Marcaine 0.5% plain; 1.5 mL, digital block. Skin Prep: Betadine. Dressing: Silvadene; telfa; dry, sterile, compression dressing. Technique: Following skin prep, the toe was exsanguinated and a tourniquet was secured at the base of the toe. The affected nail border was freed and excised. The tourniquet was then removed and sterile dressing applied. Disposition: Patient tolerated procedure well. Patient to return in 2 weeks for follow-up.   No follow-ups on file.

## 2019-04-21 ENCOUNTER — Other Ambulatory Visit: Payer: Self-pay | Admitting: Family Medicine

## 2019-05-10 ENCOUNTER — Encounter: Payer: Self-pay | Admitting: Family Medicine

## 2019-06-11 DIAGNOSIS — G4733 Obstructive sleep apnea (adult) (pediatric): Secondary | ICD-10-CM | POA: Diagnosis not present

## 2019-06-17 ENCOUNTER — Other Ambulatory Visit: Payer: Self-pay | Admitting: Family Medicine

## 2019-06-18 DIAGNOSIS — Z0100 Encounter for examination of eyes and vision without abnormal findings: Secondary | ICD-10-CM | POA: Diagnosis not present

## 2019-06-18 DIAGNOSIS — H2513 Age-related nuclear cataract, bilateral: Secondary | ICD-10-CM | POA: Diagnosis not present

## 2019-06-18 DIAGNOSIS — H43812 Vitreous degeneration, left eye: Secondary | ICD-10-CM | POA: Diagnosis not present

## 2019-06-23 ENCOUNTER — Other Ambulatory Visit: Payer: Self-pay | Admitting: Family Medicine

## 2019-07-23 ENCOUNTER — Other Ambulatory Visit: Payer: Self-pay | Admitting: Family Medicine

## 2019-08-15 ENCOUNTER — Other Ambulatory Visit: Payer: Self-pay | Admitting: Family Medicine

## 2019-09-10 DIAGNOSIS — G4733 Obstructive sleep apnea (adult) (pediatric): Secondary | ICD-10-CM | POA: Diagnosis not present

## 2019-09-11 ENCOUNTER — Other Ambulatory Visit: Payer: Self-pay | Admitting: Dermatology

## 2019-09-14 ENCOUNTER — Other Ambulatory Visit: Payer: Self-pay | Admitting: Family Medicine

## 2019-09-24 ENCOUNTER — Encounter: Payer: Self-pay | Admitting: Family Medicine

## 2019-09-24 DIAGNOSIS — E782 Mixed hyperlipidemia: Secondary | ICD-10-CM | POA: Insufficient documentation

## 2019-09-24 DIAGNOSIS — Z79899 Other long term (current) drug therapy: Secondary | ICD-10-CM | POA: Diagnosis not present

## 2019-09-24 DIAGNOSIS — R7303 Prediabetes: Secondary | ICD-10-CM | POA: Insufficient documentation

## 2019-09-24 DIAGNOSIS — E890 Postprocedural hypothyroidism: Secondary | ICD-10-CM | POA: Diagnosis not present

## 2019-09-26 ENCOUNTER — Other Ambulatory Visit: Payer: Self-pay | Admitting: Family Medicine

## 2019-10-24 ENCOUNTER — Other Ambulatory Visit: Payer: Self-pay | Admitting: Family Medicine

## 2019-10-29 ENCOUNTER — Encounter: Payer: Self-pay | Admitting: Family Medicine

## 2019-10-30 MED ORDER — FLUTICASONE-SALMETEROL 250-50 MCG/DOSE IN AEPB: INHALATION_SPRAY | RESPIRATORY_TRACT | 3 refills | Status: DC

## 2019-10-30 MED ORDER — ALBUTEROL SULFATE HFA 108 (90 BASE) MCG/ACT IN AERS: INHALATION_SPRAY | RESPIRATORY_TRACT | 2 refills | Status: DC

## 2019-11-22 ENCOUNTER — Ambulatory Visit: Payer: Medicare HMO | Admitting: Dermatology

## 2019-12-12 DIAGNOSIS — G4733 Obstructive sleep apnea (adult) (pediatric): Secondary | ICD-10-CM | POA: Diagnosis not present

## 2019-12-13 ENCOUNTER — Encounter: Payer: Self-pay | Admitting: Dermatology

## 2019-12-13 ENCOUNTER — Other Ambulatory Visit: Payer: Self-pay

## 2019-12-13 ENCOUNTER — Ambulatory Visit: Payer: Medicare HMO | Admitting: Dermatology

## 2019-12-13 DIAGNOSIS — Z1283 Encounter for screening for malignant neoplasm of skin: Secondary | ICD-10-CM

## 2019-12-13 DIAGNOSIS — D229 Melanocytic nevi, unspecified: Secondary | ICD-10-CM | POA: Diagnosis not present

## 2019-12-13 DIAGNOSIS — L821 Other seborrheic keratosis: Secondary | ICD-10-CM

## 2019-12-13 DIAGNOSIS — L814 Other melanin hyperpigmentation: Secondary | ICD-10-CM | POA: Diagnosis not present

## 2019-12-13 DIAGNOSIS — L820 Inflamed seborrheic keratosis: Secondary | ICD-10-CM

## 2019-12-13 DIAGNOSIS — Z85828 Personal history of other malignant neoplasm of skin: Secondary | ICD-10-CM

## 2019-12-13 DIAGNOSIS — D1800 Hemangioma unspecified site: Secondary | ICD-10-CM

## 2019-12-13 DIAGNOSIS — L570 Actinic keratosis: Secondary | ICD-10-CM | POA: Diagnosis not present

## 2019-12-13 DIAGNOSIS — L578 Other skin changes due to chronic exposure to nonionizing radiation: Secondary | ICD-10-CM | POA: Diagnosis not present

## 2019-12-13 NOTE — Progress Notes (Signed)
° °  Follow-Up Visit   Subjective  Alexander Fitzgerald is a 74 y.o. male who presents for the following: Annual Exam (Pt here for TBSE, concerned about a few spots on his head ) and Skin Cancer (Hx of SCC R ant neck ). The patient presents for Total-Body Skin Exam (TBSE) for skin cancer screening and mole check.  The following portions of the chart were reviewed this encounter and updated as appropriate:  Tobacco   Allergies   Meds   Problems   Med Hx   Surg Hx   Fam Hx      Review of Systems:  No other skin or systemic complaints except as noted in HPI or Assessment and Plan.  Objective  Well appearing patient in no apparent distress; mood and affect are within normal limits.  A full examination was performed including scalp, head, eyes, ears, nose, lips, neck, chest, axillae, abdomen, back, buttocks, bilateral upper extremities, bilateral lower extremities, hands, feet, fingers, toes, fingernails, and toenails. All findings within normal limits unless otherwise noted below.  Objective  Head - Anterior (Face) (11): Erythematous thin papules/macules with gritty scale.   Objective  Left Upper Back (24): Erythematous keratotic or waxy stuck-on papule or plaque.   Objective  R anterior lateral neck: Well healed scar with no evidence of recurrence, no lymphadenopathy.    Assessment & Plan    Lentigines - Scattered tan macules - Discussed due to sun exposure - Benign, observe - Call for any changes  Seborrheic Keratoses - Stuck-on, waxy, tan-brown papules and plaques  - Discussed benign etiology and prognosis. - Observe - Call for any changes  Melanocytic Nevi - Tan-brown and/or pink-flesh-colored symmetric macules and papules - Benign appearing on exam today - Observation - Call clinic for new or changing moles - Recommend daily use of broad spectrum spf 30+ sunscreen to sun-exposed areas.   Hemangiomas - Red papules - Discussed benign nature - Observe - Call for any  changes  Actinic Damage - diffuse scaly erythematous macules with underlying dyspigmentation - Recommend daily broad spectrum sunscreen SPF 30+ to sun-exposed areas, reapply every 2 hours as needed.  - Call for new or changing lesions.  Skin cancer screening performed today.  AK (actinic keratosis) (11) Head - Anterior (Face)  Destruction of lesion - Head - Anterior (Face) Complexity: simple   Destruction method: cryotherapy   Informed consent: discussed and consent obtained   Timeout:  patient name, date of birth, surgical site, and procedure verified Lesion destroyed using liquid nitrogen: Yes   Region frozen until ice ball extended beyond lesion: Yes   Outcome: patient tolerated procedure well with no complications   Post-procedure details: wound care instructions given    Inflamed seborrheic keratosis (24) Left Upper Back  Destruction of lesion - Left Upper Back  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 Observe for recurrence. Call clinic for new or changing lesions.  Recommend regular skin exams, daily broad-spectrum spf 30+ sunscreen use, and photoprotection.     Return in about 6 months (around 06/14/2020) for AKS.  IMarye Round, CMA, am acting as scribe for Sarina Ser, MD .  Documentation: I have reviewed the above documentation for accuracy and completeness, and I agree with the above.  Sarina Ser, MD

## 2019-12-13 NOTE — Patient Instructions (Signed)
Cryotherapy Aftercare  . Wash gently with soap and water everyday.   Marland Kitchen Apply Vaseline and Band-Aid daily until healed. Recommend daily broad spectrum sunscreen SPF 30+ to sun-exposed areas, reapply every 2 hours as needed. Call for new or changing lesions. Melanoma ABCDEs  Melanoma is the most dangerous type of skin cancer, and is the leading cause of death from skin disease.  You are more likely to develop melanoma if you: Have light-colored skin, light-colored eyes, or red or blond hair Spend a lot of time in the sun Tan regularly, either outdoors or in a tanning bed Have had blistering sunburns, especially during childhood Have a close family member who has had a melanoma Have atypical moles or large birthmarks  Early detection of melanoma is key since treatment is typically straightforward and cure rates are extremely high if we catch it early.   The first sign of melanoma is often a change in a mole or a new dark spot.  The ABCDE system is a way of remembering the signs of melanoma.  A for asymmetry:  The two halves do not match. B for border:  The edges of the growth are irregular. C for color:  A mixture of colors are present instead of an even brown color. D for diameter:  Melanomas are usually (but not always) greater than 46mm - the size of a pencil eraser. E for evolution:  The spot keeps changing in size, shape, and color.  Please check your skin once per month between visits. You can use a small mirror in front and a large mirror behind you to keep an eye on the back side or your body.   If you see any new or changing lesions before your next follow-up, please call to schedule a visit.  Please continue daily skin protection including broad spectrum sunscreen SPF 30+ to sun-exposed areas, reapplying every 2 hours as needed when you're outdoors.

## 2019-12-14 ENCOUNTER — Other Ambulatory Visit: Payer: Self-pay | Admitting: Family Medicine

## 2019-12-19 ENCOUNTER — Other Ambulatory Visit: Payer: Self-pay

## 2019-12-19 ENCOUNTER — Encounter: Payer: Self-pay | Admitting: Family Medicine

## 2019-12-19 ENCOUNTER — Ambulatory Visit (INDEPENDENT_AMBULATORY_CARE_PROVIDER_SITE_OTHER): Payer: Medicare HMO | Admitting: Family Medicine

## 2019-12-19 VITALS — BP 115/70 | HR 88 | Temp 98.8°F | Ht 69.0 in | Wt 216.6 lb

## 2019-12-19 DIAGNOSIS — J4530 Mild persistent asthma, uncomplicated: Secondary | ICD-10-CM

## 2019-12-19 DIAGNOSIS — Z23 Encounter for immunization: Secondary | ICD-10-CM

## 2019-12-19 DIAGNOSIS — Z0000 Encounter for general adult medical examination without abnormal findings: Secondary | ICD-10-CM | POA: Diagnosis not present

## 2019-12-19 DIAGNOSIS — E669 Obesity, unspecified: Secondary | ICD-10-CM

## 2019-12-19 DIAGNOSIS — E7800 Pure hypercholesterolemia, unspecified: Secondary | ICD-10-CM

## 2019-12-19 DIAGNOSIS — E890 Postprocedural hypothyroidism: Secondary | ICD-10-CM | POA: Diagnosis not present

## 2019-12-19 DIAGNOSIS — Z125 Encounter for screening for malignant neoplasm of prostate: Secondary | ICD-10-CM

## 2019-12-19 LAB — LIPID PANEL
Cholesterol: 162 mg/dL (ref 0–200)
HDL: 44.4 mg/dL (ref >39.00)
LDL Cholesterol: 87 mg/dL (ref 0–99)
NonHDL: 117.56
Total CHOL/HDL Ratio: 4
Triglycerides: 153 mg/dL — ABNORMAL HIGH (ref 0.0–149.0)
VLDL: 30.6 mg/dL (ref 0.0–40.0)

## 2019-12-19 LAB — COMPREHENSIVE METABOLIC PANEL
ALT: 28 U/L (ref 0–53)
AST: 23 U/L (ref 0–37)
Albumin: 4.4 g/dL (ref 3.5–5.2)
Alkaline Phosphatase: 61 U/L (ref 39–117)
BUN: 19 mg/dL (ref 6–23)
CO2: 27 mEq/L (ref 19–32)
Calcium: 9.9 mg/dL (ref 8.4–10.5)
Chloride: 103 mEq/L (ref 96–112)
Creatinine, Ser: 1.17 mg/dL (ref 0.40–1.50)
GFR: 61.05 mL/min (ref >60.00)
Glucose, Bld: 84 mg/dL (ref 70–99)
Potassium: 3.9 mEq/L (ref 3.5–5.1)
Sodium: 138 mEq/L (ref 135–145)
Total Bilirubin: 1.1 mg/dL (ref 0.2–1.2)
Total Protein: 6.6 g/dL (ref 6.0–8.3)

## 2019-12-19 LAB — PSA, MEDICARE: PSA: 0.18 ng/ml (ref 0.10–4.00)

## 2019-12-19 LAB — TSH: TSH: 0.51 u[IU]/mL (ref 0.35–4.50)

## 2019-12-19 LAB — HEMOGLOBIN A1C: Hgb A1c MFr Bld: 6.1 % (ref 4.6–6.5)

## 2019-12-19 MED ORDER — TETANUS-DIPHTH-ACELL PERTUSSIS 5-2.5-18.5 LF-MCG/0.5 IM SUSP: 0.5000 mL | Freq: Once | INTRAMUSCULAR | 0 refills | Status: AC

## 2019-12-19 NOTE — Patient Instructions (Signed)
Nice to see you. We will get labs today and contact you with results. Please go to the pharmacy to get your tetanus vaccine as well as your Shingrix vaccine. Please start using your Advair 1 puff twice daily.  If your asthma is not improving please let us know.

## 2019-12-19 NOTE — Assessment & Plan Note (Signed)
Bothering him some more recently with the heat.  Discussed increasing to twice daily and he could decrease back to once daily once that heat improves.

## 2019-12-19 NOTE — Progress Notes (Signed)
Alexander Rumps, MD Phone: [phone]  Alexander Fitzgerald is a 74 y.o. male who presents today for CPE.  Diet: Weight watchers Exercise: Has been increasing his exercise as the weather has been improving Colonoscopy: 12/27/2014 with hyperplastic polyp.  10-year recall. Prostate cancer screening: Due Family history-  Prostate cancer: No  Colon cancer: No Vaccines-   Flu: Due  Tetanus: Due  Shingles: Needs a second dose  COVID19: Up-to-date  Pneumonia: Up-to-date Hep C Screening: Up-to-date Tobacco use: No Alcohol use: No Illicit Drug use: No Dentist: Yes Ophthalmology: Yes Asthma: Patient notes that he has been bothering his asthma more recently.  He has been using Advair 1 puff daily and albuterol 2 puffs daily.  He notes now that it is cooling off in the evenings he does okay when walking though when it is really humid he has trouble and has to take the albuterol.   Active Ambulatory Problems    Diagnosis Date Noted  . Hypertension 01/13/2011  . RBBB (right bundle branch block) 04/23/2011  . OSA on CPAP 04/15/2014  . Obesity (BMI 30-39.9) 04/15/2014  . Arthritis of left knee 08/13/2015  . Rosacea 11/27/2015  . GERD (gastroesophageal reflux disease) 11/27/2015  . Pure hypercholesterolemia 02/26/2016  . BPH (benign prostatic hyperplasia) 05/13/2016  . Routine general medical examination at a health care facility 05/13/2016  . Asthma 06/10/2016  . 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 Venous insufficiency 05/18/2017  . Degenerative arthritis of right knee 06/20/2017  . Preop examination 12/13/2017  . Ankle sprain 12/13/2017  . Coronary artery calcification seen on CAT scan 08/09/2018  . Allergic rhinitis 12/18/2018  . Floaters 12/18/2018  . Blepharitis of both eyes 10/12/2011  . Dry eye syndrome 10/12/2011  . Dry eyes, bilateral 09/29/2011  . Hyperkalemia 09/20/2017  . 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  Postoperative state 03/12/2019   Resolved Ambulatory Problems    Diagnosis Date Noted  . SOB (shortness of breath) 04/23/2011  . Pain of left great toe 09/29/2012  . Medicare annual wellness visit, subsequent 12/04/2012  . Asthma with acute exacerbation 12/04/2012  . Esophageal spasm 05/04/2013  . Cough 06/04/2013  . Dyspnea and respiratory abnormality 08/22/2013  . Gout 05/22/2015  . Acute medial meniscal tear 08/13/2015  . Abnormal TSH 11/27/2015  . Neck fullness 05/13/2016  . Acute bronchitis 06/10/2016  . URI (upper respiratory infection) 07/13/2016  . Benign skin cyst 09/16/2016  . Smoker 08/09/2018   Past Medical History:  Diagnosis Date  . Asthma   . Cancer ([place])   . Depression   . Esophageal reflux   . Gout   . Hiatal hernia   . Squamous cell carcinoma of skin 07/30/2015    Family History  Problem Relation Age of Onset  . Hypertension Mother   . Hyperlipidemia Mother   . Diabetes Mother   . Heart attack Father 59       MI  . Hypertension Father   . Cancer Sister        pancreatic    Social History   Socioeconomic History  . Marital status: Married    Spouse name: Not on file  . Number of children: Not on file  . Years of education: Not on file  . Highest education level: Not on file  Occupational History  . Not on file  Tobacco Use  . Smoking status: Former Smoker    Packs/day: 0.50    Years: 20.00    Pack years: 10.00  Types: Cigarettes    Quit date: 06/15/1999    Years since quitting: 20.5  . Smokeless tobacco: Never Used  Substance and Sexual Activity  . Alcohol use: Yes    Alcohol/week: 1.0 standard drink    Types: 1 Standard drinks or equivalent per week    Comment: rarely  . Drug use: No  . Sexual activity: Yes  Other Topics Concern  . Not on file  Social History Narrative  . Not on file   Social Determinants of Health   Financial Resource Strain:   . Difficulty of Paying Living Expenses: Not on file  Food Insecurity:   .  Worried About Charity fundraiser in the Last Year: Not on file  . Ran Out of Food in the Last Year: Not on file  Transportation Needs:   . Lack of Transportation (Medical): Not on file  . Lack of Transportation (Non-Medical): Not on file  Physical Activity:   . Days of Exercise per Week: Not on file  . Minutes of Exercise per Session: Not on file  Stress: No Stress Concern Present  . Feeling of Stress : Not at all  Social Connections:   . Frequency of Communication with Friends and Family: Not on file  . Frequency of Social Gatherings with Friends and Family: Not on file  . Attends Religious Services: Not on file  . Active Member of Clubs or Organizations: Not on file  . Attends Archivist Meetings: Not on file  . Marital Status: Not on file  Intimate Partner Violence:   . Fear of Current or Ex-Partner: Not on file  . Emotionally Abused: Not on file  . Physically Abused: Not on file  . Sexually Abused: Not on file    ROS  General:  Negative for nexplained weight loss, fever Skin: Positive for new or changing mole (patient saw dermatology recently for this), negative for sore that won't heal HEENT: Positive for trouble hearing (wears hearing aids), negative for trouble seeing, ringing in ears, mouth sores, hoarseness, change in voice, dysphagia. CV:  Negative for chest pain, dyspnea, edema, palpitations Resp: Negative for cough, dyspnea, hemoptysis GI: Negative for nausea, vomiting, diarrhea, constipation, abdominal pain, melena, hematochezia. GU: Negative for dysuria, incontinence, urinary hesitance, hematuria, vaginal or penile discharge, polyuria, sexual difficulty, lumps in testicle or breasts MSK: Negative for muscle cramps or aches, joint pain or swelling Neuro: Negative for headaches, weakness, numbness, dizziness, passing out/fainting Psych: Negative for depression, anxiety, memory problems  Objective  Physical Exam Vitals:   12/19/19 1055  BP: 115/70    Pulse: 88  Temp: 98.8 F (37.1 C)  SpO2: 97%    BP Readings from Last 3 Encounters:  12/19/19 115/70  05/19/18 110/78  12/16/17 124/72   Wt Readings from Last 3 Encounters:  12/19/19 216 lb 9.6 oz (98.2 kg)  12/18/18 197 lb (89.4 kg)  05/19/18 212 lb 4 oz (96.3 kg)    Physical Exam Constitutional:      General: He is not in acute distress.    Appearance: He is not diaphoretic.  HENT:     Head: Normocephalic and atraumatic.  Eyes:     Conjunctiva/sclera: Conjunctivae normal.     Pupils: Pupils are equal, round, and reactive to light.  Cardiovascular:     Rate and Rhythm: Normal rate and regular rhythm.     Heart sounds: Normal heart sounds.  Pulmonary:     Effort: Pulmonary effort is normal.     Breath sounds: Normal  breath sounds.  Abdominal:     General: Bowel sounds are normal. There is no distension.     Palpations: Abdomen is soft.     Tenderness: There is no abdominal tenderness. There is no guarding or rebound.  Musculoskeletal:     Right lower leg: No edema.     Left lower leg: No edema.  Lymphadenopathy:     Cervical: No cervical adenopathy.  Skin:    General: Skin is warm and dry.  Neurological:     Mental Status: He is alert.  Psychiatric:        Mood and Affect: Mood normal.      Assessment/Plan:   Routine general medical examination at a health care facility Physical exam completed.  Encouraged healthy diet and exercise.  PSA to be checked.  Tetanus vaccine sent to pharmacy to complete.  Encouraged him to complete his Shingrix vaccine at the pharmacy as well.  Flu vaccine given today.  Lab work as outlined below.  Asthma Bothering him some more recently with the heat.  Discussed increasing to twice daily and he could decrease back to once daily once that heat improves.   Orders Placed This Encounter  Procedures  . HgB A1c  . Comp Met (CMET)  . Lipid panel  . PSA, Medicare ( [facility] only)  . TSH    Meds ordered this encounter   Medications  . Tdap (BOOSTRIX) 5-2.5-18.5 LF-MCG/0.5 injection    Sig: Inject 0.5 mLs into the muscle once for 1 dose.    Dispense:  0.5 mL    Refill:  0    This visit occurred during the SARS-CoV-2 public health emergency.  Safety protocols were in place, including screening questions prior to the visit, additional usage of staff PPE, and extensive cleaning of exam room while observing appropriate contact time as indicated for disinfecting solutions.    Alexander Rumps, MD [place]

## 2019-12-19 NOTE — Assessment & Plan Note (Signed)
Physical exam completed.  Encouraged healthy diet and exercise.  PSA to be checked.  Tetanus vaccine sent to pharmacy to complete.  Encouraged him to complete his Shingrix vaccine at the pharmacy as well.  Flu vaccine given today.  Lab work as outlined below.

## 2019-12-20 ENCOUNTER — Encounter: Payer: Self-pay | Admitting: Dermatology

## 2019-12-21 ENCOUNTER — Other Ambulatory Visit: Payer: Self-pay | Admitting: Family Medicine

## 2020-01-10 ENCOUNTER — Ambulatory Visit (INDEPENDENT_AMBULATORY_CARE_PROVIDER_SITE_OTHER): Payer: Medicare HMO

## 2020-01-10 VITALS — Ht 69.0 in | Wt 212.0 lb

## 2020-01-10 DIAGNOSIS — Z0000 Encounter for general adult medical examination without abnormal findings: Secondary | ICD-10-CM

## 2020-01-10 NOTE — Patient Instructions (Addendum)
Alexander Fitzgerald , Thank you for taking time to come for your Medicare Wellness Visit. I appreciate your ongoing commitment to your health goals. Please review the following plan we discussed and let me know if I can assist you in the future.   These are the goals we discussed: Goals      Patient Stated   .  Weight 185lb-195lb (pt-stated)      I want to lose weight.  10,000 steps daily.        This is a list of the screening recommended for you and due dates:  Health Maintenance  Topic Date Due  . COVID-19 Vaccine (1) Never done  . Tetanus Vaccine  04/27/2019  . Colon Cancer Screening  12/26/2024  . Flu Shot  Completed  .  Hepatitis C: One time screening is recommended by Center for Disease Control  (CDC) for  adults born from [birth year] through 1964.   Completed  . Pneumonia vaccines  Completed    Immunizations Immunization History  Administered Date(s) Administered  . Fluad Quad(high Dose 73+) 01/01/2019, 12/19/2019  . Influenza Split 03/03/2013  . Influenza, High Dose Seasonal PF 02/08/2017, 02/13/2018, 01/01/2019  . Influenza,inj,Quad PF,6+ Mos 01/16/2014  . Influenza-Unspecified 01/25/2012, 02/18/2015, 02/01/2016, 02/08/2017  . Pneumococcal Conjugate-13 06/04/2013  . Pneumococcal Polysaccharide-23 10/25/2008, 05/05/2015  . Tdap 04/26/2009  . Zoster 03/03/2013  . Zoster Recombinat (Shingrix) 09/19/2017, 10/12/2017   Keep all routine maintenance appointments.   Follow up 06/17/20 @ 10:30  Advanced directives: End of life planning; Advance aging; Advanced directives discussed.  Copy of current HCPOA/Living Will requested.    Conditions/risks identified: none new.   Follow up in one year for your annual wellness visit.  Preventive Care 34 Years and Older, Male Preventive care refers to lifestyle choices and visits with your health care provider that can promote health and wellness. What does preventive care include?  A yearly physical exam. This is also called an annual  well check.  Dental exams once or twice a year.  Routine eye exams. Ask your health care provider how often you should have your eyes checked.  Personal lifestyle choices, including:  Daily care of your teeth and gums.  Regular physical activity.  Eating a healthy diet.  Avoiding tobacco and drug use.  Limiting alcohol use.  Practicing safe sex.  Taking low doses of aspirin every day.  Taking vitamin and mineral supplements as recommended by your health care provider. What happens during an annual well check? The services and screenings done by your health care provider during your annual well check will depend on your age, overall health, lifestyle risk factors, and family history of disease. Counseling  Your health care provider may ask you questions about your:  Alcohol use.  Tobacco use.  Drug use.  Emotional well-being.  Home and relationship well-being.  Sexual activity.  Eating habits.  History of falls.  Memory and ability to understand (cognition).  Work and work Statistician. Screening  You may have the following tests or measurements:  Height, weight, and BMI.  Blood pressure.  Lipid and cholesterol levels. These may be checked every 5 years, or more frequently if you are over 36 years old.  Skin check.  Lung cancer screening. You may have this screening every year starting at age 73 if you have a 30-pack-year history of smoking and currently smoke or have quit within the past 15 years.  Fecal occult blood test (FOBT) of the stool. You may have this test every year  starting at age 73.  Flexible sigmoidoscopy or colonoscopy. You may have a sigmoidoscopy every 5 years or a colonoscopy every 10 years starting at age 1.  Prostate cancer screening. Recommendations will vary depending on your family history and other risks.  Hepatitis C blood test.  Hepatitis B blood test.  Sexually transmitted disease (STD) testing.  Diabetes screening.  This is done by checking your blood sugar (glucose) after you have not eaten for a while (fasting). You may have this done every 1-3 years.  Abdominal aortic aneurysm (AAA) screening. You may need this if you are a current or former smoker.  Osteoporosis. You may be screened starting at age 73 if you are at high risk. Talk with your health care provider about your test results, treatment options, and if necessary, the need for more tests. Vaccines  Your health care provider may recommend certain vaccines, such as:  Influenza vaccine. This is recommended every year.  Tetanus, diphtheria, and acellular pertussis (Tdap, Td) vaccine. You may need a Td booster every 10 years.  Zoster vaccine. You may need this after age 73.  Pneumococcal 13-valent conjugate (PCV13) vaccine. One dose is recommended after age 73.  Pneumococcal polysaccharide (PPSV23) vaccine. One dose is recommended after age 73. Talk to your health care provider about which screenings and vaccines you need and how often you need them. This information is not intended to replace advice given to you by your health care provider. Make sure you discuss any questions you have with your health care provider. Document Released: 05/02/2015 Document Revised: 12/24/2015 Document Reviewed: 02/04/2015 Elsevier Interactive Patient Education  2017 [place] Prevention in the Home Falls can cause injuries. They can happen to people of all ages. There are many things you can do to make your home safe and to help prevent falls. What can I do on the outside of my home?  Regularly fix the edges of walkways and driveways and fix any cracks.  Remove anything that might make you trip as you walk through a door, such as a raised step or threshold.  Trim any bushes or trees on the path to your home.  Use bright outdoor lighting.  Clear any walking paths of anything that might make someone trip, such as rocks or tools.  Regularly  check to see if handrails are loose or broken. Make sure that both sides of any steps have handrails.  Any raised decks and porches should have guardrails on the edges.  Have any leaves, snow, or ice cleared regularly.  Use sand or salt on walking paths during winter.  Clean up any spills in your garage right away. This includes oil or grease spills. What can I do in the bathroom?  Use night lights.  Install grab bars by the toilet and in the tub and shower. Do not use towel bars as grab bars.  Use non-skid mats or decals in the tub or shower.  If you need to sit down in the shower, use a plastic, non-slip stool.  Keep the floor dry. Clean up any water that spills on the floor as soon as it happens.  Remove soap buildup in the tub or shower regularly.  Attach bath mats securely with double-sided non-slip rug tape.  Do not have throw rugs and other things on the floor that can make you trip. What can I do in the bedroom?  Use night lights.  Make sure that you have a light by your bed that is  easy to reach.  Do not use any sheets or blankets that are too big for your bed. They should not hang down onto the floor.  Have a firm chair that has side arms. You can use this for support while you get dressed.  Do not have throw rugs and other things on the floor that can make you trip. What can I do in the kitchen?  Clean up any spills right away.  Avoid walking on wet floors.  Keep items that you use a lot in easy-to-reach places.  If you need to reach something above you, use a strong step stool that has a grab bar.  Keep electrical cords out of the way.  Do not use floor polish or wax that makes floors slippery. If you must use wax, use non-skid floor wax.  Do not have throw rugs and other things on the floor that can make you trip. What can I do with my stairs?  Do not leave any items on the stairs.  Make sure that there are handrails on both sides of the stairs and  use them. Fix handrails that are broken or loose. Make sure that handrails are as long as the stairways.  Check any carpeting to make sure that it is firmly attached to the stairs. Fix any carpet that is loose or worn.  Avoid having throw rugs at the top or bottom of the stairs. If you do have throw rugs, attach them to the floor with carpet tape.  Make sure that you have a light switch at the top of the stairs and the bottom of the stairs. If you do not have them, ask someone to add them for you. What else can I do to help prevent falls?  Wear shoes that:  Do not have high heels.  Have rubber bottoms.  Are comfortable and fit you well.  Are closed at the toe. Do not wear sandals.  If you use a stepladder:  Make sure that it is fully opened. Do not climb a closed stepladder.  Make sure that both sides of the stepladder are locked into place.  Ask someone to hold it for you, if possible.  Clearly mark and make sure that you can see:  Any grab bars or handrails.  First and last steps.  Where the edge of each step is.  Use tools that help you move around (mobility aids) if they are needed. These include:  Canes.  Walkers.  Scooters.  Crutches.  Turn on the lights when you go into a dark area. Replace any light bulbs as soon as they burn out.  Set up your furniture so you have a clear path. Avoid moving your furniture around.  If any of your floors are uneven, fix them.  If there are any pets around you, be aware of where they are.  Review your medicines with your doctor. Some medicines can make you feel dizzy. This can increase your chance of falling. Ask your doctor what other things that you can do to help prevent falls. This information is not intended to replace advice given to you by your health care provider. Make sure you discuss any questions you have with your health care provider. Document Released: 01/30/2009 Document Revised: 09/11/2015 Document  Reviewed: 05/10/2014 Elsevier Interactive Patient Education  2017 [employer].

## 2020-01-10 NOTE — Progress Notes (Signed)
Subjective:   Alexander Fitzgerald is a 74 y.o. male who presents for Medicare Annual/Subsequent preventive examination.  Review of Systems    No ROS.  Medicare Wellness Virtual Visit.   Cardiac Risk Factors include: advanced age (>49men, >1 women);hypertension;male gender     Objective:    Today's Vitals   01/10/20 0933  Weight: 212 lb (96.2 kg)  Height: 5\' 9"  (1.753 m)   Body mass index is 31.31 kg/m.  Advanced Directives 01/10/2020 01/09/2019 12/16/2017 05/05/2015  Does Patient Have a Medical Advance Directive? Yes Yes Yes Yes  Type of Paramedic of [place];Living will [place];Living will [place];Living will [place];Living will  Does patient want to make changes to medical advance directive? No - Patient declined No - Patient declined No - Patient declined No - Patient declined  Copy of [place] in Chart? No - copy requested No - copy requested No - copy requested No - copy requested    Current Medications (verified) Outpatient Encounter Medications as of 01/10/2020  Medication Sig  . albuterol (VENTOLIN HFA) 108 (90 Base) MCG/ACT inhaler INHALE 2 PUFFS 4 TIMES A DAY AS NEEDED  . Ascorbic Acid (VITAMIN C) 1000 MG tablet Take 1,000 mg by mouth daily. Reported on 06/26/2015  . aspirin 81 MG chewable tablet Chew by mouth daily.  . Cholecalciferol (VITAMIN D3) 2000 units capsule Take 4,000 Units by mouth.  . Cyanocobalamin (VITAMIN B-12) 5000 MCG SUBL Place 2 each under the tongue daily.    . Dapsone (ACZONE) 5 % topical gel Aczone 5 % topical gel  APPLY A SMALL AMOUNT TO AFFECTED AREA EVERY MORNING  . docusate sodium (COLACE) 100 MG capsule Take 100 mg by mouth daily.  . finasteride (PROSCAR) 5 MG tablet TAKE 1 TABLET BY MOUTH EVERY DAY  . fluticasone (FLONASE) 50 MCG/ACT nasal spray SPRAY 2 SPRAYS INTO EACH NOSTRIL EVERY DAY  . Fluticasone-Salmeterol (ADVAIR DISKUS) 250-50  MCG/DOSE AEPB TAKE 1 PUFF BY MOUTH AS NEEDED  . levothyroxine (SYNTHROID) 112 MCG tablet TAKE 1TAB BY MOUTH ONCE DAILY ON EMPTY STOMACH WITH A GLASS OF WATER 30-60MIN BEFORE BREAKFAST  . lisinopril (ZESTRIL) 5 MG tablet TAKE 1 TABLET BY MOUTH EVERY DAY  . metroNIDAZOLE (METROGEL) 1 % gel metronidazole 1 % topical gel  APPLY TO AFFECTED AREA EVERY DAY  . montelukast (SINGULAIR) 10 MG tablet TAKE 1 TABLET BY MOUTH EVERYDAY AT BEDTIME  . Multiple Vitamin (MULTIVITAMIN) capsule Take 1 capsule by mouth daily.    . pantoprazole (PROTONIX) 40 MG tablet TAKE 1 TABLET BY MOUTH EVERY DAY BEFORE A MEAL AS DIRECTED  . Pyridoxine HCl (VITAMIN B-6 CR) 200 MG TBCR Take by mouth.    . torsemide (DEMADEX) 5 MG tablet TAKE 1 TABLET BY MOUTH EVERY DAY   No facility-administered encounter medications on file as of 01/10/2020.    Allergies (verified) Sulfa drugs cross reactors   History: Past Medical History:  Diagnosis Date  . Asthma    adult onset, PFTs done in [place]  . Cancer ([place])    Several moles removed in past, all pre-cancerous.  Recently followed by Dr. Nehemiah Massed  . Depression    Treated with Prozac in 1992,  Associate with job loss  . Esophageal reflux   . Gout   . Hiatal hernia   . Hypertension   . Squamous cell carcinoma of skin 07/30/2015   R anterior lat neck, Atypical squamous proliferation, excised 08/28/2015  Past Surgical History:  Procedure Laterality Date  . APPENDECTOMY    . COLONOSCOPY N/A 12/27/2014   Procedure: COLONOSCOPY;  Surgeon: Lollie Sails, MD;  [facility];  Service: Endoscopy;  Laterality: N/A;  . LAPAROSCOPIC PARAESOPHAGEAL HERNIA REPAIR    . TONSILLECTOMY     Family History  Problem Relation Age of Onset  . Hypertension Mother   . Hyperlipidemia Mother   . Diabetes Mother   . Heart attack Father 24       MI  . Hypertension Father   . Cancer Sister        pancreatic   Social History   Socioeconomic History  . Marital status: Married     Spouse name: Not on file  . Number of children: Not on file  . Years of education: Not on file  . Highest education level: Not on file  Occupational History  . Not on file  Tobacco Use  . Smoking status: Former Smoker    Packs/day: 0.50    Years: 20.00    Pack years: 10.00    Types: Cigarettes    Quit date: 06/15/1999    Years since quitting: 20.5  . Smokeless tobacco: Never Used  Substance and Sexual Activity  . Alcohol use: Yes    Alcohol/week: 1.0 standard drink    Types: 1 Standard drinks or equivalent per week    Comment: rarely  . Drug use: No  . Sexual activity: Yes  Other Topics Concern  . Not on file  Social History Narrative  . Not on file   Social Determinants of Health   Financial Resource Strain: Low Risk   . Difficulty of Paying Living Expenses: Not hard at all  Food Insecurity: No Food Insecurity  . Worried About Charity fundraiser in the Last Year: Never true  . Ran Out of Food in the Last Year: Never true  Transportation Needs: No Transportation Needs  . Lack of Transportation (Medical): No  . Lack of Transportation (Non-Medical): No  Physical Activity: Sufficiently Active  . Days of Exercise per Week: 7 days  . Minutes of Exercise per Session: 30 min  Stress: No Stress Concern Present  . Feeling of Stress : Not at all  Social Connections: Unknown  . Frequency of Communication with Friends and Family: More than three times a week  . Frequency of Social Gatherings with Friends and Family: More than three times a week  . Attends Religious Services: Not on file  . Active Member of Clubs or Organizations: Not on file  . Attends Archivist Meetings: Not on file  . Marital Status: Married    Tobacco Counseling Counseling given: Not Answered   Clinical Intake:  Pre-visit preparation completed: Yes        Diabetes: No  How often do you need to have someone help you when you read instructions, pamphlets, or other written materials  from your doctor or pharmacy?: 1 - Never Interpreter Needed?: No      Activities of Daily Living In your present state of health, do you have any difficulty performing the following activities: 01/10/2020  Hearing? N  Vision? N  Difficulty concentrating or making decisions? N  Walking or climbing stairs? N  Dressing or bathing? N  Doing errands, shopping? N  Preparing Food and eating ? N  Using the Toilet? N  In the past six months, have you accidently leaked urine? N  Do you have problems with loss of bowel control?  N  Managing your Medications? N  Managing your Finances? N  Housekeeping or managing your Housekeeping? N  Some recent data might be hidden    Patient Care Team: Leone Haven, MD as PCP - General (Family Medicine) Rockey Situ Kathlene November, MD as Consulting Physician (Cardiology)  Indicate any recent Medical Services you may have received from other than [facility] providers in the past year (date may be approximate).     Assessment:   This is a routine wellness examination for [place].  I connected with [place] today by telephone and verified that I am speaking with the correct person using two identifiers. Location patient: home Location provider: work Persons participating in the virtual visit: patient, Marine scientist.    I discussed the limitations, risks, security and privacy concerns of performing an evaluation and management service by telephone and the availability of in person appointments. The patient expressed understanding and verbally consented to this telephonic visit.    Interactive audio and video telecommunications were attempted between this provider and patient, however failed, due to patient having technical difficulties OR patient did not have access to video capability.  We continued and completed visit with audio only.  Some vital signs may be absent or patient reported.   Hearing/Vision screen  Hearing Screening   125Hz  250Hz  500Hz  1000Hz  2000Hz   3000Hz  4000Hz  6000Hz  8000Hz   Right ear:           Left ear:           Comments: Patient is able to hear conversational tones without difficulty.  No issues reported.  Vision Screening Comments: Wears corrective lenses Visual acuity not assessed, virtual visit.  They have seen their ophthalmologist in the last 12 months.    Dietary issues and exercise activities discussed: Current Exercise Habits: Home exercise routine, Type of exercise: walking, Time (Minutes): 30, Frequency (Times/Week): 7, Weekly Exercise (Minutes/Week): 210, Intensity: Mild  Weight watchers diet  Goals      Patient Stated   .  Weight 185lb-195lb (pt-stated)      I want to lose weight.  10,000 steps daily.       Depression Screen PHQ 2/9 Scores 01/10/2020 12/19/2019 01/09/2019 12/18/2018 12/16/2017 05/13/2016 05/05/2015  PHQ - 2 Score 0 0 0 0 0 0 0    Fall Risk Fall Risk  01/10/2020 12/19/2019 01/09/2019 12/18/2018 12/16/2017  Falls in the past year? 0 0 0 0 No  Number falls in past yr: 0 0 - 0 -  Follow up Falls evaluation completed Falls evaluation completed - Falls evaluation completed -   Handrails in use when climbing stairs? Yes Home free of loose throw rugs in walkways, pet beds, electrical cords, etc? Yes  Adequate lighting in your home to reduce risk of falls? Yes   ASSISTIVE DEVICES UTILIZED TO PREVENT FALLS: Life alert? No  Use of a cane, walker or w/c? No   TIMED UP AND GO: Was the test performed? No . Virtual visit.   Cognitive Function: MMSE - Mini Mental State Exam 12/16/2017 05/05/2015  Orientation to time 5 5  Orientation to Place 5 5  Registration 3 3  Attention/ Calculation 5 5  Recall 3 3  Language- name 2 objects 2 2  Language- repeat 1 1  Language- follow 3 step command 3 3  Language- read & follow direction 1 1  Write a sentence 1 1  Copy design 1 1  Total score 30 30     6CIT Screen 01/09/2019  What Year? 0 points  What month? 0 points  What time? 0 points  Count back from 20  0 points  Months in reverse 0 points  Repeat phrase 0 points  Total Score 0    Immunizations Immunization History  Administered Date(s) Administered  . Fluad Quad(high Dose 65+) 01/01/2019, 12/19/2019  . Influenza Split 03/03/2013  . Influenza, High Dose Seasonal PF 02/08/2017, 02/13/2018, 01/01/2019  . Influenza,inj,Quad PF,6+ Mos 01/16/2014  . Influenza-Unspecified 01/25/2012, 02/18/2015, 02/01/2016, 02/08/2017  . Pneumococcal Conjugate-13 06/04/2013  . Pneumococcal Polysaccharide-23 10/25/2008, 05/05/2015  . Tdap 04/26/2009  . Zoster 03/03/2013  . Zoster Recombinat (Shingrix) 09/19/2017, 10/12/2017    TDAP status: Due, Education has been provided regarding the importance of this vaccine. Advised may receive this vaccine at local pharmacy or Health Dept. Aware to provide a copy of the vaccination record if obtained from local pharmacy or Health Dept. Verbalized acceptance and understanding. Deferred.   Health Maintenance Health Maintenance  Topic Date Due  . COVID-19 Vaccine (1) Never done  . TETANUS/TDAP  04/27/2019  . COLONOSCOPY  12/26/2024  . INFLUENZA VACCINE  Completed  . Hepatitis C Screening  Completed  . PNA vac Low Risk Adult  Completed    Dental Screening: Recommended annual dental exams for proper oral hygiene  Community Resource Referral / Chronic Care Management: CRR required this visit?  No   CCM required this visit?  No      Plan:   Keep all routine maintenance appointments.   Follow up 06/17/20 @ 10:30  I have personally reviewed and noted the following in the patient's chart:   . Medical and social history . Use of alcohol, tobacco or illicit drugs  . Current medications and supplements . Functional ability and status . Nutritional status . Physical activity . Advanced directives . List of other physicians . Hospitalizations, surgeries, and ER visits in previous 12 months . Vitals . Screenings to include cognitive, depression, and  falls . Referrals and appointments  In addition, I have reviewed and discussed with patient certain preventive protocols, quality metrics, and best practice recommendations. A written personalized care plan for preventive services as well as general preventive health recommendations were provided to patient via mychart.     Varney Biles, LPN   5/46/2703

## 2020-01-18 ENCOUNTER — Other Ambulatory Visit: Payer: Self-pay | Admitting: Family Medicine

## 2020-02-01 ENCOUNTER — Other Ambulatory Visit: Payer: Self-pay | Admitting: Dermatology

## 2020-02-05 ENCOUNTER — Other Ambulatory Visit: Payer: Self-pay | Admitting: Dermatology

## 2020-02-06 ENCOUNTER — Other Ambulatory Visit: Payer: Self-pay

## 2020-02-06 ENCOUNTER — Encounter: Payer: Self-pay | Admitting: Dermatology

## 2020-02-06 MED ORDER — DOXYCYCLINE HYCLATE 50 MG PO CAPS: 50.0000 mg | ORAL_CAPSULE | Freq: Every day | ORAL | 2 refills | Status: DC

## 2020-02-24 ENCOUNTER — Encounter: Payer: Self-pay | Admitting: Family Medicine

## 2020-02-25 ENCOUNTER — Telehealth: Payer: Self-pay | Admitting: Family Medicine

## 2020-02-25 NOTE — Telephone Encounter (Signed)
Faxed as requested. Through Epic.

## 2020-02-25 NOTE — Telephone Encounter (Signed)
Pt needs a copy of his last office note with Dr. Caryl Bis sent to Dr. Renee Rival at [facility] fax # [phone]

## 2020-03-03 ENCOUNTER — Encounter: Payer: Self-pay | Admitting: Family Medicine

## 2020-03-03 ENCOUNTER — Ambulatory Visit (INDEPENDENT_AMBULATORY_CARE_PROVIDER_SITE_OTHER): Payer: Medicare HMO | Admitting: Family Medicine

## 2020-03-03 ENCOUNTER — Other Ambulatory Visit: Payer: Self-pay

## 2020-03-03 VITALS — BP 125/70 | HR 90 | Temp 98.8°F | Ht 69.0 in | Wt 216.1 lb

## 2020-03-03 DIAGNOSIS — K9049 Malabsorption due to intolerance, not elsewhere classified: Secondary | ICD-10-CM

## 2020-03-03 DIAGNOSIS — R1084 Generalized abdominal pain: Secondary | ICD-10-CM

## 2020-03-03 NOTE — Patient Instructions (Signed)
Nice to see you. We will get labs and contact you with the results. Please try to avoid the foods you eat the most on the list provided.  You can try adding them back one by one to see if there are particular foods that bother your stomach.   Low-FODMAP Eating Plan  FODMAPs (fermentable oligosaccharides, disaccharides, monosaccharides, and polyols) are sugars that are hard for some people to digest. A low-FODMAP eating plan may help some people who have bowel (intestinal) diseases to manage their symptoms. This meal plan can be complicated to follow. Work with a diet and nutrition specialist (dietitian) to make a low-FODMAP eating plan that is right for you. A dietitian can make sure that you get enough nutrition from this diet. What are tips for following this plan? Reading food labels  Check labels for hidden FODMAPs such as: ? High-fructose syrup. ? Honey. ? Agave. ? Natural fruit flavors. ? Onion or garlic powder.  Choose low-FODMAP foods that contain 3-4 grams of fiber per serving.  Check food labels for serving sizes. Eat only one serving at a time to make sure FODMAP levels stay low. Meal planning  Follow a low-FODMAP eating plan for up to 6 weeks, or as told by your health care provider or dietitian.  To follow the eating plan: 1. Eliminate high-FODMAP foods from your diet completely. 2. Gradually reintroduce high-FODMAP foods into your diet one at a time. Most people should wait a few days after introducing one high-FODMAP food before they introduce the next high-FODMAP food. Your dietitian can recommend how quickly you may reintroduce foods. 3. Keep a daily record of what you eat and drink, and make note of any symptoms that you have after eating. 4. Review your daily record with a dietitian regularly. Your dietitian can help you identify which foods you can eat and which foods you should avoid. General tips  Drink enough fluid each day to keep your urine pale  yellow.  Avoid processed foods. These often have added sugar and may be high in FODMAPs.  Avoid most dairy products, whole grains, and sweeteners.  Work with a dietitian to make sure you get enough fiber in your diet. Recommended foods Grains  Gluten-free grains, such as rice, oats, buckwheat, quinoa, corn, polenta, and millet. Gluten-free pasta, bread, or cereal. Rice noodles. Corn tortillas. Vegetables  Eggplant, zucchini, cucumber, peppers, green beans, Brussels sprouts, bean sprouts, lettuce, arugula, kale, Swiss chard, spinach, collard greens, bok choy, summer squash, potato, and tomato. Limited amounts of corn, carrot, and sweet potato. Green parts of scallions. Fruits  Bananas, oranges, lemons, limes, blueberries, raspberries, strawberries, grapes, cantaloupe, honeydew melon, kiwi, papaya, passion fruit, and pineapple. Limited amounts of dried cranberries, banana chips, and shredded coconut. Dairy  Lactose-free milk, yogurt, and kefir. Lactose-free cottage cheese and ice cream. Non-dairy milks, such as almond, coconut, hemp, and rice milk. Yogurts made of non-dairy milks. Limited amounts of goat cheese, brie, mozzarella, parmesan, swiss, and other hard cheeses. Meats and other protein foods  Unseasoned beef, pork, poultry, or fish. Eggs. Berniece Salines. Tofu (firm) and tempeh. Limited amounts of nuts and seeds, such as almonds, walnuts, [place] nuts, pecans, peanuts, pumpkin seeds, chia seeds, and sunflower seeds. Fats and oils  Butter-free spreads. Vegetable oils, such as olive, canola, and sunflower oil. Seasoning and other foods  Artificial sweeteners with names that do not end in "ol" such as aspartame, saccharine, and stevia. Maple syrup, white table sugar, raw sugar, brown sugar, and molasses. Fresh basil, coriander, parsley,  rosemary, and thyme. Beverages  Water and mineral water. Sugar-sweetened soft drinks. Small amounts of orange juice or cranberry juice. Black and green tea.  Most dry wines. Coffee. This may not be a complete list of low-FODMAP foods. Talk with your dietitian for more information. Foods to avoid Grains  Wheat, including kamut, durum, and semolina. Barley and bulgur. Couscous. Wheat-based cereals. Wheat noodles, bread, crackers, and pastries. Vegetables  Chicory root, artichoke, asparagus, cabbage, snow peas, sugar snap peas, mushrooms, and cauliflower. Onions, garlic, leeks, and the white part of scallions. Fruits  Fresh, dried, and juiced forms of apple, pear, watermelon, peach, plum, cherries, apricots, blackberries, boysenberries, figs, nectarines, and mango. Avocado. Dairy  Milk, yogurt, ice cream, and soft cheese. Cream and sour cream. Milk-based sauces. Custard. Meats and other protein foods  Fried or fatty meat. Sausage. Cashews and pistachios. Soybeans, baked beans, black beans, chickpeas, kidney beans, fava beans, navy beans, lentils, and split peas. Seasoning and other foods  Any sugar-free gum or candy. Foods that contain artificial sweeteners such as sorbitol, mannitol, isomalt, or xylitol. Foods that contain honey, high-fructose corn syrup, or agave. Bouillon, vegetable stock, beef stock, and chicken stock. Garlic and onion powder. Condiments made with onion, such as hummus, chutney, pickles, relish, salad dressing, and salsa. Tomato paste. Beverages  Chicory-based drinks. Coffee substitutes. Chamomile tea. Fennel tea. Sweet or fortified wines such as port or sherry. Diet soft drinks made with isomalt, mannitol, maltitol, sorbitol, or xylitol. Apple, pear, and mango juice. Juices with high-fructose corn syrup. This may not be a complete list of high-FODMAP foods. Talk with your dietitian to discuss what dietary choices are best for you.  Summary  A low-FODMAP eating plan is a short-term diet that eliminates FODMAPs from your diet to help ease symptoms of certain bowel diseases.  The eating plan usually lasts up to 6 weeks. After  that, high-FODMAP foods are restarted gradually, one at a time, so you can find out which may be causing symptoms.  A low-FODMAP eating plan can be complicated. It is best to work with a dietitian who has experience with this type of plan. This information is not intended to replace advice given to you by your health care provider. Make sure you discuss any questions you have with your health care provider. Document Revised: 03/18/2017 Document Reviewed: 11/30/2016 Elsevier Patient Education  [place].

## 2020-03-04 ENCOUNTER — Other Ambulatory Visit: Payer: Self-pay | Admitting: Family Medicine

## 2020-03-04 DIAGNOSIS — N179 Acute kidney failure, unspecified: Secondary | ICD-10-CM

## 2020-03-04 LAB — COMPREHENSIVE METABOLIC PANEL
ALT: 26 U/L (ref 0–53)
AST: 23 U/L (ref 0–37)
Albumin: 4.2 g/dL (ref 3.5–5.2)
Alkaline Phosphatase: 68 U/L (ref 39–117)
BUN: 24 mg/dL — ABNORMAL HIGH (ref 6–23)
CO2: 28 mEq/L (ref 19–32)
Calcium: 9.5 mg/dL (ref 8.4–10.5)
Chloride: 102 mEq/L (ref 96–112)
Creatinine, Ser: 1.32 mg/dL (ref 0.40–1.50)
GFR: 53.49 mL/min — ABNORMAL LOW (ref >60.00)
Glucose, Bld: 86 mg/dL (ref 70–99)
Potassium: 4.1 mEq/L (ref 3.5–5.1)
Sodium: 138 mEq/L (ref 135–145)
Total Bilirubin: 0.6 mg/dL (ref 0.2–1.2)
Total Protein: 6.5 g/dL (ref 6.0–8.3)

## 2020-03-06 DIAGNOSIS — K9049 Malabsorption due to intolerance, not elsewhere classified: Secondary | ICD-10-CM | POA: Insufficient documentation

## 2020-03-06 NOTE — Assessment & Plan Note (Signed)
Suspect this is related to food intolerance. He was given a FODMAP diet. He can continue as needed simethicone. If it worsens again he will let us know. We will check liver function tests to help rule that out as a cause of his abdominal discomfort.

## 2020-03-06 NOTE — Progress Notes (Signed)
  [name], MD Phone: [phone]  Alexander Fitzgerald is a 74 y.o. male who presents today for same-day visit.  Flatulence: Patient reports that 2 months ago he changed his diet. He was drinking lots of soda on a trip and eating lots of cabbage. He developed lots of gas and rumbling in his stomach. He then developed some constipation and then that improved. He developed some scattered abdominal discomfort that would get up to a 7-8/10. Notes it came and went for about 6 or 7 weeks. He noted no nausea, vomiting, diarrhea, or blood in stool. Some constipation initially. He started on simethicone and that was quite helpful. He has not had any pain since then. He has tried to alter his diet recently with stopping carbonated sodas and avoiding gas producing foods.  Social History   Tobacco Use  Smoking Status Former Smoker  . Packs/day: 0.50  . Years: 20.00  . Pack years: 10.00  . Types: Cigarettes  . Quit date: 06/15/1999  . Years since quitting: 20.7  Smokeless Tobacco Never Used     ROS see history of present illness  Objective  Physical Exam Vitals:   03/03/20 1537  BP: 125/70  Pulse: 90  Temp: 98.8 F (37.1 C)  SpO2: 97%    BP Readings from Last 3 Encounters:  03/03/20 125/70  12/19/19 115/70  05/19/18 110/78   Wt Readings from Last 3 Encounters:  03/03/20 216 lb 1.9 oz (98 kg)  01/10/20 212 lb (96.2 kg)  12/19/19 216 lb 9.6 oz (98.2 kg)    Physical Exam Abdominal:     General: Bowel sounds are normal. There is no distension.     Palpations: Abdomen is soft.     Tenderness: There is no abdominal tenderness. There is no guarding or rebound.      Assessment/Plan: Please see individual problem list.  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 He was given a FODMAP diet. He can continue as needed simethicone. If it worsens again he will let us know. We will check liver  function tests to help rule that out as a cause of his abdominal discomfort.       Other Visit Diagnoses    Generalized abdominal pain    -  Primary   Relevant Orders   Comp Met (CMET) (Completed)      This visit occurred during the SARS-CoV-2 public health emergency.  Safety protocols were in place, including screening questions prior to the visit, additional usage of staff PPE, and extensive cleaning of exam room while observing appropriate contact time as indicated for disinfecting solutions.    [name], MD [place]

## 2020-03-09 ENCOUNTER — Other Ambulatory Visit: Payer: Self-pay | Admitting: Family Medicine

## 2020-03-11 ENCOUNTER — Other Ambulatory Visit: Payer: Self-pay

## 2020-03-11 ENCOUNTER — Other Ambulatory Visit (INDEPENDENT_AMBULATORY_CARE_PROVIDER_SITE_OTHER): Payer: Medicare HMO

## 2020-03-11 DIAGNOSIS — N179 Acute kidney failure, unspecified: Secondary | ICD-10-CM

## 2020-03-12 ENCOUNTER — Other Ambulatory Visit: Payer: Self-pay | Admitting: Surgery

## 2020-03-12 DIAGNOSIS — R109 Unspecified abdominal pain: Secondary | ICD-10-CM

## 2020-03-12 LAB — BASIC METABOLIC PANEL
BUN: 20 mg/dL (ref 6–23)
CO2: 29 mEq/L (ref 19–32)
Calcium: 9.6 mg/dL (ref 8.4–10.5)
Chloride: 103 mEq/L (ref 96–112)
Creatinine, Ser: 1.32 mg/dL (ref 0.40–1.50)
GFR: 53.48 mL/min — ABNORMAL LOW (ref >60.00)
Glucose, Bld: 97 mg/dL (ref 70–99)
Potassium: 4.4 mEq/L (ref 3.5–5.1)
Sodium: 139 mEq/L (ref 135–145)

## 2020-03-16 DIAGNOSIS — G4733 Obstructive sleep apnea (adult) (pediatric): Secondary | ICD-10-CM | POA: Diagnosis not present

## 2020-03-17 ENCOUNTER — Other Ambulatory Visit (INDEPENDENT_AMBULATORY_CARE_PROVIDER_SITE_OTHER): Payer: Medicare HMO

## 2020-03-17 ENCOUNTER — Other Ambulatory Visit: Payer: Medicare HMO

## 2020-03-17 ENCOUNTER — Other Ambulatory Visit: Payer: Self-pay

## 2020-03-17 ENCOUNTER — Telehealth: Payer: Self-pay

## 2020-03-17 DIAGNOSIS — N179 Acute kidney failure, unspecified: Secondary | ICD-10-CM

## 2020-03-17 LAB — CBC
HCT: 47.5 % (ref 39.0–52.0)
Hemoglobin: 15.9 g/dL (ref 13.0–17.0)
MCHC: 33.6 g/dL (ref 30.0–36.0)
MCV: 85.2 fl (ref 78.0–100.0)
Platelets: 222 10*3/uL (ref 150.0–400.0)
RBC: 5.57 Mil/uL (ref 4.22–5.81)
RDW: 13.9 % (ref 11.5–15.5)
WBC: 7.1 10*3/uL (ref 4.0–10.5)

## 2020-03-17 NOTE — Telephone Encounter (Signed)
-----   Message from Leone Haven, MD sent at 03/12/2020 12:39 PM EST ----- Please let the patient know that his kidney function is the same as it was previously.  Has he increase his water intake?  Has he been taking any anti-inflammatory such as ibuprofen or Aleve?  Has he started any new supplements?  Thanks.

## 2020-03-17 NOTE — Addendum Note (Signed)
Addended by: Fulton Mole D on: 03/17/2020 09:18 AM   Modules accepted: Orders

## 2020-03-17 NOTE — Telephone Encounter (Signed)
I called and informed the patient of his lab results and he understood.  Patient stated he was taking aleve I informe dhim to go to tylenol.  He is not taking a supplement.  Patient has increased his water intake.  Patient stated he needed a CBC this came from his referral doctor.  I ordered the CBC and e will come in today to do the lab work.  Angie Hogg,cma

## 2020-03-20 ENCOUNTER — Ambulatory Visit
Admission: RE | Admit: 2020-03-20 | Discharge: 2020-03-20 | Disposition: A | Discharge status: Home / Self Care | Payer: Medicare HMO | Source: Ambulatory Visit | Attending: Surgery | Admitting: Surgery

## 2020-03-20 ENCOUNTER — Other Ambulatory Visit: Payer: Self-pay

## 2020-03-20 DIAGNOSIS — R109 Unspecified abdominal pain: Secondary | ICD-10-CM | POA: Insufficient documentation

## 2020-03-20 DIAGNOSIS — K429 Umbilical hernia without obstruction or gangrene: Secondary | ICD-10-CM | POA: Diagnosis not present

## 2020-03-20 DIAGNOSIS — K5730 Diverticulosis of large intestine without perforation or abscess without bleeding: Secondary | ICD-10-CM | POA: Diagnosis not present

## 2020-03-20 DIAGNOSIS — I700 Atherosclerosis of aorta: Secondary | ICD-10-CM | POA: Diagnosis not present

## 2020-03-20 DIAGNOSIS — K5900 Constipation, unspecified: Secondary | ICD-10-CM | POA: Diagnosis not present

## 2020-03-24 DIAGNOSIS — R109 Unspecified abdominal pain: Secondary | ICD-10-CM | POA: Diagnosis not present

## 2020-03-28 ENCOUNTER — Ambulatory Visit: Payer: Medicare HMO | Admitting: Family Medicine

## 2020-03-31 ENCOUNTER — Other Ambulatory Visit: Payer: Self-pay | Admitting: Family Medicine

## 2020-04-09 DIAGNOSIS — K429 Umbilical hernia without obstruction or gangrene: Secondary | ICD-10-CM | POA: Diagnosis not present

## 2020-05-06 DIAGNOSIS — Z87891 Personal history of nicotine dependence: Secondary | ICD-10-CM | POA: Diagnosis not present

## 2020-05-06 DIAGNOSIS — K439 Ventral hernia without obstruction or gangrene: Secondary | ICD-10-CM | POA: Diagnosis not present

## 2020-05-06 DIAGNOSIS — J45909 Unspecified asthma, uncomplicated: Secondary | ICD-10-CM | POA: Diagnosis not present

## 2020-05-06 DIAGNOSIS — K429 Umbilical hernia without obstruction or gangrene: Secondary | ICD-10-CM | POA: Diagnosis not present

## 2020-05-06 DIAGNOSIS — I10 Essential (primary) hypertension: Secondary | ICD-10-CM | POA: Diagnosis not present

## 2020-05-06 DIAGNOSIS — G4733 Obstructive sleep apnea (adult) (pediatric): Secondary | ICD-10-CM | POA: Diagnosis not present

## 2020-05-14 DIAGNOSIS — Z0100 Encounter for examination of eyes and vision without abnormal findings: Secondary | ICD-10-CM | POA: Diagnosis not present

## 2020-05-17 ENCOUNTER — Other Ambulatory Visit: Payer: Self-pay | Admitting: Family Medicine

## 2020-06-07 ENCOUNTER — Other Ambulatory Visit: Payer: Self-pay | Admitting: Family Medicine

## 2020-06-11 ENCOUNTER — Ambulatory Visit: Payer: Medicare HMO | Admitting: Dermatology

## 2020-06-15 ENCOUNTER — Other Ambulatory Visit: Payer: Self-pay | Admitting: Family Medicine

## 2020-06-16 DIAGNOSIS — G4733 Obstructive sleep apnea (adult) (pediatric): Secondary | ICD-10-CM | POA: Diagnosis not present

## 2020-06-17 ENCOUNTER — Ambulatory Visit: Payer: Medicare HMO | Admitting: Family Medicine

## 2020-06-25 ENCOUNTER — Other Ambulatory Visit: Payer: Self-pay

## 2020-06-27 ENCOUNTER — Other Ambulatory Visit: Payer: Self-pay

## 2020-06-27 ENCOUNTER — Encounter: Payer: Self-pay | Admitting: Family Medicine

## 2020-06-27 ENCOUNTER — Ambulatory Visit (INDEPENDENT_AMBULATORY_CARE_PROVIDER_SITE_OTHER): Payer: Medicare HMO | Admitting: Family Medicine

## 2020-06-27 DIAGNOSIS — Z9889 Other specified postprocedural states: Secondary | ICD-10-CM | POA: Diagnosis not present

## 2020-06-27 DIAGNOSIS — Z8719 Personal history of other diseases of the digestive system: Secondary | ICD-10-CM

## 2020-06-27 DIAGNOSIS — I10 Essential (primary) hypertension: Secondary | ICD-10-CM | POA: Diagnosis not present

## 2020-06-27 DIAGNOSIS — I872 Venous insufficiency (chronic) (peripheral): Secondary | ICD-10-CM

## 2020-06-27 DIAGNOSIS — J4530 Mild persistent asthma, uncomplicated: Secondary | ICD-10-CM | POA: Diagnosis not present

## 2020-06-27 DIAGNOSIS — E890 Postprocedural hypothyroidism: Secondary | ICD-10-CM

## 2020-06-27 LAB — BASIC METABOLIC PANEL
BUN: 16 mg/dL (ref 6–23)
CO2: 27 mEq/L (ref 19–32)
Calcium: 9.3 mg/dL (ref 8.4–10.5)
Chloride: 106 mEq/L (ref 96–112)
Creatinine, Ser: 1.14 mg/dL (ref 0.40–1.50)
GFR: 63.64 mL/min (ref >60.00)
Glucose, Bld: 108 mg/dL — ABNORMAL HIGH (ref 70–99)
Potassium: 4.4 mEq/L (ref 3.5–5.1)
Sodium: 140 mEq/L (ref 135–145)

## 2020-06-27 LAB — TSH: TSH: 0.64 u[IU]/mL (ref 0.35–4.50)

## 2020-06-27 NOTE — Progress Notes (Signed)
[name], MD Phone: [phone]  Alexander Fitzgerald is a 75 y.o. male who presents today for f/u.  HYPERTENSION  Disease Monitoring  Home BP Monitoring similar to today Chest pain- no    Dyspnea- see asthma Medications  Compliance-  Taking lisinopril, torsemide.   Edema- chronic and stable  HYPOTHYROIDISM Disease Monitoring Weight changes: no  Skin Changes: no Heat/Cold intolerance: no  Medication Monitoring Compliance:  Taking synthroid   Last TSH:   Lab Results  Component Value Date   TSH 0.51 12/19/2019   Asthma: Patient notes he had a little bit of worsening of this prior to a hernia repair though after he had the anesthesia it was quite a bit worse.  He increase his Advair to twice daily with some benefit.  He would get some cough with talking excessively.  Some mild dyspnea on exertion.  Has started to improve and he is cut his Advair back to once a day in the last several days.  Umbilical hernia: Status post repair.  Doing well.  He did have significant pain in the postop timeframe though that improved once he got home and slept.  Really only took Tylenol for the pain.   Social History   Tobacco Use  Smoking Status Former Smoker  . Packs/day: 0.50  . Years: 20.00  . Pack years: 10.00  . Types: Cigarettes  . Quit date: 06/15/1999  . Years since quitting: 21.0  Smokeless Tobacco Never Used    Current Outpatient Medications on File Prior to Visit  Medication Sig Dispense Refill  . albuterol (VENTOLIN HFA) 108 (90 Base) MCG/ACT inhaler INHALE 2 PUFFS 4 TIMES A DAY AS NEEDED 8.5 each 2  . Ascorbic Acid (VITAMIN C) 1000 MG tablet Take 1,000 mg by mouth daily. Reported on 06/26/2015    . aspirin 81 MG chewable tablet Chew by mouth daily.    . Cholecalciferol (VITAMIN D3) 2000 units capsule Take 4,000 Units by mouth.    . Cyanocobalamin (VITAMIN B-12) 5000 MCG SUBL Place 2 each under the tongue daily.    . Dapsone 5 % topical gel Aczone 5 % topical gel  APPLY A  SMALL AMOUNT TO AFFECTED AREA EVERY MORNING    . docusate sodium (COLACE) 100 MG capsule Take 100 mg by mouth daily.    Marland Kitchen doxycycline (VIBRAMYCIN) 50 MG capsule TAKE 1 CAPSULE BY MOUTH EVERY DAY WITH FOOD 90 capsule 3  . doxycycline (VIBRAMYCIN) 50 MG capsule Take 1 capsule (50 mg total) by mouth daily. 90 capsule 2  . finasteride (PROSCAR) 5 MG tablet TAKE 1 TABLET BY MOUTH EVERY DAY 90 tablet 1  . fluticasone (FLONASE) 50 MCG/ACT nasal spray SPRAY 2 SPRAYS INTO EACH NOSTRIL EVERY DAY 48 mL 2  . Fluticasone-Salmeterol (ADVAIR DISKUS) 250-50 MCG/DOSE AEPB TAKE 1 PUFF BY MOUTH AS NEEDED 60 each 3  . levothyroxine (SYNTHROID) 112 MCG tablet TAKE 1TAB BY MOUTH ONCE DAILY ON EMPTY STOMACH WITH A GLASS OF WATER 30-60MIN BEFORE BREAKFAST 90 tablet 3  . lisinopril (ZESTRIL) 5 MG tablet TAKE 1 TABLET BY MOUTH EVERY DAY 90 tablet 1  . metroNIDAZOLE (METROGEL) 1 % gel metronidazole 1 % topical gel  APPLY TO AFFECTED AREA EVERY DAY    . montelukast (SINGULAIR) 10 MG tablet TAKE 1 TABLET BY MOUTH EVERYDAY AT BEDTIME 90 tablet 1  . Multiple Vitamin (MULTIVITAMIN) capsule Take 1 capsule by mouth daily.    . pantoprazole (PROTONIX) 40 MG tablet TAKE 1 TABLET BY MOUTH EVERY DAY BEFORE A  MEAL AS DIRECTED 90 tablet 3  . Pyridoxine HCl 200 MG TBCR Take by mouth.    . torsemide (DEMADEX) 5 MG tablet TAKE 1 TABLET BY MOUTH EVERY DAY 90 tablet 1   No current facility-administered medications on file prior to visit.     ROS see history of present illness  Objective  Physical Exam Vitals:   06/27/20 1034  BP: 120/80  Pulse: 84  Temp: 98.5 F (36.9 C)  SpO2: 97%    BP Readings from Last 3 Encounters:  06/27/20 120/80  03/03/20 125/70  12/19/19 115/70   Wt Readings from Last 3 Encounters:  06/27/20 214 lb 12.8 oz (97.4 kg)  03/03/20 216 lb 1.9 oz (98 kg)  01/10/20 212 lb (96.2 kg)    Physical Exam Constitutional:      General: He is not in acute distress.    Appearance: He is not diaphoretic.   Cardiovascular:     Rate and Rhythm: Normal rate and regular rhythm.     Heart sounds: Normal heart sounds.  Pulmonary:     Effort: Pulmonary effort is normal.     Breath sounds: Normal breath sounds.  Musculoskeletal:     Right lower leg: No edema.     Left lower leg: No edema.  Skin:    General: Skin is warm and dry.  Neurological:     Mental Status: He is alert.      Assessment/Plan: Please see individual problem list.  Problem List Items Addressed This Visit    Asthma    Worsened recently with anesthesia.  Has started to improve the further out he is gotten.  He can continue Advair on a daily basis and albuterol on an as-needed basis.  He will let us know if he worsens again.      H/O hernia repair    Reports he is doing well.  He will monitor.      Hypertension    Well-controlled.  He will continue lisinopril 5 mg once daily and torsemide 5 mg once daily.  Check BMP.      Relevant Orders   Basic Metabolic Panel (BMET)   Postsurgical hypothyroidism    Continue Synthroid 112 mcg once daily.  Check TSH.      Relevant Orders   TSH   Venous insufficiency    Chronic and stable.  He wears compression stockings.  He will monitor.          Health Maintenance: He will get his tetanus vaccine at the pharmacy.  He reports having had 3 Covid vaccines.     This visit occurred during the SARS-CoV-2 public health emergency.  Safety protocols were in place, including screening questions prior to the visit, additional usage of staff PPE, and extensive cleaning of exam room while observing appropriate contact time as indicated for disinfecting solutions.    [name], MD [place]

## 2020-06-27 NOTE — Assessment & Plan Note (Signed)
Reports he is doing well.  He will monitor.

## 2020-06-27 NOTE — Assessment & Plan Note (Signed)
Worsened recently with anesthesia.  Has started to improve the further out he is gotten.  He can continue Advair on a daily basis and albuterol on an as-needed basis.  He will let us know if he worsens again.

## 2020-06-27 NOTE — Assessment & Plan Note (Signed)
Continue Synthroid 112 mcg once daily.  Check TSH.

## 2020-06-27 NOTE — Patient Instructions (Signed)
Nice to see you. We will get labs today and contact you with the results.  

## 2020-06-27 NOTE — Assessment & Plan Note (Signed)
Chronic and stable.  He wears compression stockings.  He will monitor.

## 2020-06-27 NOTE — Assessment & Plan Note (Signed)
Well-controlled.  He will continue lisinopril 5 mg once daily and torsemide 5 mg once daily.  Check BMP.

## 2020-06-28 ENCOUNTER — Encounter: Payer: Self-pay | Admitting: Family Medicine

## 2020-07-03 ENCOUNTER — Ambulatory Visit: Payer: Medicare HMO | Admitting: Dermatology

## 2020-07-18 ENCOUNTER — Other Ambulatory Visit: Payer: Self-pay | Admitting: Family Medicine

## 2020-07-26 ENCOUNTER — Other Ambulatory Visit: Payer: Self-pay | Admitting: Family Medicine

## 2020-08-13 ENCOUNTER — Other Ambulatory Visit: Payer: Self-pay

## 2020-08-13 ENCOUNTER — Ambulatory Visit: Payer: Medicare HMO | Admitting: Dermatology

## 2020-08-13 DIAGNOSIS — L570 Actinic keratosis: Secondary | ICD-10-CM | POA: Diagnosis not present

## 2020-08-13 DIAGNOSIS — L578 Other skin changes due to chronic exposure to nonionizing radiation: Secondary | ICD-10-CM

## 2020-08-13 NOTE — Patient Instructions (Addendum)

## 2020-08-13 NOTE — Progress Notes (Signed)
Follow-Up Visit   Subjective  Alexander Fitzgerald is a 75 y.o. male who presents for the following: Actinic Keratosis (Of the face - check for new or persistent skin lesions).  The following portions of the chart were reviewed this encounter and updated as appropriate:   Tobacco  Allergies  Meds  Problems  Med Hx  Surg Hx  Fam Hx     Review of Systems:  No other skin or systemic complaints except as noted in HPI or Assessment and Plan.  Objective  Well appearing patient in no apparent distress; mood and affect are within normal limits.  A focused examination was performed including the face. Relevant physical exam findings are noted in the Assessment and Plan.  Objective  Face, scalp (19): Erythematous thin papules/macules with gritty scale.   Assessment & Plan  AK (actinic keratosis) (19) Face, scalp  Actinic Damage - Severe, confluent actinic changes with pre-cancerous actinic keratoses  - Severe, chronic, not at goal, secondary to cumulative UV radiation exposure over time - diffuse scaly erythematous macules and papules with underlying dyspigmentation - Discussed Prescription "Field Treatment" for Severe, Chronic Confluent Actinic Changes with Pre-Cancerous Actinic Keratoses Field treatment involves treatment of an entire area of skin that has confluent Actinic Changes (Sun/ Ultraviolet light damage) and PreCancerous Actinic Keratoses by method of PhotoDynamic Therapy (PDT) and/or prescription Topical Chemotherapy agents such as 5-fluorouracil, 5-fluorouracil/calcipotriene, and/or imiquimod.  The purpose is to decrease the number of clinically evident and subclinical PreCancerous lesions to prevent progression to development of skin cancer by chemically destroying early precancer changes that may or may not be visible.  It has been shown to reduce the risk of developing skin cancer in the treated area. As a result of treatment, redness, scaling, crusting, and open sores may occur  during treatment course. One or more than one of these methods may be used and may have to be used several times to control, suppress and eliminate the PreCancerous changes. Discussed treatment course, expected reaction, and possible side effects. - Recommend daily broad spectrum sunscreen SPF 30+ to sun-exposed areas, reapply every 2 hours as needed.  - Staying in the shade or wearing long sleeves, sun glasses (UVA+UVB protection) and wide brim hats (4-inch brim around the entire circumference of the hat) are also recommended. - Call for new or changing lesions.   Destruction of lesion - Face, scalp Complexity: simple   Destruction method: cryotherapy   Informed consent: discussed and consent obtained   Timeout:  patient name, date of birth, surgical site, and procedure verified Lesion destroyed using liquid nitrogen: Yes   Region frozen until ice ball extended beyond lesion: Yes   Outcome: patient tolerated procedure well with no complications   Post-procedure details: wound care instructions given     Actinic Damage - chronic, secondary to cumulative UV radiation exposure/sun exposure over time - diffuse scaly erythematous macules with underlying dyspigmentation - Recommend daily broad spectrum sunscreen SPF 30+ to sun-exposed areas, reapply every 2 hours as needed.  - Recommend staying in the shade or wearing long sleeves, sun glasses (UVA+UVB protection) and wide brim hats (4-inch brim around the entire circumference of the hat). - Call for new or changing lesions.  Return in about 6 months (around 02/12/2021) for AKs, TBSE.  Luther Redo, CMA, am acting as scribe for Sarina Ser, MD .  Documentation: I have reviewed the above documentation for accuracy and completeness, and I agree with the above.  Sarina Ser, MD

## 2020-08-14 ENCOUNTER — Encounter: Payer: Self-pay | Admitting: Dermatology

## 2020-08-21 ENCOUNTER — Ambulatory Visit: Payer: Medicare HMO | Admitting: Dermatology

## 2020-09-14 ENCOUNTER — Other Ambulatory Visit: Payer: Self-pay | Admitting: Family Medicine

## 2020-09-15 DIAGNOSIS — G4733 Obstructive sleep apnea (adult) (pediatric): Secondary | ICD-10-CM | POA: Diagnosis not present

## 2020-09-20 ENCOUNTER — Other Ambulatory Visit: Payer: Self-pay | Admitting: Family Medicine

## 2020-10-17 ENCOUNTER — Encounter: Payer: Self-pay | Admitting: Family Medicine

## 2020-10-22 ENCOUNTER — Other Ambulatory Visit: Payer: Self-pay | Admitting: Internal Medicine

## 2020-10-22 MED ORDER — CYCLOBENZAPRINE HCL 10 MG PO TABS: 10.0000 mg | ORAL_TABLET | Freq: Three times a day (TID) | ORAL | 0 refills | Status: DC | PRN

## 2020-10-23 ENCOUNTER — Other Ambulatory Visit: Payer: Self-pay | Admitting: Dermatology

## 2020-10-23 ENCOUNTER — Other Ambulatory Visit: Payer: Self-pay | Admitting: Family Medicine

## 2020-10-29 NOTE — Telephone Encounter (Signed)
Called and left a message to call back. Received a fax from [place] stating that the Cyclobenzaprine has been denied. Per note, Pt has already had the medication filled at his pharmacy.

## 2020-10-29 NOTE — Telephone Encounter (Signed)
PT called to advise that he is returning Azzel missed call.

## 2020-10-30 NOTE — Telephone Encounter (Signed)
Called and informed Alexander Fitzgerald that flexeril PA was denied. [name] verbalized understanding and had no further questions. Pt states that his back is sore now but does not hurt. Pt states that it is much better now.

## 2020-12-17 DIAGNOSIS — G4733 Obstructive sleep apnea (adult) (pediatric): Secondary | ICD-10-CM | POA: Diagnosis not present

## 2020-12-23 ENCOUNTER — Other Ambulatory Visit: Payer: Self-pay | Admitting: Family Medicine

## 2020-12-29 ENCOUNTER — Ambulatory Visit (INDEPENDENT_AMBULATORY_CARE_PROVIDER_SITE_OTHER): Payer: Medicare HMO | Admitting: Family Medicine

## 2020-12-29 ENCOUNTER — Encounter: Payer: Self-pay | Admitting: Family Medicine

## 2020-12-29 ENCOUNTER — Other Ambulatory Visit: Payer: Self-pay

## 2020-12-29 VITALS — BP 126/84 | HR 87 | Ht 69.02 in | Wt 218.0 lb

## 2020-12-29 DIAGNOSIS — E890 Postprocedural hypothyroidism: Secondary | ICD-10-CM

## 2020-12-29 DIAGNOSIS — Z125 Encounter for screening for malignant neoplasm of prostate: Secondary | ICD-10-CM

## 2020-12-29 DIAGNOSIS — J310 Chronic rhinitis: Secondary | ICD-10-CM | POA: Diagnosis not present

## 2020-12-29 DIAGNOSIS — E7800 Pure hypercholesterolemia, unspecified: Secondary | ICD-10-CM

## 2020-12-29 DIAGNOSIS — Z23 Encounter for immunization: Secondary | ICD-10-CM | POA: Diagnosis not present

## 2020-12-29 DIAGNOSIS — I10 Essential (primary) hypertension: Secondary | ICD-10-CM | POA: Diagnosis not present

## 2020-12-29 DIAGNOSIS — J4530 Mild persistent asthma, uncomplicated: Secondary | ICD-10-CM | POA: Diagnosis not present

## 2020-12-29 DIAGNOSIS — S39012D Strain of muscle, fascia and tendon of lower back, subsequent encounter: Secondary | ICD-10-CM | POA: Diagnosis not present

## 2020-12-29 DIAGNOSIS — S39012A Strain of muscle, fascia and tendon of lower back, initial encounter: Secondary | ICD-10-CM | POA: Insufficient documentation

## 2020-12-29 DIAGNOSIS — R7303 Prediabetes: Secondary | ICD-10-CM

## 2020-12-29 LAB — COMPREHENSIVE METABOLIC PANEL
ALT: 39 U/L (ref 0–53)
AST: 23 U/L (ref 0–37)
Albumin: 4.1 g/dL (ref 3.5–5.2)
Alkaline Phosphatase: 64 U/L (ref 39–117)
BUN: 21 mg/dL (ref 6–23)
CO2: 25 mEq/L (ref 19–32)
Calcium: 9.4 mg/dL (ref 8.4–10.5)
Chloride: 104 mEq/L (ref 96–112)
Creatinine, Ser: 0.98 mg/dL (ref 0.40–1.50)
GFR: 76.03 mL/min (ref >60.00)
Glucose, Bld: 106 mg/dL — ABNORMAL HIGH (ref 70–99)
Potassium: 4.2 mEq/L (ref 3.5–5.1)
Sodium: 139 mEq/L (ref 135–145)
Total Bilirubin: 0.9 mg/dL (ref 0.2–1.2)
Total Protein: 6.3 g/dL (ref 6.0–8.3)

## 2020-12-29 LAB — LIPID PANEL
Cholesterol: 181 mg/dL (ref 0–200)
HDL: 46.2 mg/dL (ref >39.00)
LDL Cholesterol: 99 mg/dL (ref 0–99)
NonHDL: 134.67
Total CHOL/HDL Ratio: 4
Triglycerides: 178 mg/dL — ABNORMAL HIGH (ref 0.0–149.0)
VLDL: 35.6 mg/dL (ref 0.0–40.0)

## 2020-12-29 LAB — HEMOGLOBIN A1C: Hgb A1c MFr Bld: 6.3 % (ref 4.6–6.5)

## 2020-12-29 LAB — PSA, MEDICARE: PSA: 0.39 ng/ml (ref 0.10–4.00)

## 2020-12-29 LAB — TSH: TSH: 1.88 u[IU]/mL (ref 0.35–5.50)

## 2020-12-29 MED ORDER — IPRATROPIUM BROMIDE 0.03 % NA SOLN: 2.0000 | Freq: Two times a day (BID) | NASAL | 12 refills | Status: DC

## 2020-12-29 NOTE — Assessment & Plan Note (Signed)
Adequate control for age.  He will continue torsemide 5 mg daily and lisinopril 5 mg daily.

## 2020-12-29 NOTE — Progress Notes (Signed)
[name], MD Phone: [phone]  Alexander Fitzgerald is a 75 y.o. male who presents today for follow-up.  Hypertension: Patient notes its been around 120/80 when he is at the dentist.  He takes lisinopril and torsemide.  No chest pain or or edema.  Asthma: Patient notes he has had more issues with his asthma since he had his hernia surgery.  Notes over the last few days it has gotten a little bit better.  He was using Advair on a daily basis up until a few weeks ago.  If he uses his inhaler he does not have any cough, wheezing, or shortness of breath.  Its when he does not use them that he has those issues.  Hypothyroidism: He is taking Synthroid.  No skin changes.  No heat or cold intolerance.  Back spasm: Patient reports this has resolved.  It occurred in the setting of back-to-back 2-day trips in his car.  He used Flexeril and that helped resolve the issue.  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  He wonders about Atrovent nasal spray to help with this.  Social History   Tobacco Use  Smoking Status Former   Packs/day: 0.50   Years: 20.00   Pack years: 10.00   Types: Cigarettes   Quit date: 06/15/1999   Years since quitting: 21.5  Smokeless Tobacco Never    Current Outpatient Medications on File Prior to Visit  Medication Sig Dispense Refill   albuterol (VENTOLIN HFA) 108 (90 Base) MCG/ACT inhaler INHALE 2 PUFFS 4 TIMES A DAY AS NEEDED 8.5 each 2   Ascorbic Acid (VITAMIN C) 1000 MG tablet Take 1,000 mg by mouth daily. Reported on 06/26/2015     aspirin 81 MG chewable tablet Chew by mouth daily.     Cholecalciferol (VITAMIN D3) 2000 units capsule Take 4,000 Units by mouth.     Cyanocobalamin (VITAMIN B-12) 5000 MCG SUBL Place 2 each under the tongue daily.     cyclobenzaprine (FLEXERIL) 10 MG tablet Take 1 tablet (10 mg total) by mouth 3 (three) times daily as  needed for muscle spasms. 30 tablet 0   Dapsone 5 % topical gel Aczone 5 % topical gel  APPLY A SMALL AMOUNT TO AFFECTED AREA EVERY MORNING     docusate sodium (COLACE) 100 MG capsule Take 100 mg by mouth daily.     doxycycline (VIBRAMYCIN) 50 MG capsule TAKE 1 CAPSULE BY MOUTH EVERY DAY WITH FOOD 90 capsule 3   doxycycline (VIBRAMYCIN) 50 MG capsule TAKE 1 CAPSULE BY MOUTH EVERY DAY 90 capsule 2   finasteride (PROSCAR) 5 MG tablet TAKE 1 TABLET BY MOUTH EVERY DAY 90 tablet 1   fluticasone (FLONASE) 50 MCG/ACT nasal spray SPRAY 2 SPRAYS INTO EACH NOSTRIL EVERY DAY 48 mL 2   fluticasone-salmeterol (ADVAIR) 250-50 MCG/ACT AEPB TAKE 1 PUFF BY MOUTH AS NEEDED 60 each 3   levothyroxine (SYNTHROID) 112 MCG tablet TAKE 1TAB BY MOUTH ONCE DAILY ON EMPTY STOMACH WITH A GLASS OF WATER 30-60MIN BEFORE BREAKFAST 90 tablet 3   lisinopril (ZESTRIL) 5 MG tablet TAKE 1 TABLET BY MOUTH EVERY DAY 90 tablet 1   metroNIDAZOLE (METROGEL) 1 % gel metronidazole 1 % topical gel  APPLY TO AFFECTED AREA EVERY DAY     montelukast (SINGULAIR) 10 MG tablet TAKE 1 TABLET BY MOUTH EVERYDAY AT BEDTIME 90 tablet 1   Multiple Vitamin (MULTIVITAMIN)  capsule Take 1 capsule by mouth daily.     pantoprazole (PROTONIX) 40 MG tablet TAKE 1 TABLET BY MOUTH EVERY DAY BEFORE A MEAL AS DIRECTED 90 tablet 3   Pyridoxine HCl 200 MG TBCR Take by mouth.     torsemide (DEMADEX) 5 MG tablet TAKE 1 TABLET BY MOUTH EVERY DAY 90 tablet 1   No current facility-administered medications on file prior to visit.     ROS see history of present illness  Objective  Physical Exam Vitals:   12/29/20 1005  BP: 126/84  Pulse: 87  SpO2: 95%    BP Readings from Last 3 Encounters:  12/29/20 126/84  06/27/20 120/80  03/03/20 125/70   Wt Readings from Last 3 Encounters:  12/29/20 218 lb (98.9 kg)  06/27/20 214 lb 12.8 oz (97.4 kg)  03/03/20 216 lb 1.9 oz (98 kg)    Physical Exam Constitutional:      General: He is not in acute  distress.    Appearance: He is not diaphoretic.  Cardiovascular:     Rate and Rhythm: Normal rate and regular rhythm.     Heart sounds: Normal heart sounds.  Pulmonary:     Effort: Pulmonary effort is normal.     Breath sounds: Normal breath sounds.  Skin:    General: Skin is warm and dry.  Neurological:     Mental Status: He is alert.     Assessment/Plan: Please see individual problem list.  Problem List Items Addressed This Visit     Asthma    Somewhat poorly controlled.  He will use his Advair on a daily basis.  If not improving with this he will let us know.      Back strain    Symptoms have resolved.  I suspect this was related to riding in the car for so long.  He will monitor for recurrence.      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  We will trial Atrovent nasal spray.  If not beneficial he will let us know.      Relevant Medications   ipratropium (ATROVENT) 0.03 % nasal spray   Hypertension - Primary    Adequate control for age.  He will continue torsemide 5 mg daily and lisinopril 5 mg daily.      Relevant Orders   Comp Met (CMET)   Postsurgical hypothyroidism    Check TSH.  Continue levothyroxine 112 mcg once daily.      Relevant Orders   TSH   Pure hypercholesterolemia    Check lipid panel.      Relevant Orders   Lipid panel   Other Visit Diagnoses     Prediabetes       Relevant Orders   HgB A1c   Prostate cancer screening       Relevant Orders   PSA, Medicare ( [place] only)   Need for influenza vaccination       Relevant Orders   Flu Vaccine QUAD High Dose(Fluad) (Completed)        Health Maintenance: the patient knows to get his shingrix and tetanus vaccines at the pharmacy. He will get the newest booster once it is available. Flu vaccine given today.   Return in about 6 months (around 06/28/2021).  This visit occurred during the SARS-CoV-2 public health emergency.  Safety protocols were in  place, including screening questions prior to the visit, additional usage of staff PPE, and extensive cleaning of exam room while observing  appropriate contact time as indicated for disinfecting solutions.    [name], MD [place]

## 2020-12-29 NOTE — Assessment & Plan Note (Signed)
Symptoms have resolved.  I suspect this was related to riding in the car for so long.  He will monitor for recurrence.

## 2020-12-29 NOTE — Assessment & Plan Note (Signed)
Symptoms are consistent with gustatory rhinitis.  We will trial Atrovent nasal spray.  If not beneficial he will let us know.

## 2020-12-29 NOTE — Assessment & Plan Note (Signed)
Check lipid panel  

## 2020-12-29 NOTE — Assessment & Plan Note (Signed)
Somewhat poorly controlled.  He will use his Advair on a daily basis.  If not improving with this he will let us know.

## 2020-12-29 NOTE — Patient Instructions (Signed)
Nice to see you. Please try the Atrovent nasal spray to see if that helps with your runny nose. We will get lab work today and contact you with the results. Please use your Advair daily as prescribed.

## 2020-12-29 NOTE — Assessment & Plan Note (Signed)
Check TSH.  Continue levothyroxine 112 mcg once daily.

## 2021-01-02 DIAGNOSIS — H2513 Age-related nuclear cataract, bilateral: Secondary | ICD-10-CM | POA: Diagnosis not present

## 2021-01-02 DIAGNOSIS — H524 Presbyopia: Secondary | ICD-10-CM | POA: Diagnosis not present

## 2021-01-02 DIAGNOSIS — H43812 Vitreous degeneration, left eye: Secondary | ICD-10-CM | POA: Diagnosis not present

## 2021-01-12 ENCOUNTER — Ambulatory Visit (INDEPENDENT_AMBULATORY_CARE_PROVIDER_SITE_OTHER): Payer: Medicare HMO

## 2021-01-12 VITALS — Ht 69.02 in | Wt 218.0 lb

## 2021-01-12 DIAGNOSIS — Z0000 Encounter for general adult medical examination without abnormal findings: Secondary | ICD-10-CM

## 2021-01-12 NOTE — Patient Instructions (Addendum)
  Alexander Fitzgerald , Thank you for taking time to come for your Medicare Wellness Visit. I appreciate your ongoing commitment to your health goals. Please review the following plan we discussed and let me know if I can assist you in the future.   These are the goals we discussed:  Goals       Patient Stated     Weight 185lb-195lb (pt-stated)      I want to lose weight.  Stay active Healthy diet         This is a list of the screening recommended for you and due dates:  Health Maintenance  Topic Date Due   COVID-19 Vaccine (3 - Moderna risk series) 01/28/2021*   Zoster (Shingles) Vaccine (2 of 2) 04/13/2021*   Tetanus Vaccine  01/12/2022*   Colon Cancer Screening  12/26/2024   Flu Shot  Completed   Hepatitis C Screening: USPSTF Recommendation to screen - Ages 18-79 yo.  Completed   HPV Vaccine  Aged Out  *Topic was postponed. The date shown is not the original due date.

## 2021-01-12 NOTE — Progress Notes (Signed)
Subjective:   GEORGES VICTORIO is a 75 y.o. male who presents for Medicare Annual/Subsequent preventive examination.  Review of Systems    No ROS.  Medicare Wellness Virtual Visit.  Visual/audio telehealth visit, UTA vital signs.   See social history for additional risk factors.   Cardiac Risk Factors include: advanced age (>92men, >57 women);male gender;hypertension     Objective:    Today's Vitals   01/12/21 0951  Weight: 218 lb (98.9 kg)  Height: 5' 9.02" (1.753 m)   Body mass index is 32.17 kg/m.  Advanced Directives 01/12/2021 01/10/2020 01/09/2019 12/16/2017 05/05/2015  Does Patient Have a Medical Advance Directive? Yes Yes Yes Yes Yes  Type of Paramedic of [place];Living will [place];Living will [place];Living will [place];Living will [place];Living will  Does patient want to make changes to medical advance directive? No - Patient declined No - Patient declined No - Patient declined No - Patient declined No - Patient declined  Copy of [place] in Chart? No - copy requested No - copy requested No - copy requested No - copy requested No - copy requested    Current Medications (verified) Outpatient Encounter Medications as of 01/12/2021  Medication Sig   albuterol (VENTOLIN HFA) 108 (90 Base) MCG/ACT inhaler INHALE 2 PUFFS 4 TIMES A DAY AS NEEDED   Ascorbic Acid (VITAMIN C) 1000 MG tablet Take 1,000 mg by mouth daily. Reported on 06/26/2015   aspirin 81 MG chewable tablet Chew by mouth daily.   Cholecalciferol (VITAMIN D3) 2000 units capsule Take 4,000 Units by mouth.   Cyanocobalamin (VITAMIN B-12) 5000 MCG SUBL Place 2 each under the tongue daily.   cyclobenzaprine (FLEXERIL) 10 MG tablet Take 1 tablet (10 mg total) by mouth 3 (three) times daily as needed for muscle spasms.   Dapsone 5 % topical gel Aczone 5 % topical gel  APPLY A SMALL AMOUNT TO  AFFECTED AREA EVERY MORNING   docusate sodium (COLACE) 100 MG capsule Take 100 mg by mouth daily.   doxycycline (VIBRAMYCIN) 50 MG capsule TAKE 1 CAPSULE BY MOUTH EVERY DAY WITH FOOD   doxycycline (VIBRAMYCIN) 50 MG capsule TAKE 1 CAPSULE BY MOUTH EVERY DAY   finasteride (PROSCAR) 5 MG tablet TAKE 1 TABLET BY MOUTH EVERY DAY   fluticasone (FLONASE) 50 MCG/ACT nasal spray SPRAY 2 SPRAYS INTO EACH NOSTRIL EVERY DAY   fluticasone-salmeterol (ADVAIR) 250-50 MCG/ACT AEPB TAKE 1 PUFF BY MOUTH AS NEEDED   ipratropium (ATROVENT) 0.03 % nasal spray Place 2 sprays into both nostrils every 12 (twelve) hours.   levothyroxine (SYNTHROID) 112 MCG tablet TAKE 1TAB BY MOUTH ONCE DAILY ON EMPTY STOMACH WITH A GLASS OF WATER 30-60MIN BEFORE BREAKFAST   lisinopril (ZESTRIL) 5 MG tablet TAKE 1 TABLET BY MOUTH EVERY DAY   metroNIDAZOLE (METROGEL) 1 % gel metronidazole 1 % topical gel  APPLY TO AFFECTED AREA EVERY DAY   montelukast (SINGULAIR) 10 MG tablet TAKE 1 TABLET BY MOUTH EVERYDAY AT BEDTIME   Multiple Vitamin (MULTIVITAMIN) capsule Take 1 capsule by mouth daily.   pantoprazole (PROTONIX) 40 MG tablet TAKE 1 TABLET BY MOUTH EVERY DAY BEFORE A MEAL AS DIRECTED   Pyridoxine HCl 200 MG TBCR Take by mouth.   torsemide (DEMADEX) 5 MG tablet TAKE 1 TABLET BY MOUTH EVERY DAY   No facility-administered encounter medications on file as of 01/12/2021.    Allergies (verified) Sulfa drugs cross reactors   History:  Past Medical History:  Diagnosis Date   Asthma    adult onset, PFTs done in [facility] [facility])    Several moles removed in past, all pre-cancerous.  Recently followed by Dr. Nehemiah Massed   Depression    Treated with Prozac in 1992,  Associate with job loss   Esophageal reflux    Gout    Hiatal hernia    Hypertension    Squamous cell carcinoma of skin 07/30/2015   R anterior lat neck, Atypical squamous proliferation, excised 08/28/2015   Past Surgical History:  Procedure Laterality Date    APPENDECTOMY     COLONOSCOPY N/A 12/27/2014   Procedure: COLONOSCOPY;  Surgeon: Lollie Sails, MD;  Location: [facility] ENDOSCOPY;  Service: Endoscopy;  Laterality: N/A;   LAPAROSCOPIC PARAESOPHAGEAL HERNIA REPAIR     TONSILLECTOMY     Family History  Problem Relation Age of Onset   Hypertension Mother    Hyperlipidemia Mother    Diabetes Mother    Heart attack Father 60       MI   Hypertension Father    Cancer Sister        pancreatic   Social History   Socioeconomic History   Marital status: Married    Spouse name: Not on file   Number of children: Not on file   Years of education: Not on file   Highest education level: Not on file  Occupational History   Not on file  Tobacco Use   Smoking status: Former    Packs/day: 0.50    Years: 20.00    Pack years: 10.00    Types: Cigarettes    Quit date: 06/15/1999    Years since quitting: 21.5   Smokeless tobacco: Never  Substance and Sexual Activity   Alcohol use: Yes    Alcohol/week: 1.0 standard drink    Types: 1 Standard drinks or equivalent per week    Comment: rarely   Drug use: No   Sexual activity: Yes  Other Topics Concern   Not on file  Social History Narrative   Not on file   Social Determinants of Health   Financial Resource Strain: Low Risk    Difficulty of Paying Living Expenses: Not hard at all  Food Insecurity: No Food Insecurity   Worried About Charity fundraiser in the Last Year: Never true   [place] in the Last Year: Never true  Transportation Needs: No Transportation Needs   Lack of Transportation (Medical): No   Lack of Transportation (Non-Medical): No  Physical Activity: Sufficiently Active   Days of Exercise per Week: 7 days   Minutes of Exercise per Session: 30 min  Stress: No Stress Concern Present   Feeling of Stress : Not at all  Social Connections: Unknown   Frequency of Communication with Friends and Family: More than three times a week   Frequency of Social Gatherings with  Friends and Family: More than three times a week   Attends Religious Services: Not on Electrical engineer or Organizations: Not on file   Attends Archivist Meetings: Not on file   Marital Status: Married   Tobacco Counseling Counseling given: Not Answered   Clinical Intake:  Pre-visit preparation completed: Yes       Diabetes: No  How often do you need to have someone help you when you read instructions, pamphlets, or other written materials from your doctor or pharmacy?: 1 - Never  Interpreter Needed?: No      Activities of Daily Living In your present state of health, do you have any difficulty performing the following activities: 01/12/2021  Hearing? Y  Comment Hearing aids  Vision? N  Difficulty concentrating or making decisions? N  Walking or climbing stairs? N  Dressing or bathing? N  Doing errands, shopping? N  Preparing Food and eating ? N  Using the Toilet? N  In the past six months, have you accidently leaked urine? N  Do you have problems with loss of bowel control? N  Managing your Medications? N  Managing your Finances? N  Housekeeping or managing your Housekeeping? N  Some recent data might be hidden    Patient Care Team: Leone Haven, MD as PCP - General (Family Medicine) Rockey Situ Kathlene November, MD as Consulting Physician (Cardiology)  Indicate any recent Medical Services you may have received from other than [facility] providers in the past year (date may be approximate).     Assessment:   This is a routine wellness examination for [place].  I connected with Aristidis today by telephone and verified that I am speaking with the correct person using two identifiers. Location patient: home Location provider: work Persons participating in the virtual visit: patient, Marine scientist.    I discussed the limitations, risks, security and privacy concerns of performing an evaluation and management service by telephone and the availability of in  person appointments. The patient expressed understanding and verbally consented to this telephonic visit.    Interactive audio and video telecommunications were attempted between this provider and patient, however failed, due to patient having technical difficulties OR patient did not have access to video capability.  We continued and completed visit with audio only.  Some vital signs may be absent or patient reported.   Hearing/Vision screen Hearing Screening - Comments:: Hearing aids Vision Screening - Comments:: [facility]  Wears corrective lenses  Annual visits They have regular follow up with the ophthalmologist  Dietary issues and exercise activities discussed: Current Exercise Habits: Home exercise routine, Type of exercise: walking, Intensity: Mild   Goals Addressed               This Visit's Progress     Patient Stated     Weight 185lb-195lb (pt-stated)        I want to lose weight.  Stay active Healthy diet        Depression Screen PHQ 2/9 Scores 01/12/2021 12/29/2020 06/27/2020 01/10/2020 12/19/2019 01/09/2019 12/18/2018  PHQ - 2 Score 0 0 0 0 0 0 0    Fall Risk Fall Risk  01/12/2021 12/29/2020 06/27/2020 01/10/2020 12/19/2019  Falls in the past year? 0 0 0 0 0  Number falls in past yr: - 0 0 0 0  Injury with Fall? - 0 - - -  Follow up Falls evaluation completed Falls evaluation completed Falls evaluation completed Falls evaluation completed Falls evaluation completed    [place]: Adequate lighting in your home to reduce risk of falls? Yes   ASSISTIVE DEVICES UTILIZED TO PREVENT FALLS: Life alert? No  Use of a cane, walker or w/c? No   TIMED UP AND GO: Was the test performed? No .   Cognitive Function: Patient is alert and oriented x3.  MMSE - Mini Mental State Exam 12/16/2017 05/05/2015  Orientation to time 5 5  Orientation to Place 5 5  Registration 3 3  Attention/ Calculation 5 5  Recall  3 3   Language- name 2 objects 2 2  Language- repeat 1 1  Language- follow 3 step command 3 3  Language- read & follow direction 1 1  Write a sentence 1 1  Copy design 1 1  Total score 30 30     6CIT Screen 01/09/2019  What Year? 0 points  What month? 0 points  What time? 0 points  Count back from 20 0 points  Months in reverse 0 points  Repeat phrase 0 points  Total Score 0    Immunizations Immunization History  Administered Date(s) Administered   Fluad Quad(high Dose 65+) 01/01/2019, 12/19/2019, 12/29/2020   Influenza Split 03/03/2013   Influenza, High Dose Seasonal PF 02/08/2017, 02/13/2018, 01/01/2019   Influenza,inj,Quad PF,6+ Mos 01/16/2014   Influenza-Unspecified 01/25/2012, 02/18/2015, 02/01/2016, 02/08/2017   Moderna Sars-Covid-2 Vaccination 03/24/2020, 09/14/2020   Pneumococcal Conjugate-13 06/04/2013   Pneumococcal Polysaccharide-23 10/25/2008, 05/05/2015   Tdap 04/26/2009   Zoster Recombinat (Shingrix) 09/19/2017, 10/12/2017   Zoster, Live 03/03/2013    TDAP status: Due, Education has been provided regarding the importance of this vaccine. Advised may receive this vaccine at local pharmacy or Health Dept. Aware to provide a copy of the vaccination record if obtained from local pharmacy or Health Dept. Verbalized acceptance and understanding. Deferred.   Covid vaccine- agrees to update immunization record. Notes 2 boosters complete.   Shingrix vaccine- awaiting second administration in the series.   Health Maintenance Health Maintenance  Topic Date Due   COVID-19 Vaccine (3 - Moderna risk series) 01/28/2021 (Originally 10/12/2020)   Zoster Vaccines- Shingrix (2 of 2) 04/13/2021 (Originally 12/07/2017)   TETANUS/TDAP  01/12/2022 (Originally 04/27/2019)   COLONOSCOPY (Pts 45-22yrs Insurance coverage will need to be confirmed)  12/26/2024   INFLUENZA VACCINE  Completed   Hepatitis C Screening  Completed   HPV VACCINES  Aged Out   Vision Screening: Recommended annual  ophthalmology exams for early detection of glaucoma and other disorders of the eye.  Dental Screening: Recommended annual dental exams for proper oral hygiene  Community Resource Referral / Chronic Care Management: CRR required this visit?  No   CCM required this visit?  No      Plan:     I have personally reviewed and noted the following in the patient's chart:   Medical and social history Use of alcohol, tobacco or illicit drugs  Current medications and supplements including opioid prescriptions. Patient is not currently taking opioid prescriptions. Functional ability and status Nutritional status Physical activity Advanced directives List of other physicians Hospitalizations, surgeries, and ER visits in previous 12 months Vitals Screenings to include cognitive, depression, and falls Referrals and appointments  In addition, I have reviewed and discussed with patient certain preventive protocols, quality metrics, and best practice recommendations. A written personalized care plan for preventive services as well as general preventive health recommendations were provided to patient.     Varney Biles, LPN   [id number]

## 2021-02-03 ENCOUNTER — Ambulatory Visit: Payer: Medicare HMO | Admitting: Podiatry

## 2021-02-03 ENCOUNTER — Other Ambulatory Visit: Payer: Self-pay

## 2021-02-03 DIAGNOSIS — L603 Nail dystrophy: Secondary | ICD-10-CM | POA: Diagnosis not present

## 2021-02-03 NOTE — Progress Notes (Signed)
Subjective:  Patient ID: Alexander Fitzgerald, male    DOB: [birth year],  MRN: [id number]  No chief complaint on file.   75 y.o. male presents with the above complaint.  Patient presents with complaint of left hallux nail dystrophic nail.  Patient had a nail contusion back in 2020 and the nail has not grown back the same way.  He wanted get it evaluated.  He was little bit painful but is doing much better today.  He denies any other acute complaints.  He has not seen anyone else prior to seeing me.   Review of Systems: Negative except as noted in the HPI. Denies N/V/F/Ch.  Past Medical History:  Diagnosis Date   Asthma    adult onset, PFTs done in [place] [facility])    Several moles removed in past, all pre-cancerous.  Recently followed by Dr. Nehemiah Massed   Depression    Treated with Prozac in 1992,  Associate with job loss   Esophageal reflux    Gout    Hiatal hernia    Hypertension    Squamous cell carcinoma of skin 07/30/2015   R anterior lat neck, Atypical squamous proliferation, excised 08/28/2015    Current Outpatient Medications:    albuterol (VENTOLIN HFA) 108 (90 Base) MCG/ACT inhaler, INHALE 2 PUFFS 4 TIMES A DAY AS NEEDED, Disp: 8.5 each, Rfl: 2   Ascorbic Acid (VITAMIN C) 1000 MG tablet, Take 1,000 mg by mouth daily. Reported on 06/26/2015, Disp: , Rfl:    aspirin 81 MG chewable tablet, Chew by mouth daily., Disp: , Rfl:    Cholecalciferol (VITAMIN D3) 2000 units capsule, Take 4,000 Units by mouth., Disp: , Rfl:    Cyanocobalamin (VITAMIN B-12) 5000 MCG SUBL, Place 2 each under the tongue daily., Disp: , Rfl:    cyclobenzaprine (FLEXERIL) 10 MG tablet, Take 1 tablet (10 mg total) by mouth 3 (three) times daily as needed for muscle spasms., Disp: 30 tablet, Rfl: 0   Dapsone 5 % topical gel, Aczone 5 % topical gel  APPLY A SMALL AMOUNT TO AFFECTED AREA EVERY MORNING, Disp: , Rfl:    docusate sodium (COLACE) 100 MG capsule, Take 100 mg by mouth daily., Disp: , Rfl:    doxycycline  (VIBRAMYCIN) 50 MG capsule, TAKE 1 CAPSULE BY MOUTH EVERY DAY WITH FOOD, Disp: 90 capsule, Rfl: 3   doxycycline (VIBRAMYCIN) 50 MG capsule, TAKE 1 CAPSULE BY MOUTH EVERY DAY, Disp: 90 capsule, Rfl: 2   finasteride (PROSCAR) 5 MG tablet, TAKE 1 TABLET BY MOUTH EVERY DAY, Disp: 90 tablet, Rfl: 1   fluticasone (FLONASE) 50 MCG/ACT nasal spray, SPRAY 2 SPRAYS INTO EACH NOSTRIL EVERY DAY, Disp: 48 mL, Rfl: 2   fluticasone-salmeterol (ADVAIR) 250-50 MCG/ACT AEPB, TAKE 1 PUFF BY MOUTH AS NEEDED, Disp: 60 each, Rfl: 3   ipratropium (ATROVENT) 0.03 % nasal spray, Place 2 sprays into both nostrils every 12 (twelve) hours., Disp: 30 mL, Rfl: 12   levothyroxine (SYNTHROID) 112 MCG tablet, TAKE 1TAB BY MOUTH ONCE DAILY ON EMPTY STOMACH WITH A GLASS OF WATER 30-60MIN BEFORE BREAKFAST, Disp: 90 tablet, Rfl: 3   lisinopril (ZESTRIL) 5 MG tablet, TAKE 1 TABLET BY MOUTH EVERY DAY, Disp: 90 tablet, Rfl: 1   metroNIDAZOLE (METROGEL) 1 % gel, metronidazole 1 % topical gel  APPLY TO AFFECTED AREA EVERY DAY, Disp: , Rfl:    montelukast (SINGULAIR) 10 MG tablet, TAKE 1 TABLET BY MOUTH EVERYDAY AT BEDTIME, Disp: 90 tablet, Rfl: 1   Multiple Vitamin (MULTIVITAMIN)  capsule, Take 1 capsule by mouth daily., Disp: , Rfl:    pantoprazole (PROTONIX) 40 MG tablet, TAKE 1 TABLET BY MOUTH EVERY DAY BEFORE A MEAL AS DIRECTED, Disp: 90 tablet, Rfl: 3   Pyridoxine HCl 200 MG TBCR, Take by mouth., Disp: , Rfl:    torsemide (DEMADEX) 5 MG tablet, TAKE 1 TABLET BY MOUTH EVERY DAY, Disp: 90 tablet, Rfl: 1  Social History   Tobacco Use  Smoking Status Former   Packs/day: 0.50   Years: 20.00   Pack years: 10.00   Types: Cigarettes   Quit date: 06/15/1999   Years since quitting: 21.6  Smokeless Tobacco Never    Allergies  Allergen Reactions   Sulfa Drugs Cross Reactors     Blood in urine   Objective:  There were no vitals filed for this visit. There is no height or weight on file to calculate BMI. Constitutional Well  developed. Well nourished.  Vascular Dorsalis pedis pulses palpable bilaterally. Posterior tibial pulses palpable bilaterally. Capillary refill normal to all digits.  No cyanosis or clubbing noted. Pedal hair growth normal.  Neurologic Normal speech. Oriented to person, place, and time. Epicritic sensation to light touch grossly present bilaterally.  Dermatologic Thickening elongated dystrophic mycotic nail noted to the left hallux.  No pain on palpation.  No hematoma noted.  No ingrowing noted.  Orthopedic: Normal joint ROM without pain or crepitus bilaterally. No visible deformities. No bony tenderness.   Radiographs: None Assessment:   1. Onychodystrophy    Plan:  Patient was evaluated and treated and all questions answered.  Left hallux nail dystrophy without ingrown -I explained the patient the etiology of nail dystrophy and various treatment options were discussed.  I debrided the nail down with a English nail nipper.  Patient does not have any pain at this time however will hold off on taking the nail off.  If any foot and ankle issues arise in the future of asked him to come see me.  He states understanding  No follow-ups on file.

## 2021-02-04 ENCOUNTER — Ambulatory Visit: Payer: Medicare HMO | Admitting: Dermatology

## 2021-02-04 ENCOUNTER — Encounter: Payer: Self-pay | Admitting: Dermatology

## 2021-02-04 DIAGNOSIS — B360 Pityriasis versicolor: Secondary | ICD-10-CM

## 2021-02-04 DIAGNOSIS — Z85828 Personal history of other malignant neoplasm of skin: Secondary | ICD-10-CM

## 2021-02-04 DIAGNOSIS — Z872 Personal history of diseases of the skin and subcutaneous tissue: Secondary | ICD-10-CM

## 2021-02-04 DIAGNOSIS — Z1283 Encounter for screening for malignant neoplasm of skin: Secondary | ICD-10-CM

## 2021-02-04 DIAGNOSIS — L821 Other seborrheic keratosis: Secondary | ICD-10-CM | POA: Diagnosis not present

## 2021-02-04 DIAGNOSIS — D225 Melanocytic nevi of trunk: Secondary | ICD-10-CM

## 2021-02-04 DIAGNOSIS — L570 Actinic keratosis: Secondary | ICD-10-CM | POA: Diagnosis not present

## 2021-02-04 DIAGNOSIS — L820 Inflamed seborrheic keratosis: Secondary | ICD-10-CM | POA: Diagnosis not present

## 2021-02-04 DIAGNOSIS — L814 Other melanin hyperpigmentation: Secondary | ICD-10-CM

## 2021-02-04 DIAGNOSIS — L609 Nail disorder, unspecified: Secondary | ICD-10-CM | POA: Diagnosis not present

## 2021-02-04 DIAGNOSIS — D692 Other nonthrombocytopenic purpura: Secondary | ICD-10-CM

## 2021-02-04 DIAGNOSIS — D1801 Hemangioma of skin and subcutaneous tissue: Secondary | ICD-10-CM

## 2021-02-04 DIAGNOSIS — L578 Other skin changes due to chronic exposure to nonionizing radiation: Secondary | ICD-10-CM

## 2021-02-04 MED ORDER — KETOCONAZOLE 2 % EX SHAM: MEDICATED_SHAMPOO | CUTANEOUS | 11 refills | Status: AC

## 2021-02-04 NOTE — Patient Instructions (Signed)

## 2021-02-04 NOTE — Progress Notes (Signed)
Follow-Up Visit   Subjective  Alexander Fitzgerald is a 75 y.o. male who presents for the following: Annual Exam (Hx AK's and SCC). The patient presents for Total-Body Skin Exam (TBSE) for skin cancer screening and mole check.  The following portions of the chart were reviewed this encounter and updated as appropriate:   Tobacco  Allergies  Meds  Problems  Med Hx  Surg Hx  Fam Hx     Review of Systems:  No other skin or systemic complaints except as noted in HPI or Assessment and Plan.  Objective  Well appearing patient in no apparent distress; mood and affect are within normal limits.  A full examination was performed including scalp, head, eyes, ears, nose, lips, neck, chest, axillae, abdomen, back, buttocks, bilateral upper extremities, bilateral lower extremities, hands, feet, fingers, toes, fingernails, and toenails. All findings within normal limits unless otherwise noted below.  Face x 8 (8) Erythematous thin papules/macules with gritty scale.   R earlobe, L temple (2) Erythematous keratotic or waxy stuck-on papule or plaque.   L great toenail Toenail dystrophy.    Assessment & Plan  AK (actinic keratosis) (8) Face x 8  Destruction of lesion - Face x 8 Complexity: simple   Destruction method: cryotherapy   Informed consent: discussed and consent obtained   Timeout:  patient name, date of birth, surgical site, and procedure verified Lesion destroyed using liquid nitrogen: Yes   Region frozen until ice ball extended beyond lesion: Yes   Outcome: patient tolerated procedure well with no complications   Post-procedure details: wound care instructions given    Inflamed seborrheic keratosis R earlobe, L temple  Destruction of lesion - R earlobe, L temple Complexity: simple   Destruction method: cryotherapy   Informed consent: discussed and consent obtained   Timeout:  patient name, date of birth, surgical site, and procedure verified Lesion destroyed using liquid  nitrogen: Yes   Region frozen until ice ball extended beyond lesion: Yes   Outcome: patient tolerated procedure well with no complications   Post-procedure details: wound care instructions given    Tinea versicolor Trunk Start Ketoconazole 2% shampoo in the shower from the waist up twice per week.   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  It is generally asymptomatic. The rash is due to overgrowth of a common type of yeast present on everyone's skin and it is not contagious.  It tends to flare more in the summer due to increased sweating on trunk.  After rash is treated, the scaliness will resolve, but the discoloration will take longer to return to normal pigmentation. The periodic use of an OTC medicated soap/shampoo with zinc or selenium sulfide can be helpful to prevent yeast overgrowth and recurrence.  ketoconazole (NIZORAL) 2 % shampoo - Trunk Shampoo from the waist up let sit 5 minutes then wash off. Use twice per week for 2 mths. Then QM there after.  Nail problem L great toenail Due to trauma - Benign-appearing.  Observation.  Call clinic for new or changing lesions.  Recommend daily use of broad spectrum spf 30+ sunscreen to sun-exposed areas.   Skin cancer screening  Lentigines - Scattered tan macules - Due to sun exposure - Benign-appearing, observe - Recommend daily broad spectrum sunscreen SPF 30+ to sun-exposed areas, reapply every 2 hours as needed. - Call for any changes  Seborrheic Keratoses - Stuck-on, waxy, tan-brown papules and/or plaques  -  Benign-appearing - Discussed benign etiology and prognosis. - Observe - Call for any changes  Melanocytic Nevi - Tan-brown and/or pink-flesh-colored symmetric macules and papules - Benign appearing on exam today - Observation - Call clinic for new or changing moles - Recommend daily use of broad spectrum spf 30+ sunscreen to  sun-exposed areas.   Hemangiomas - Red papules - Discussed benign nature - Observe - Call for any changes  Actinic Damage - Chronic condition, secondary to cumulative UV/sun exposure - diffuse scaly erythematous macules with underlying dyspigmentation - Recommend daily broad spectrum sunscreen SPF 30+ to sun-exposed areas, reapply every 2 hours as needed.  - Staying in the shade or wearing long sleeves, sun glasses (UVA+UVB protection) and wide brim hats (4-inch brim around the entire circumference of the hat) are also recommended for sun protection.  - Call for new or changing lesions.  History of Squamous Cell Carcinoma of the Skin - No evidence of recurrence today - No lymphadenopathy - Recommend regular full body skin exams - Recommend daily broad spectrum sunscreen SPF 30+ to sun-exposed areas, reapply every 2 hours as needed.  - Call if any new or changing lesions are noted between office visits  Purpura - Chronic; persistent and recurrent.  Treatable, but not curable. - Violaceous macules and patches - Benign - Related to trauma, age, sun damage and/or use of blood thinners, chronic use of topical and/or oral steroids - Observe - Can use OTC arnica containing moisturizer such as Dermend Bruise Formula if desired - Call for worsening or other concerns  Skin cancer screening performed today.  Return for 6-9 mths for AK f/u .  Luther Redo, CMA, am acting as scribe for Sarina Ser, MD . Documentation: I have reviewed the above documentation for accuracy and completeness, and I agree with the above.  Sarina Ser, MD

## 2021-02-24 ENCOUNTER — Encounter: Payer: Self-pay | Admitting: Family Medicine

## 2021-02-26 NOTE — Telephone Encounter (Signed)
I believe there should be an [facility] order form somewhere in the office. Can you see if Claria Dice, or Juliann Pulse know where they are? If you are able to locate it then it could be placed in my basket to fill out.

## 2021-02-27 NOTE — Telephone Encounter (Signed)
I put the CPAP form in your sign basket for this patient.  Alexander Fitzgerald,cma

## 2021-03-20 DIAGNOSIS — G4733 Obstructive sleep apnea (adult) (pediatric): Secondary | ICD-10-CM | POA: Diagnosis not present

## 2021-03-24 NOTE — Telephone Encounter (Signed)
Prescription filled out for the CPAP. Please attach the appropriate information and fax.

## 2021-03-25 ENCOUNTER — Other Ambulatory Visit: Payer: Self-pay | Admitting: Family Medicine

## 2021-03-26 ENCOUNTER — Other Ambulatory Visit: Payer: Self-pay | Admitting: Family Medicine

## 2021-03-26 NOTE — Telephone Encounter (Signed)
I faxed demographics and insurance information to [facility] for new CPAP machine.  Alexander Fitzgerald,cma

## 2021-04-03 DIAGNOSIS — G4733 Obstructive sleep apnea (adult) (pediatric): Secondary | ICD-10-CM | POA: Diagnosis not present

## 2021-04-19 ENCOUNTER — Other Ambulatory Visit: Payer: Self-pay | Admitting: Family Medicine

## 2021-04-26 ENCOUNTER — Other Ambulatory Visit: Payer: Self-pay | Admitting: Family Medicine

## 2021-04-30 ENCOUNTER — Other Ambulatory Visit: Payer: Self-pay | Admitting: Family Medicine

## 2021-05-04 ENCOUNTER — Encounter: Payer: Self-pay | Admitting: Family Medicine

## 2021-05-04 ENCOUNTER — Other Ambulatory Visit: Payer: Self-pay | Admitting: Internal Medicine

## 2021-05-04 DIAGNOSIS — G4733 Obstructive sleep apnea (adult) (pediatric): Secondary | ICD-10-CM | POA: Diagnosis not present

## 2021-05-04 MED ORDER — CYCLOBENZAPRINE HCL 10 MG PO TABS: 10.0000 mg | ORAL_TABLET | Freq: Three times a day (TID) | ORAL | 0 refills | Status: DC | PRN

## 2021-05-04 NOTE — Telephone Encounter (Signed)
Patient called about a refill on his cyclobenzaprine (FLEXERIL) 10 MG tablet.

## 2021-05-06 ENCOUNTER — Telehealth: Payer: Self-pay

## 2021-05-06 NOTE — Telephone Encounter (Addendum)
I did a Prior Authorization on Cyclobenzaprine and the medication was approved.  From 04/19/2021-08/03/2021. Pharmacy was notified.  Cassian Torelli,cma

## 2021-05-15 DIAGNOSIS — M9905 Segmental and somatic dysfunction of pelvic region: Secondary | ICD-10-CM | POA: Diagnosis not present

## 2021-05-15 DIAGNOSIS — M9903 Segmental and somatic dysfunction of lumbar region: Secondary | ICD-10-CM | POA: Diagnosis not present

## 2021-05-15 DIAGNOSIS — M5417 Radiculopathy, lumbosacral region: Secondary | ICD-10-CM | POA: Diagnosis not present

## 2021-05-15 DIAGNOSIS — M4306 Spondylolysis, lumbar region: Secondary | ICD-10-CM | POA: Diagnosis not present

## 2021-05-16 ENCOUNTER — Other Ambulatory Visit: Payer: Self-pay | Admitting: Family Medicine

## 2021-05-18 DIAGNOSIS — M9903 Segmental and somatic dysfunction of lumbar region: Secondary | ICD-10-CM | POA: Diagnosis not present

## 2021-05-18 DIAGNOSIS — M4306 Spondylolysis, lumbar region: Secondary | ICD-10-CM | POA: Diagnosis not present

## 2021-05-18 DIAGNOSIS — M5417 Radiculopathy, lumbosacral region: Secondary | ICD-10-CM | POA: Diagnosis not present

## 2021-05-18 DIAGNOSIS — M9905 Segmental and somatic dysfunction of pelvic region: Secondary | ICD-10-CM | POA: Diagnosis not present

## 2021-05-20 DIAGNOSIS — M9903 Segmental and somatic dysfunction of lumbar region: Secondary | ICD-10-CM | POA: Diagnosis not present

## 2021-05-20 DIAGNOSIS — M5417 Radiculopathy, lumbosacral region: Secondary | ICD-10-CM | POA: Diagnosis not present

## 2021-05-20 DIAGNOSIS — M9905 Segmental and somatic dysfunction of pelvic region: Secondary | ICD-10-CM | POA: Diagnosis not present

## 2021-05-20 DIAGNOSIS — M4306 Spondylolysis, lumbar region: Secondary | ICD-10-CM | POA: Diagnosis not present

## 2021-05-21 ENCOUNTER — Other Ambulatory Visit: Payer: Self-pay

## 2021-05-21 ENCOUNTER — Ambulatory Visit: Payer: Medicare HMO | Admitting: Dermatology

## 2021-05-21 DIAGNOSIS — M5417 Radiculopathy, lumbosacral region: Secondary | ICD-10-CM | POA: Diagnosis not present

## 2021-05-21 DIAGNOSIS — L821 Other seborrheic keratosis: Secondary | ICD-10-CM | POA: Diagnosis not present

## 2021-05-21 DIAGNOSIS — L578 Other skin changes due to chronic exposure to nonionizing radiation: Secondary | ICD-10-CM

## 2021-05-21 DIAGNOSIS — M9903 Segmental and somatic dysfunction of lumbar region: Secondary | ICD-10-CM | POA: Diagnosis not present

## 2021-05-21 DIAGNOSIS — M4306 Spondylolysis, lumbar region: Secondary | ICD-10-CM | POA: Diagnosis not present

## 2021-05-21 DIAGNOSIS — L820 Inflamed seborrheic keratosis: Secondary | ICD-10-CM | POA: Diagnosis not present

## 2021-05-21 DIAGNOSIS — M9905 Segmental and somatic dysfunction of pelvic region: Secondary | ICD-10-CM | POA: Diagnosis not present

## 2021-05-21 NOTE — Patient Instructions (Addendum)
Prior to procedure, discussed risks of blister formation, small wound, skin dyspigmentation, or rare scar following cryotherapy. Recommend Vaseline ointment to treated areas while healing.     If You Need Anything After Your Visit  If you have any questions or concerns for your doctor, please call our main line at [phone] and press option 4 to reach your doctor's medical assistant. If no one answers, please leave a voicemail as directed and we will return your call as soon as possible. Messages left after 4 pm will be answered the following business day.   You may also send us a message via MyChart. We typically respond to MyChart messages within 1-2 business days.  For prescription refills, please ask your pharmacy to contact our office. Our fax number is [phone].  If you have an urgent issue when the clinic is closed that cannot wait until the next business day, you can page your doctor at the number below.    Please note that while we do our best to be available for urgent issues outside of office hours, we are not available 24/7.   If you have an urgent issue and are unable to reach us, you may choose to seek medical care at your doctor's office, retail clinic, urgent care center, or emergency room.  If you have a medical emergency, please immediately call 911 or go to the emergency department.  Pager Numbers  - Dr. Kowalski: [phone]  - Dr. Moye: [phone]  - Dr. Stewart: [phone]  In the event of inclement weather, please call our main line at [phone] for an update on the status of any delays or closures.  Dermatology Medication Tips: Please keep the boxes that topical medications come in in order to help keep track of the instructions about where and how to use these. Pharmacies typically print the medication instructions only on the boxes and not directly on the medication tubes.   If your medication is too expensive, please contact our office at  [phone] option 4 or send us a message through MyChart.   We are unable to tell what your co-pay for medications will be in advance as this is different depending on your insurance coverage. However, we may be able to find a substitute medication at lower cost or fill out paperwork to get insurance to cover a needed medication.   If a prior authorization is required to get your medication covered by your insurance company, please allow us 1-2 business days to complete this process.  Drug prices often vary depending on where the prescription is filled and some pharmacies may offer cheaper prices.  The website www.goodrx.com contains coupons for medications through different pharmacies. The prices here do not account for what the cost may be with help from insurance (it may be cheaper with your insurance), but the website can give you the price if you did not use any insurance.  - You can print the associated coupon and take it with your prescription to the pharmacy.  - You may also stop by our office during regular business hours and pick up a GoodRx coupon card.  - If you need your prescription sent electronically to a different pharmacy, notify our office through [facility] MyChart or by phone at [phone] option 4.     Si Usted Necesita Algo Despus de Su Visita  Tambin puede enviarnos un mensaje a travs de MyChart. Por lo general respondemos a los mensajes de MyChart en el transcurso de 1 a 2 das   hbiles.  Para renovar recetas, por favor pida a su farmacia que se ponga en contacto con nuestra oficina. Nuestro nmero de fax es el [phone].  Si tiene un asunto urgente cuando la clnica est cerrada y que no puede esperar hasta el siguiente da hbil, puede llamar/localizar a su doctor(a) al nmero que aparece a continuacin.   Por favor, tenga en cuenta que aunque hacemos todo lo posible para estar disponibles para asuntos urgentes fuera del horario de oficina, no estamos  disponibles las 24 horas del da, los 7 das de la semana.   Si tiene un problema urgente y no puede comunicarse con nosotros, puede optar por buscar atencin mdica  en el consultorio de su doctor(a), en una clnica privada, en un centro de atencin urgente o en una sala de emergencias.  Si tiene una emergencia mdica, por favor llame inmediatamente al 911 o vaya a la sala de emergencias.  Nmeros de bper  - Dr. Kowalski: [phone]  - Dra. Moye: [phone]  - Dra. Stewart: [phone]  En caso de inclemencias del tiempo, por favor llame a nuestra lnea principal al [phone] para una actualizacin sobre el estado de cualquier retraso o cierre.  Consejos para la medicacin en dermatologa: Por favor, guarde las cajas en las que vienen los medicamentos de uso tpico para ayudarle a seguir las instrucciones sobre dnde y cmo usarlos. Las farmacias generalmente imprimen las instrucciones del medicamento slo en las cajas y no directamente en los tubos del medicamento.   Si su medicamento es muy caro, por favor, pngase en contacto con nuestra oficina llamando al [phone] y presione la opcin 4 o envenos un mensaje a travs de MyChart.   No podemos decirle cul ser su copago por los medicamentos por adelantado ya que esto es diferente dependiendo de la cobertura de su seguro. Sin embargo, es posible que podamos encontrar un medicamento sustituto a menor costo o llenar un formulario para que el seguro cubra el medicamento que se considera necesario.   Si se requiere una autorizacin previa para que su compaa de seguros cubra su medicamento, por favor permtanos de 1 a 2 das hbiles para completar este proceso.  Los precios de los medicamentos varan con frecuencia dependiendo del lugar de dnde se surte la receta y alguna farmacias pueden ofrecer precios ms baratos.  El sitio web www.goodrx.com tiene cupones para medicamentos de diferentes farmacias. Los precios aqu no  tienen en cuenta lo que podra costar con la ayuda del seguro (puede ser ms barato con su seguro), pero el sitio web puede darle el precio si no utiliz ningn seguro.  - Puede imprimir el cupn correspondiente y llevarlo con su receta a la farmacia.  - Tambin puede pasar por nuestra oficina durante el horario de atencin regular y recoger una tarjeta de cupones de GoodRx.  - Si necesita que su receta se enve electrnicamente a una farmacia diferente, informe a nuestra oficina a travs de MyChart de [facility] o por telfono llamando al [phone] y presione la opcin 4.  

## 2021-05-21 NOTE — Progress Notes (Signed)
° °  Follow-Up Visit   Subjective  Alexander Fitzgerald is a 76 y.o. male who presents for the following: Skin Problem (Check left scalp hx of Aks, ). The patient has spots, moles and lesions to be evaluated, some may be new or changing and the patient has concerns that these could be cancer.  The following portions of the chart were reviewed this encounter and updated as appropriate:   Tobacco   Allergies   Meds   Problems   Med Hx   Surg Hx   Fam Hx      Review of Systems:  No other skin or systemic complaints except as noted in HPI or Assessment and Plan.  Objective  Well appearing patient in no apparent distress; mood and affect are within normal limits.  A focused examination was performed including face,scalp . Relevant physical exam findings are noted in the Assessment and Plan.  left scalp x 1 Stuck-on, waxy, tan-brown papule   Assessment & Plan  Inflamed seborrheic keratosis left scalp x 1  Reassured benign age-related growth.  Recommend observation.  Discussed cryotherapy if spot(s) become irritated or inflamed.   If not gone 4-6 weeks return to the office   Destruction of lesion - left scalp x 1 Complexity: simple   Destruction method: cryotherapy   Informed consent: discussed and consent obtained   Timeout:  patient name, date of birth, surgical site, and procedure verified Lesion destroyed using liquid nitrogen: Yes   Region frozen until ice ball extended beyond lesion: Yes   Outcome: patient tolerated procedure well with no complications   Post-procedure details: wound care instructions given    Actinic Damage - chronic, secondary to cumulative UV radiation exposure/sun exposure over time - diffuse scaly erythematous macules with underlying dyspigmentation - Recommend daily broad spectrum sunscreen SPF 30+ to sun-exposed areas, reapply every 2 hours as needed.  - Recommend staying in the shade or wearing long sleeves, sun glasses (UVA+UVB protection) and wide brim  hats (4-inch brim around the entire circumference of the hat). - Call for new or changing lesions.   Seborrheic Keratoses - Stuck-on, waxy, tan-brown papules and/or plaques  - Benign-appearing - Discussed benign etiology and prognosis. - Observe - Call for any changes   Return for Aks as scheduled .  IMarye Round, CMA, am acting as scribe for Sarina Ser, MD .  Documentation: I have reviewed the above documentation for accuracy and completeness, and I agree with the above.  Sarina Ser, MD

## 2021-05-22 ENCOUNTER — Other Ambulatory Visit: Payer: Self-pay | Admitting: Family Medicine

## 2021-05-24 ENCOUNTER — Encounter: Payer: Self-pay | Admitting: Dermatology

## 2021-05-25 DIAGNOSIS — M9905 Segmental and somatic dysfunction of pelvic region: Secondary | ICD-10-CM | POA: Diagnosis not present

## 2021-05-25 DIAGNOSIS — M5417 Radiculopathy, lumbosacral region: Secondary | ICD-10-CM | POA: Diagnosis not present

## 2021-05-25 DIAGNOSIS — M4306 Spondylolysis, lumbar region: Secondary | ICD-10-CM | POA: Diagnosis not present

## 2021-05-25 DIAGNOSIS — M9903 Segmental and somatic dysfunction of lumbar region: Secondary | ICD-10-CM | POA: Diagnosis not present

## 2021-05-27 DIAGNOSIS — M9905 Segmental and somatic dysfunction of pelvic region: Secondary | ICD-10-CM | POA: Diagnosis not present

## 2021-05-27 DIAGNOSIS — M5417 Radiculopathy, lumbosacral region: Secondary | ICD-10-CM | POA: Diagnosis not present

## 2021-05-27 DIAGNOSIS — M4306 Spondylolysis, lumbar region: Secondary | ICD-10-CM | POA: Diagnosis not present

## 2021-05-27 DIAGNOSIS — M9903 Segmental and somatic dysfunction of lumbar region: Secondary | ICD-10-CM | POA: Diagnosis not present

## 2021-05-28 DIAGNOSIS — M9903 Segmental and somatic dysfunction of lumbar region: Secondary | ICD-10-CM | POA: Diagnosis not present

## 2021-05-28 DIAGNOSIS — M4306 Spondylolysis, lumbar region: Secondary | ICD-10-CM | POA: Diagnosis not present

## 2021-05-28 DIAGNOSIS — M5417 Radiculopathy, lumbosacral region: Secondary | ICD-10-CM | POA: Diagnosis not present

## 2021-05-28 DIAGNOSIS — M9905 Segmental and somatic dysfunction of pelvic region: Secondary | ICD-10-CM | POA: Diagnosis not present

## 2021-05-29 ENCOUNTER — Other Ambulatory Visit: Payer: Self-pay | Admitting: Family Medicine

## 2021-06-01 DIAGNOSIS — M9903 Segmental and somatic dysfunction of lumbar region: Secondary | ICD-10-CM | POA: Diagnosis not present

## 2021-06-01 DIAGNOSIS — M9905 Segmental and somatic dysfunction of pelvic region: Secondary | ICD-10-CM | POA: Diagnosis not present

## 2021-06-01 DIAGNOSIS — M5417 Radiculopathy, lumbosacral region: Secondary | ICD-10-CM | POA: Diagnosis not present

## 2021-06-01 DIAGNOSIS — M4306 Spondylolysis, lumbar region: Secondary | ICD-10-CM | POA: Diagnosis not present

## 2021-06-03 DIAGNOSIS — M9905 Segmental and somatic dysfunction of pelvic region: Secondary | ICD-10-CM | POA: Diagnosis not present

## 2021-06-03 DIAGNOSIS — M4306 Spondylolysis, lumbar region: Secondary | ICD-10-CM | POA: Diagnosis not present

## 2021-06-03 DIAGNOSIS — M5417 Radiculopathy, lumbosacral region: Secondary | ICD-10-CM | POA: Diagnosis not present

## 2021-06-03 DIAGNOSIS — M9903 Segmental and somatic dysfunction of lumbar region: Secondary | ICD-10-CM | POA: Diagnosis not present

## 2021-06-04 DIAGNOSIS — G4733 Obstructive sleep apnea (adult) (pediatric): Secondary | ICD-10-CM | POA: Diagnosis not present

## 2021-06-04 DIAGNOSIS — M5417 Radiculopathy, lumbosacral region: Secondary | ICD-10-CM | POA: Diagnosis not present

## 2021-06-04 DIAGNOSIS — M4306 Spondylolysis, lumbar region: Secondary | ICD-10-CM | POA: Diagnosis not present

## 2021-06-04 DIAGNOSIS — M9905 Segmental and somatic dysfunction of pelvic region: Secondary | ICD-10-CM | POA: Diagnosis not present

## 2021-06-04 DIAGNOSIS — M9903 Segmental and somatic dysfunction of lumbar region: Secondary | ICD-10-CM | POA: Diagnosis not present

## 2021-06-09 DIAGNOSIS — M4306 Spondylolysis, lumbar region: Secondary | ICD-10-CM | POA: Diagnosis not present

## 2021-06-09 DIAGNOSIS — M5417 Radiculopathy, lumbosacral region: Secondary | ICD-10-CM | POA: Diagnosis not present

## 2021-06-09 DIAGNOSIS — M9905 Segmental and somatic dysfunction of pelvic region: Secondary | ICD-10-CM | POA: Diagnosis not present

## 2021-06-09 DIAGNOSIS — M9903 Segmental and somatic dysfunction of lumbar region: Secondary | ICD-10-CM | POA: Diagnosis not present

## 2021-06-11 DIAGNOSIS — M9905 Segmental and somatic dysfunction of pelvic region: Secondary | ICD-10-CM | POA: Diagnosis not present

## 2021-06-11 DIAGNOSIS — M9903 Segmental and somatic dysfunction of lumbar region: Secondary | ICD-10-CM | POA: Diagnosis not present

## 2021-06-11 DIAGNOSIS — M4306 Spondylolysis, lumbar region: Secondary | ICD-10-CM | POA: Diagnosis not present

## 2021-06-11 DIAGNOSIS — M5417 Radiculopathy, lumbosacral region: Secondary | ICD-10-CM | POA: Diagnosis not present

## 2021-06-13 ENCOUNTER — Telehealth: Payer: Medicare HMO | Admitting: Physician Assistant

## 2021-06-13 ENCOUNTER — Encounter: Payer: Self-pay | Admitting: Family Medicine

## 2021-06-13 ENCOUNTER — Telehealth: Payer: Self-pay | Admitting: Cardiology

## 2021-06-13 ENCOUNTER — Encounter: Payer: Self-pay | Admitting: Physician Assistant

## 2021-06-13 DIAGNOSIS — U071 COVID-19: Secondary | ICD-10-CM | POA: Diagnosis not present

## 2021-06-13 MED ORDER — NIRMATRELVIR/RITONAVIR (PAXLOVID)TABLET: 3.0000 | ORAL_TABLET | Freq: Two times a day (BID) | ORAL | 0 refills | Status: AC

## 2021-06-13 NOTE — Telephone Encounter (Signed)
Patient called in reporting he has had cold symptoms and tested positive for COVID today. Requesting Rx. I directed him to the [facility] website and instructed how to obtain a virtual visit through [facility] to determine what he may need. He voiced understanding and thanked me for callback.

## 2021-06-13 NOTE — Progress Notes (Signed)
Virtual Visit Consent   Alexander Fitzgerald, you are scheduled for a virtual visit with a [facility] provider today.     Just as with appointments in the office, your consent must be obtained to participate.  Your consent will be active for this visit and any virtual visit you may have with one of our providers in the next 365 days.     If you have a MyChart account, a copy of this consent can be sent to you electronically.  All virtual visits are billed to your insurance company just like a traditional visit in the office.    As this is a virtual visit, video technology does not allow for your provider to perform a traditional examination.  This may limit your provider's ability to fully assess your condition.  If your provider identifies any concerns that need to be evaluated in person or the need to arrange testing (such as labs, EKG, etc.), we will make arrangements to do so.     Although advances in technology are sophisticated, we cannot ensure that it will always work on either your end or our end.  If the connection with a video visit is poor, the visit may have to be switched to a telephone visit.  With either a video or telephone visit, we are not always able to ensure that we have a secure connection.     I need to obtain your verbal consent now.   Are you willing to proceed with your visit today?    Alexander Fitzgerald has provided verbal consent on 06/13/2021 for a virtual visit (video or telephone).   Kennieth Rad, PA-C   Date: 06/13/2021 4:11 PM   Virtual Visit via Video Note   I, [place], connected with  Alexander Fitzgerald  ([id number], 1945/11/26) on 06/13/21 at  4:00 PM EST by a video-enabled telemedicine application and verified that I am speaking with the correct person using two identifiers.  Location: Patient: Virtual Visit Location Patient: Home Provider: Virtual Visit Location Provider: Home Office   I discussed the limitations of evaluation and management by  telemedicine and the availability of in person appointments. The patient expressed understanding and agreed to proceed.    History of Present Illness: Alexander Fitzgerald is a 76 y.o. who identifies as a male who was assigned male at birth, states that he took a home COVID test approximately 1 and half hours ago which was positive.  States that he had started "feeling achy" yesterday, and this morning started feeling congested.  No known COVID exposure.  Is treated for asthma, states that he feels that he has needed to use his rescue inhaler more often than the last few days.  States that he has been using Allegra-D as well as over-the-counter cold medication with modest relief.  Denies fevers, nausea, vomiting, diarrhea.  Is eating and drinking okay, states he has been able to continue walking his dog.  States that he has had 4 COVID vaccines with his last booster approximately 6 months ago.   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   Asthma 06/10/2016   BPH (benign prostatic hyperplasia) 05/13/2016   Routine general medical examination at a health care facility 05/13/2016   Pure hypercholesterolemia 02/26/2016   Rosacea 11/27/2015   GERD (gastroesophageal reflux disease) 11/27/2015   Arthritis of left knee 08/13/2015   OSA on CPAP 04/15/2014   Obesity (BMI 30-39.9) 04/15/2014   Blepharitis of both eyes 10/12/2011   Dry eye syndrome 10/12/2011   Dry eyes, bilateral  09/29/2011   RBBB (right bundle branch block) 04/23/2011   Hypertension 01/13/2011    Allergies:  Allergies  Allergen Reactions   Sulfa Drugs Cross Reactors     Blood in urine   Medications:  Current Outpatient Medications:    nirmatrelvir/ritonavir EUA (PAXLOVID) 20 x 150 MG & 10 x 100MG  TABS, Take 3 tablets by mouth 2 (two) times daily for 5 days. (Take nirmatrelvir 150 mg two tablets twice daily for 5 days and ritonavir 100 mg one tablet twice daily for 5 days) Patient GFR is 76, Disp: 30 tablet, Rfl: 0   ADVAIR DISKUS 250-50 MCG/ACT AEPB, INHALE 1 PUFF AS NEEDED AS DIRECTED, Disp: 60 each, Rfl: 0   albuterol (VENTOLIN HFA) 108 (90 Base) MCG/ACT inhaler, INHALE 2 PUFFS 4 TIMES A DAY AS NEEDED, Disp: 8.5 each, Rfl: 2   Ascorbic Acid (VITAMIN C) 1000 MG tablet, Take 1,000 mg by mouth daily. Reported on 06/26/2015, Disp: , Rfl:    aspirin 81 MG chewable tablet, Chew by mouth daily., Disp: , Rfl:    Cholecalciferol (VITAMIN D3) 2000 units capsule, Take 4,000 Units by mouth., Disp: , Rfl:    Cyanocobalamin (VITAMIN B-12) 5000 MCG SUBL, Place 2 each under the tongue daily., Disp: , Rfl:    cyclobenzaprine (FLEXERIL) 10 MG tablet, Take 1 tablet (10 mg total) by mouth 3 (three) times daily as needed for muscle spasms., Disp: 30 tablet, Rfl: 0   Dapsone 5 % topical gel, Aczone 5 % topical gel  APPLY A SMALL AMOUNT TO AFFECTED AREA EVERY MORNING, Disp: , Rfl:    docusate sodium (COLACE) 100 MG capsule, Take 100 mg by mouth daily., Disp: , Rfl:    doxycycline (VIBRAMYCIN) 50 MG capsule, TAKE 1 CAPSULE BY MOUTH EVERY DAY WITH FOOD, Disp: 90 capsule, Rfl: 3   doxycycline (VIBRAMYCIN) 50 MG capsule, TAKE 1 CAPSULE BY MOUTH EVERY DAY, Disp: 90 capsule, Rfl: 2   finasteride (PROSCAR) 5 MG tablet, TAKE 1 TABLET BY MOUTH EVERY DAY, Disp: 90 tablet, Rfl: 1   fluticasone (FLONASE) 50 MCG/ACT nasal spray, SPRAY 2 SPRAYS INTO EACH NOSTRIL EVERY DAY, Disp: 48 mL, Rfl: 2   ipratropium (ATROVENT) 0.03 % nasal  spray, Place 2 sprays into both nostrils every 12 (twelve) hours., Disp: 30 mL, Rfl: 12   ketoconazole (NIZORAL) 2 % shampoo, Shampoo from the waist up let sit 5 minutes then wash off. Use twice per week for 2 mths. Then QM there after., Disp: 120 mL, Rfl: 11   levothyroxine (SYNTHROID) 112 MCG tablet, TAKE 1TAB BY MOUTH ONCE DAILY ON EMPTY STOMACH WITH A GLASS OF WATER 30-60MIN BEFORE BREAKFAST, Disp: 90 tablet, Rfl: 3   lisinopril (ZESTRIL) 5 MG tablet, TAKE 1 TABLET BY MOUTH EVERY DAY, Disp: 90 tablet, Rfl: 1   metroNIDAZOLE (METROGEL) 1 % gel, metronidazole 1 % topical gel  APPLY TO AFFECTED AREA EVERY DAY, Disp: , Rfl:    montelukast (SINGULAIR) 10 MG tablet, TAKE 1 TABLET  BY MOUTH EVERYDAY AT BEDTIME, Disp: 90 tablet, Rfl: 1   Multiple Vitamin (MULTIVITAMIN) capsule, Take 1 capsule by mouth daily., Disp: , Rfl:    pantoprazole (PROTONIX) 40 MG tablet, TAKE 1 TABLET BY MOUTH EVERY DAY BEFORE A MEAL AS DIRECTED, Disp: 90 tablet, Rfl: 3   Pyridoxine HCl 200 MG TBCR, Take by mouth., Disp: , Rfl:    torsemide (DEMADEX) 5 MG tablet, TAKE 1 TABLET BY MOUTH EVERY DAY, Disp: 90 tablet, Rfl: 1  Observations/Objective: Patient is well-developed, well-nourished in no acute distress.  Resting comfortably  at home.  Head is normocephalic, atraumatic.  No labored breathing.  Speech is clear and coherent with logical content.  Patient is alert and oriented at baseline.    Assessment and Plan: 1. COVID-19 - nirmatrelvir/ritonavir EUA (PAXLOVID) 20 x 150 MG & 10 x 100MG  TABS; Take 3 tablets by mouth 2 (two) times daily for 5 days. (Take nirmatrelvir 150 mg two tablets twice daily for 5 days and ritonavir 100 mg one tablet twice daily for 5 days) Patient GFR is 76  Dispense: 30 tablet; Refill: 0  Patient is retired Software engineer.  Last GFR was within 6 months, 54.  Patient education given on supportive care, proper isolation guidelines.  Red flags given for prompt reevaluation.  Follow Up Instructions: I  discussed the assessment and treatment plan with the patient. The patient was provided an opportunity to ask questions and all were answered. The patient agreed with the plan and demonstrated an understanding of the instructions.  A copy of instructions were sent to the patient via MyChart unless otherwise noted below.     The patient was advised to call back or seek an in-person evaluation if the symptoms worsen or if the condition fails to improve as anticipated.  Time:  I spent 12 minutes with the patient via telehealth technology discussing the above problems/concerns.    Loraine Grip Mayers, PA-C

## 2021-06-13 NOTE — Patient Instructions (Signed)
Alexander Fitzgerald, thank you for joining Kennieth Rad, PA-C for today's virtual visit.  While this provider is not your primary care provider (PCP), if your PCP is located in our provider database this encounter information will be shared with them immediately following your visit.  Consent: (Patient) Alexander Fitzgerald provided verbal consent for this virtual visit at the beginning of the encounter.  Current Medications:  Current Outpatient Medications:    nirmatrelvir/ritonavir EUA (PAXLOVID) 20 x 150 MG & 10 x 100MG  TABS, Take 3 tablets by mouth 2 (two) times daily for 5 days. (Take nirmatrelvir 150 mg two tablets twice daily for 5 days and ritonavir 100 mg one tablet twice daily for 5 days) Patient GFR is 76, Disp: 30 tablet, Rfl: 0   ADVAIR DISKUS 250-50 MCG/ACT AEPB, INHALE 1 PUFF AS NEEDED AS DIRECTED, Disp: 60 each, Rfl: 0   albuterol (VENTOLIN HFA) 108 (90 Base) MCG/ACT inhaler, INHALE 2 PUFFS 4 TIMES A DAY AS NEEDED, Disp: 8.5 each, Rfl: 2   Ascorbic Acid (VITAMIN C) 1000 MG tablet, Take 1,000 mg by mouth daily. Reported on 06/26/2015, Disp: , Rfl:    aspirin 81 MG chewable tablet, Chew by mouth daily., Disp: , Rfl:    Cholecalciferol (VITAMIN D3) 2000 units capsule, Take 4,000 Units by mouth., Disp: , Rfl:    Cyanocobalamin (VITAMIN B-12) 5000 MCG SUBL, Place 2 each under the tongue daily., Disp: , Rfl:    cyclobenzaprine (FLEXERIL) 10 MG tablet, Take 1 tablet (10 mg total) by mouth 3 (three) times daily as needed for muscle spasms., Disp: 30 tablet, Rfl: 0   Dapsone 5 % topical gel, Aczone 5 % topical gel  APPLY A SMALL AMOUNT TO AFFECTED AREA EVERY MORNING, Disp: , Rfl:    docusate sodium (COLACE) 100 MG capsule, Take 100 mg by mouth daily., Disp: , Rfl:    doxycycline (VIBRAMYCIN) 50 MG capsule, TAKE 1 CAPSULE BY MOUTH EVERY DAY WITH FOOD, Disp: 90 capsule, Rfl: 3   doxycycline (VIBRAMYCIN) 50 MG capsule, TAKE 1 CAPSULE BY MOUTH EVERY DAY, Disp: 90 capsule, Rfl: 2   finasteride  (PROSCAR) 5 MG tablet, TAKE 1 TABLET BY MOUTH EVERY DAY, Disp: 90 tablet, Rfl: 1   fluticasone (FLONASE) 50 MCG/ACT nasal spray, SPRAY 2 SPRAYS INTO EACH NOSTRIL EVERY DAY, Disp: 48 mL, Rfl: 2   ipratropium (ATROVENT) 0.03 % nasal spray, Place 2 sprays into both nostrils every 12 (twelve) hours., Disp: 30 mL, Rfl: 12   ketoconazole (NIZORAL) 2 % shampoo, Shampoo from the waist up let sit 5 minutes then wash off. Use twice per week for 2 mths. Then QM there after., Disp: 120 mL, Rfl: 11   levothyroxine (SYNTHROID) 112 MCG tablet, TAKE 1TAB BY MOUTH ONCE DAILY ON EMPTY STOMACH WITH A GLASS OF WATER 30-60MIN BEFORE BREAKFAST, Disp: 90 tablet, Rfl: 3   lisinopril (ZESTRIL) 5 MG tablet, TAKE 1 TABLET BY MOUTH EVERY DAY, Disp: 90 tablet, Rfl: 1   metroNIDAZOLE (METROGEL) 1 % gel, metronidazole 1 % topical gel  APPLY TO AFFECTED AREA EVERY DAY, Disp: , Rfl:    montelukast (SINGULAIR) 10 MG tablet, TAKE 1 TABLET BY MOUTH EVERYDAY AT BEDTIME, Disp: 90 tablet, Rfl: 1   Multiple Vitamin (MULTIVITAMIN) capsule, Take 1 capsule by mouth daily., Disp: , Rfl:    pantoprazole (PROTONIX) 40 MG tablet, TAKE 1 TABLET BY MOUTH EVERY DAY BEFORE A MEAL AS DIRECTED, Disp: 90 tablet, Rfl: 3   Pyridoxine HCl 200 MG TBCR, Take by mouth.,  Disp: , Rfl:    torsemide (DEMADEX) 5 MG tablet, TAKE 1 TABLET BY MOUTH EVERY DAY, Disp: 90 tablet, Rfl: 1   Medications ordered in this encounter:  Meds ordered this encounter  Medications   nirmatrelvir/ritonavir EUA (PAXLOVID) 20 x 150 MG & 10 x 100MG  TABS    Sig: Take 3 tablets by mouth 2 (two) times daily for 5 days. (Take nirmatrelvir 150 mg two tablets twice daily for 5 days and ritonavir 100 mg one tablet twice daily for 5 days) Patient GFR is 76    Dispense:  30 tablet    Refill:  0    Order Specific Question:   Supervising Provider    Answer:   Noemi Chapel [[id number]]     *If you need refills on other medications prior to your next appointment, please contact your  pharmacy*  Follow-Up: Call back or seek an in-person evaluation if the symptoms worsen or if the condition fails to improve as anticipated.  Other Instructions I do encourage you to make sure you are getting plenty of rest, and drinking lots of fluids.  I hope that you feel better soon, please let us know if there is anything else we can do for you.   If you have been instructed to have an in-person evaluation today at a local Urgent Care facility, please use the link below. It will take you to a list of all of our available [facility], including address, phone number and hours of operation. Please do not delay care.  [facility]  If you or a family member do not have a primary care provider, use the link below to schedule a visit and establish care. When you choose a [facility] primary care physician or advanced practice provider, you gain a long-term partner in health. Find a Primary Care Provider  Learn more about [facility]'s in-office and virtual care options: [facility]   10 Things You Can Do to Manage Your COVID-19 Symptoms at Home If you have possible or confirmed COVID-19 Stay home except to get medical care. Monitor your symptoms carefully. If your symptoms get worse, call your healthcare provider immediately. Get rest and stay hydrated. If you have a medical appointment, call the healthcare provider ahead of time and tell them that you have or may have COVID-19. For medical emergencies, call 911 and notify the dispatch personnel that you have or may have COVID-19. Cover your cough and sneezes with a tissue or use the inside of your elbow. Wash your hands often with soap and water for at least 20 seconds or clean your hands with an alcohol-based hand sanitizer that contains at least 60% alcohol. As much as possible, stay in a specific room and away from other people in your home. Also, you should use a separate bathroom, if available.  If you need to be around other people in or outside of the home, wear a mask. Avoid sharing personal items with other people in your household, like dishes, towels, and bedding. Clean all surfaces that are touched often, like counters, tabletops, and doorknobs. Use household cleaning sprays or wipes according to the label instructions. [email or web address] 11/02/2019 This information is not intended to replace advice given to you by your health care provider. Make sure you discuss any questions you have with your health care provider. Document Revised: 12/26/2020 Document Reviewed: 12/26/2020 Elsevier Patient Education  [employer].

## 2021-06-15 NOTE — Telephone Encounter (Signed)
I called the patient and he was seen by urgent care on Saturday and got the covid medication.  Walker Paddack,cma

## 2021-06-19 ENCOUNTER — Encounter: Payer: Self-pay | Admitting: Family Medicine

## 2021-06-23 DIAGNOSIS — M9903 Segmental and somatic dysfunction of lumbar region: Secondary | ICD-10-CM | POA: Diagnosis not present

## 2021-06-23 DIAGNOSIS — M5417 Radiculopathy, lumbosacral region: Secondary | ICD-10-CM | POA: Diagnosis not present

## 2021-06-23 DIAGNOSIS — M4306 Spondylolysis, lumbar region: Secondary | ICD-10-CM | POA: Diagnosis not present

## 2021-06-23 DIAGNOSIS — M9905 Segmental and somatic dysfunction of pelvic region: Secondary | ICD-10-CM | POA: Diagnosis not present

## 2021-06-25 DIAGNOSIS — M5417 Radiculopathy, lumbosacral region: Secondary | ICD-10-CM | POA: Diagnosis not present

## 2021-06-25 DIAGNOSIS — M4306 Spondylolysis, lumbar region: Secondary | ICD-10-CM | POA: Diagnosis not present

## 2021-06-25 DIAGNOSIS — M9903 Segmental and somatic dysfunction of lumbar region: Secondary | ICD-10-CM | POA: Diagnosis not present

## 2021-06-25 DIAGNOSIS — M9905 Segmental and somatic dysfunction of pelvic region: Secondary | ICD-10-CM | POA: Diagnosis not present

## 2021-06-29 ENCOUNTER — Telehealth (INDEPENDENT_AMBULATORY_CARE_PROVIDER_SITE_OTHER): Payer: Medicare HMO | Admitting: Family Medicine

## 2021-06-29 ENCOUNTER — Other Ambulatory Visit: Payer: Self-pay

## 2021-06-29 ENCOUNTER — Encounter: Payer: Self-pay | Admitting: Family Medicine

## 2021-06-29 DIAGNOSIS — U071 COVID-19: Secondary | ICD-10-CM | POA: Insufficient documentation

## 2021-06-29 DIAGNOSIS — Z9989 Dependence on other enabling machines and devices: Secondary | ICD-10-CM | POA: Diagnosis not present

## 2021-06-29 DIAGNOSIS — I10 Essential (primary) hypertension: Secondary | ICD-10-CM | POA: Diagnosis not present

## 2021-06-29 DIAGNOSIS — G4733 Obstructive sleep apnea (adult) (pediatric): Secondary | ICD-10-CM

## 2021-06-29 DIAGNOSIS — J4530 Mild persistent asthma, uncomplicated: Secondary | ICD-10-CM | POA: Diagnosis not present

## 2021-06-29 MED ORDER — PREDNISONE 20 MG PO TABS: 40.0000 mg | ORAL_TABLET | Freq: Every day | ORAL | 0 refills | Status: DC

## 2021-06-29 NOTE — Assessment & Plan Note (Signed)
Adequately controlled.  The patient will continue lisinopril 5 mg once daily and torsemide 5 mg daily.  Check BMP. ?

## 2021-06-29 NOTE — Assessment & Plan Note (Signed)
Has had ongoing issues with this.  He does have significant allergies to the current allergens in the air.  We will treat with a course of prednisone.  If that does not help with his symptoms he is already seeing pulmonology and they can adjust his inhaler regimen as needed. ?

## 2021-06-29 NOTE — Assessment & Plan Note (Signed)
The patient seems to have fully recovered at this time.  He does not appear to have had any Paxlovid rebound symptoms.  Discussed there is no reason for him to continue to test for COVID.  He will monitor for any recurrence of symptoms. ?

## 2021-06-29 NOTE — Progress Notes (Signed)
? ?Virtual Visit via video Note ? ?This visit type was conducted due to national recommendations for restrictions regarding the COVID-19 pandemic (e.g. social distancing).  This format is felt to be most appropriate for this patient at this time.  All issues noted in this document were discussed and addressed.  No physical exam was performed (except for noted visual exam findings with Video Visits).  ? ?I connected with Alexander Fitzgerald today at 10:30 AM EDT by a video enabled telemedicine application and verified that I am speaking with the correct person using two identifiers. ?Location patient: home ?Location provider: work  ?Persons participating in the virtual visit: patient, provider ? ?I discussed the limitations, risks, security and privacy concerns of performing an evaluation and management service by telephone and the availability of in person appointments. I also discussed with the patient that there may be a patient responsible charge related to this service. The patient expressed understanding and agreed to proceed. ? ? ?Reason for visit: f/u covid ? ?HPI: ?COVID-19: Patient notes his symptoms started 16-17 days ago.  He started to feel achy and then had cold symptoms.  He started on Paxlovid and felt progressively better by the end of that.  He did have diarrhea after finishing the Paxlovid though that resolved.  Over the last 3 to 4 days he feels back to his baseline.  He reports he took a COVID test yesterday and was still positive.  He never had any Paxlovid rebound symptoms. ? ?Asthma: Patient notes this has been worse this spring.  Initially he was doing 2 puffs twice daily of Advair and using his albuterol as needed.  He has backed off to 1 puff twice daily of Advair.  He has an appointment with pulmonology next week. ? ?OSA: He uses CPAP nightly.  He notes he feels significantly better when wearing it.  It does help his sleep significantly. ? ?Hypertension: He reports his BPs have been 120s  over 80s.  He is on lisinopril. ? ? ?ROS: See pertinent positives and negatives per HPI. ? ?Past Medical History:  ?Diagnosis Date  ? Asthma   ? adult onset, PFTs done in [place]  ? Cancer [facility])   ? Several moles removed in past, all pre-cancerous.  Recently followed by Dr. Nehemiah Massed  ? Depression   ? Treated with Prozac in 1992,  Associate with job loss  ? Esophageal reflux   ? Gout   ? Hiatal hernia   ? Hypertension   ? Squamous cell carcinoma of skin 07/30/2015  ? R anterior lat neck, Atypical squamous proliferation, excised 08/28/2015  ? ? ?Past Surgical History:  ?Procedure Laterality Date  ? APPENDECTOMY    ? COLONOSCOPY N/A 12/27/2014  ? Procedure: COLONOSCOPY;  Surgeon: Lollie Sails, MD;  Location: [facility] ENDOSCOPY;  Service: Endoscopy;  Laterality: N/A;  ? LAPAROSCOPIC PARAESOPHAGEAL HERNIA REPAIR    ? TONSILLECTOMY    ? ? ?Family History  ?Problem Relation Age of Onset  ? Hypertension Mother   ? Hyperlipidemia Mother   ? Diabetes Mother   ? Heart attack Father 27  ?     MI  ? Hypertension Father   ? Cancer Sister   ?     pancreatic  ? ? ?SOCIAL HX: Smoker ? ? ?Current Outpatient Medications:  ?  ADVAIR DISKUS 250-50 MCG/ACT AEPB, INHALE 1 PUFF AS NEEDED AS DIRECTED, Disp: 60 each, Rfl: 0 ?  albuterol (VENTOLIN HFA) 108 (90 Base) MCG/ACT inhaler, INHALE 2 PUFFS 4 TIMES A  DAY AS NEEDED, Disp: 8.5 each, Rfl: 2 ?  Ascorbic Acid (VITAMIN C) 1000 MG tablet, Take 1,000 mg by mouth daily. Reported on 06/26/2015, Disp: , Rfl:  ?  Cholecalciferol (VITAMIN D3) 2000 units capsule, Take 4,000 Units by mouth., Disp: , Rfl:  ?  Cyanocobalamin (VITAMIN B-12) 5000 MCG SUBL, Place 2 each under the tongue daily., Disp: , Rfl:  ?  cyclobenzaprine (FLEXERIL) 10 MG tablet, Take 1 tablet (10 mg total) by mouth 3 (three) times daily as needed for muscle spasms., Disp: 30 tablet, Rfl: 0 ?  Dapsone 5 % topical gel, Aczone 5 % topical gel  APPLY A SMALL AMOUNT TO AFFECTED AREA EVERY MORNING, Disp: , Rfl:  ?  docusate sodium (COLACE) 100 MG  capsule, Take 100 mg by mouth daily., Disp: , Rfl:  ?  doxycycline (VIBRAMYCIN) 50 MG capsule, TAKE 1 CAPSULE BY MOUTH EVERY DAY, Disp: 90 capsule, Rfl: 2 ?  finasteride (PROSCAR) 5 MG tablet, TAKE 1 TABLET BY MOUTH EVERY DAY, Disp: 90 tablet, Rfl: 1 ?  fluticasone (FLONASE) 50 MCG/ACT nasal spray, SPRAY 2 SPRAYS INTO EACH NOSTRIL EVERY DAY, Disp: 48 mL, Rfl: 2 ?  ipratropium (ATROVENT) 0.03 % nasal spray, Place 2 sprays into both nostrils every 12 (twelve) hours., Disp: 30 mL, Rfl: 12 ?  ketoconazole (NIZORAL) 2 % shampoo, Shampoo from the waist up let sit 5 minutes then wash off. Use twice per week for 2 mths. Then QM there after., Disp: 120 mL, Rfl: 11 ?  levothyroxine (SYNTHROID) 112 MCG tablet, TAKE 1TAB BY MOUTH ONCE DAILY ON EMPTY STOMACH WITH A GLASS OF WATER 30-60MIN BEFORE BREAKFAST, Disp: 90 tablet, Rfl: 3 ?  lisinopril (ZESTRIL) 5 MG tablet, TAKE 1 TABLET BY MOUTH EVERY DAY, Disp: 90 tablet, Rfl: 1 ?  metroNIDAZOLE (METROGEL) 1 % gel, metronidazole 1 % topical gel  APPLY TO AFFECTED AREA EVERY DAY, Disp: , Rfl:  ?  montelukast (SINGULAIR) 10 MG tablet, TAKE 1 TABLET BY MOUTH EVERYDAY AT BEDTIME, Disp: 90 tablet, Rfl: 1 ?  Multiple Vitamin (MULTIVITAMIN) capsule, Take 1 capsule by mouth daily., Disp: , Rfl:  ?  pantoprazole (PROTONIX) 40 MG tablet, TAKE 1 TABLET BY MOUTH EVERY DAY BEFORE A MEAL AS DIRECTED, Disp: 90 tablet, Rfl: 3 ?  predniSONE (DELTASONE) 20 MG tablet, Take 2 tablets (40 mg total) by mouth daily with breakfast., Disp: 10 tablet, Rfl: 0 ?  Pyridoxine HCl 200 MG TBCR, Take by mouth., Disp: , Rfl:  ?  torsemide (DEMADEX) 5 MG tablet, TAKE 1 TABLET BY MOUTH EVERY DAY, Disp: 90 tablet, Rfl: 1 ?  oxyCODONE (OXY IR/ROXICODONE) 5 MG immediate release tablet, oxycodone 5 mg tablet, Disp: , Rfl:  ? ?EXAM: ? ?VITALS per patient if applicable: ? ?GENERAL: alert, oriented, appears well and in no acute distress ? ?HEENT: atraumatic, conjunttiva clear, no obvious abnormalities on inspection of external  nose and ears ? ?NECK: normal movements of the head and neck ? ?LUNGS: on inspection no signs of respiratory distress, breathing rate appears normal, no obvious gross SOB, gasping or wheezing ? ?CV: no obvious cyanosis ? ?MS: moves all visible extremities without noticeable abnormality ? ?PSYCH/NEURO: pleasant and cooperative, no obvious depression or anxiety, speech and thought processing grossly intact ? ?ASSESSMENT AND PLAN: ? ?Discussed the following assessment and plan: ? ?Problem List Items Addressed This Visit   ? ? Asthma  ?  Has had ongoing issues with this.  He does have significant allergies to the current allergens in the  air.  We will treat with a course of prednisone.  If that does not help with his symptoms he is already seeing pulmonology and they can adjust his inhaler regimen as needed. ?  ?  ? Relevant Medications  ? predniSONE (DELTASONE) 20 MG tablet  ? COVID-19  ?  The patient seems to have fully recovered at this time.  He does not appear to have had any Paxlovid rebound symptoms.  Discussed there is no reason for him to continue to test for COVID.  He will monitor for any recurrence of symptoms. ?  ?  ? Hypertension  ?  Adequately controlled.  The patient will continue lisinopril 5 mg once daily and torsemide 5 mg daily.  Check BMP. ?  ?  ? Relevant Orders  ? Basic Metabolic Panel (BMET)  ? OSA on CPAP  ?  Symptomatically well controlled.  He will continue his CPAP. ?  ?  ? ? ?Return in about 1 week (around 07/06/2021) for Labs, 6 months PCP. ?  ?I discussed the assessment and treatment plan with the patient. The patient was provided an opportunity to ask questions and all were answered. The patient agreed with the plan and demonstrated an understanding of the instructions. ?  ?The patient was advised to call back or seek an in-person evaluation if the symptoms worsen or if the condition fails to improve as anticipated. ? ? ?[name], MD  ? ?

## 2021-06-29 NOTE — Assessment & Plan Note (Signed)
Symptomatically well controlled.  He will continue his CPAP. ?

## 2021-06-30 DIAGNOSIS — M9905 Segmental and somatic dysfunction of pelvic region: Secondary | ICD-10-CM | POA: Diagnosis not present

## 2021-06-30 DIAGNOSIS — M9903 Segmental and somatic dysfunction of lumbar region: Secondary | ICD-10-CM | POA: Diagnosis not present

## 2021-06-30 DIAGNOSIS — M5417 Radiculopathy, lumbosacral region: Secondary | ICD-10-CM | POA: Diagnosis not present

## 2021-06-30 DIAGNOSIS — M4306 Spondylolysis, lumbar region: Secondary | ICD-10-CM | POA: Diagnosis not present

## 2021-07-02 DIAGNOSIS — G4733 Obstructive sleep apnea (adult) (pediatric): Secondary | ICD-10-CM | POA: Diagnosis not present

## 2021-07-02 DIAGNOSIS — M5417 Radiculopathy, lumbosacral region: Secondary | ICD-10-CM | POA: Diagnosis not present

## 2021-07-02 DIAGNOSIS — M9903 Segmental and somatic dysfunction of lumbar region: Secondary | ICD-10-CM | POA: Diagnosis not present

## 2021-07-02 DIAGNOSIS — M4306 Spondylolysis, lumbar region: Secondary | ICD-10-CM | POA: Diagnosis not present

## 2021-07-02 DIAGNOSIS — M9905 Segmental and somatic dysfunction of pelvic region: Secondary | ICD-10-CM | POA: Diagnosis not present

## 2021-07-03 ENCOUNTER — Other Ambulatory Visit: Payer: Self-pay

## 2021-07-03 ENCOUNTER — Other Ambulatory Visit (INDEPENDENT_AMBULATORY_CARE_PROVIDER_SITE_OTHER): Payer: Medicare HMO

## 2021-07-03 ENCOUNTER — Encounter: Payer: Self-pay | Admitting: Family Medicine

## 2021-07-03 DIAGNOSIS — I10 Essential (primary) hypertension: Secondary | ICD-10-CM | POA: Diagnosis not present

## 2021-07-03 LAB — BASIC METABOLIC PANEL
BUN: 18 mg/dL (ref 6–23)
CO2: 27 mEq/L (ref 19–32)
Calcium: 9.8 mg/dL (ref 8.4–10.5)
Chloride: 101 mEq/L (ref 96–112)
Creatinine, Ser: 1.17 mg/dL (ref 0.40–1.50)
GFR: 61.24 mL/min (ref >60.00)
Glucose, Bld: 87 mg/dL (ref 70–99)
Potassium: 4.2 mEq/L (ref 3.5–5.1)
Sodium: 138 mEq/L (ref 135–145)

## 2021-07-06 DIAGNOSIS — J4530 Mild persistent asthma, uncomplicated: Secondary | ICD-10-CM | POA: Diagnosis not present

## 2021-07-06 DIAGNOSIS — M9905 Segmental and somatic dysfunction of pelvic region: Secondary | ICD-10-CM | POA: Diagnosis not present

## 2021-07-06 DIAGNOSIS — M5417 Radiculopathy, lumbosacral region: Secondary | ICD-10-CM | POA: Diagnosis not present

## 2021-07-06 DIAGNOSIS — M4306 Spondylolysis, lumbar region: Secondary | ICD-10-CM | POA: Diagnosis not present

## 2021-07-06 DIAGNOSIS — J4540 Moderate persistent asthma, uncomplicated: Secondary | ICD-10-CM | POA: Diagnosis not present

## 2021-07-06 DIAGNOSIS — M9903 Segmental and somatic dysfunction of lumbar region: Secondary | ICD-10-CM | POA: Diagnosis not present

## 2021-07-14 DIAGNOSIS — M9903 Segmental and somatic dysfunction of lumbar region: Secondary | ICD-10-CM | POA: Diagnosis not present

## 2021-07-14 DIAGNOSIS — M9905 Segmental and somatic dysfunction of pelvic region: Secondary | ICD-10-CM | POA: Diagnosis not present

## 2021-07-14 DIAGNOSIS — M4306 Spondylolysis, lumbar region: Secondary | ICD-10-CM | POA: Diagnosis not present

## 2021-07-14 DIAGNOSIS — M5417 Radiculopathy, lumbosacral region: Secondary | ICD-10-CM | POA: Diagnosis not present

## 2021-07-18 ENCOUNTER — Other Ambulatory Visit: Payer: Self-pay | Admitting: Dermatology

## 2021-07-18 ENCOUNTER — Other Ambulatory Visit: Payer: Self-pay | Admitting: Family Medicine

## 2021-07-21 DIAGNOSIS — M4306 Spondylolysis, lumbar region: Secondary | ICD-10-CM | POA: Diagnosis not present

## 2021-07-21 DIAGNOSIS — M9903 Segmental and somatic dysfunction of lumbar region: Secondary | ICD-10-CM | POA: Diagnosis not present

## 2021-07-21 DIAGNOSIS — M9905 Segmental and somatic dysfunction of pelvic region: Secondary | ICD-10-CM | POA: Diagnosis not present

## 2021-07-21 DIAGNOSIS — M5417 Radiculopathy, lumbosacral region: Secondary | ICD-10-CM | POA: Diagnosis not present

## 2021-07-28 DIAGNOSIS — M4306 Spondylolysis, lumbar region: Secondary | ICD-10-CM | POA: Diagnosis not present

## 2021-07-28 DIAGNOSIS — M9903 Segmental and somatic dysfunction of lumbar region: Secondary | ICD-10-CM | POA: Diagnosis not present

## 2021-07-28 DIAGNOSIS — M5417 Radiculopathy, lumbosacral region: Secondary | ICD-10-CM | POA: Diagnosis not present

## 2021-07-28 DIAGNOSIS — M9905 Segmental and somatic dysfunction of pelvic region: Secondary | ICD-10-CM | POA: Diagnosis not present

## 2021-08-02 DIAGNOSIS — G4733 Obstructive sleep apnea (adult) (pediatric): Secondary | ICD-10-CM | POA: Diagnosis not present

## 2021-08-11 DIAGNOSIS — M5417 Radiculopathy, lumbosacral region: Secondary | ICD-10-CM | POA: Diagnosis not present

## 2021-08-11 DIAGNOSIS — M4306 Spondylolysis, lumbar region: Secondary | ICD-10-CM | POA: Diagnosis not present

## 2021-08-11 DIAGNOSIS — M9903 Segmental and somatic dysfunction of lumbar region: Secondary | ICD-10-CM | POA: Diagnosis not present

## 2021-08-11 DIAGNOSIS — M9905 Segmental and somatic dysfunction of pelvic region: Secondary | ICD-10-CM | POA: Diagnosis not present

## 2021-08-27 DIAGNOSIS — M4306 Spondylolysis, lumbar region: Secondary | ICD-10-CM | POA: Diagnosis not present

## 2021-08-27 DIAGNOSIS — M9903 Segmental and somatic dysfunction of lumbar region: Secondary | ICD-10-CM | POA: Diagnosis not present

## 2021-08-27 DIAGNOSIS — M5417 Radiculopathy, lumbosacral region: Secondary | ICD-10-CM | POA: Diagnosis not present

## 2021-08-27 DIAGNOSIS — M9905 Segmental and somatic dysfunction of pelvic region: Secondary | ICD-10-CM | POA: Diagnosis not present

## 2021-09-01 DIAGNOSIS — G4733 Obstructive sleep apnea (adult) (pediatric): Secondary | ICD-10-CM | POA: Diagnosis not present

## 2021-09-10 ENCOUNTER — Ambulatory Visit: Payer: Medicare HMO | Admitting: Dermatology

## 2021-09-10 DIAGNOSIS — D692 Other nonthrombocytopenic purpura: Secondary | ICD-10-CM

## 2021-09-10 DIAGNOSIS — L814 Other melanin hyperpigmentation: Secondary | ICD-10-CM | POA: Diagnosis not present

## 2021-09-10 DIAGNOSIS — D229 Melanocytic nevi, unspecified: Secondary | ICD-10-CM | POA: Diagnosis not present

## 2021-09-10 DIAGNOSIS — L578 Other skin changes due to chronic exposure to nonionizing radiation: Secondary | ICD-10-CM

## 2021-09-10 DIAGNOSIS — L821 Other seborrheic keratosis: Secondary | ICD-10-CM | POA: Diagnosis not present

## 2021-09-10 DIAGNOSIS — D1800 Hemangioma unspecified site: Secondary | ICD-10-CM

## 2021-09-10 DIAGNOSIS — S20451A Superficial foreign body of right back wall of thorax, initial encounter: Secondary | ICD-10-CM | POA: Diagnosis not present

## 2021-09-10 DIAGNOSIS — W57XXXA Bitten or stung by nonvenomous insect and other nonvenomous arthropods, initial encounter: Secondary | ICD-10-CM | POA: Diagnosis not present

## 2021-09-10 DIAGNOSIS — S40261A Insect bite (nonvenomous) of right shoulder, initial encounter: Secondary | ICD-10-CM

## 2021-09-10 DIAGNOSIS — S20461A Insect bite (nonvenomous) of right back wall of thorax, initial encounter: Secondary | ICD-10-CM | POA: Diagnosis not present

## 2021-09-10 DIAGNOSIS — L820 Inflamed seborrheic keratosis: Secondary | ICD-10-CM

## 2021-09-10 DIAGNOSIS — L570 Actinic keratosis: Secondary | ICD-10-CM

## 2021-09-10 DIAGNOSIS — Z1283 Encounter for screening for malignant neoplasm of skin: Secondary | ICD-10-CM | POA: Diagnosis not present

## 2021-09-10 MED ORDER — DOXYCYCLINE MONOHYDRATE 100 MG PO CAPS: 100.0000 mg | ORAL_CAPSULE | Freq: Two times a day (BID) | ORAL | 0 refills | Status: AC

## 2021-09-10 NOTE — Progress Notes (Signed)
Follow-Up Visit   Subjective  Alexander Fitzgerald is a 76 y.o. male who presents for the following: Actinic Keratosis (Face, 63mf/u), ISK f/u (L scalp, pt thinks not resolved), and Skin Tag (R post shoulder, pt thinks he pulled off). The patient presents for Upper Body Skin Exam (UBSE) for skin cancer screening and mole check.  The patient has spots, moles and lesions to be evaluated, some may be new or changing and the patient has concerns that these could be cancer.   The following portions of the chart were reviewed this encounter and updated as appropriate:   Tobacco  Allergies  Meds  Problems  Med Hx  Surg Hx  Fam Hx     Review of Systems:  No other skin or systemic complaints except as noted in HPI or Assessment and Plan.  Objective  Well appearing patient in no apparent distress; mood and affect are within normal limits.  All skin waist up examined.  face, ears, scalp x 16 (16) Pink scaly macules  L scalp x 1 Residual stuck on waxy paps with erythema   Assessment & Plan   Lentigines - Scattered tan macules - Due to sun exposure - Benign-appearing, observe - Recommend daily broad spectrum sunscreen SPF 30+ to sun-exposed areas, reapply every 2 hours as needed. - Call for any changes  Seborrheic Keratoses - Stuck-on, waxy, tan-brown papules and/or plaques  - Benign-appearing - Discussed benign etiology and prognosis. - Observe - Call for any changes  Melanocytic Nevi - Tan-brown and/or pink-flesh-colored symmetric macules and papules - Benign appearing on exam today - Observation - Call clinic for new or changing moles - Recommend daily use of broad spectrum spf 30+ sunscreen to sun-exposed areas.   Hemangiomas - Red papules - Discussed benign nature - Observe - Call for any changes  Actinic Damage - Chronic condition, secondary to cumulative UV/sun exposure - diffuse scaly erythematous macules with underlying dyspigmentation - Recommend daily broad  spectrum sunscreen SPF 30+ to sun-exposed areas, reapply every 2 hours as needed.  - Staying in the shade or wearing long sleeves, sun glasses (UVA+UVB protection) and wide brim hats (4-inch brim around the entire circumference of the hat) are also recommended for sun protection.  - Call for new or changing lesions.  Skin cancer screening performed today.  Purpura - Chronic; persistent and recurrent.  Treatable, but not curable. - Violaceous macules and patches - Benign - Related to trauma, age, sun damage and/or use of blood thinners, chronic use of topical and/or oral steroids - Observe - Can use OTC arnica containing moisturizer such as Dermend Bruise Formula if desired - Call for worsening or other concerns  AK (actinic keratosis) (16) face, ears, scalp x 16 Destruction of lesion - face, ears, scalp x 16 Complexity: simple   Destruction method: cryotherapy   Informed consent: discussed and consent obtained   Timeout:  patient name, date of birth, surgical site, and procedure verified Lesion destroyed using liquid nitrogen: Yes   Region frozen until ice ball extended beyond lesion: Yes   Outcome: patient tolerated procedure well with no complications   Post-procedure details: wound care instructions given    Inflamed seborrheic keratosis L scalp x 1 Symptomatic, irritating, patient would like treated. Destruction of lesion - L scalp x 1 Complexity: simple   Destruction method: cryotherapy   Informed consent: discussed and consent obtained   Timeout:  patient name, date of birth, surgical site, and procedure verified Lesion destroyed using liquid nitrogen:  Yes   Region frozen until ice ball extended beyond lesion: Yes   Outcome: patient tolerated procedure well with no complications   Post-procedure details: wound care instructions given    Insect bite of right shoulder, initial encounter Tick bite, unspecified site, initial encounter Foreign body removal performed. Right  Upper Back Tick bite Foreign body (tick body/mouth part) removal today Start Doxycycline '100mg'$  1 po bid for 7 days  doxycycline (MONODOX) 100 MG capsule - Right Upper Back Take 1 capsule (100 mg total) by mouth 2 (two) times daily for 7 days. Take with food and drink  Skin cancer screening  Return in about 6 months (around 03/13/2022) for AK f/u.  I, [name], RMA, am acting as scribe for Alexander Ser, MD . Documentation: I have reviewed the above documentation for accuracy and completeness, and I agree with the above.  Alexander Ser, MD

## 2021-09-10 NOTE — Patient Instructions (Addendum)

## 2021-09-14 ENCOUNTER — Encounter: Payer: Self-pay | Admitting: Dermatology

## 2021-09-16 DIAGNOSIS — M1611 Unilateral primary osteoarthritis, right hip: Secondary | ICD-10-CM | POA: Diagnosis not present

## 2021-09-16 DIAGNOSIS — M25551 Pain in right hip: Secondary | ICD-10-CM | POA: Diagnosis not present

## 2021-09-24 DIAGNOSIS — M9905 Segmental and somatic dysfunction of pelvic region: Secondary | ICD-10-CM | POA: Diagnosis not present

## 2021-09-24 DIAGNOSIS — M4306 Spondylolysis, lumbar region: Secondary | ICD-10-CM | POA: Diagnosis not present

## 2021-09-24 DIAGNOSIS — M5417 Radiculopathy, lumbosacral region: Secondary | ICD-10-CM | POA: Diagnosis not present

## 2021-09-24 DIAGNOSIS — M9903 Segmental and somatic dysfunction of lumbar region: Secondary | ICD-10-CM | POA: Diagnosis not present

## 2021-10-02 DIAGNOSIS — G4733 Obstructive sleep apnea (adult) (pediatric): Secondary | ICD-10-CM | POA: Diagnosis not present

## 2021-10-05 DIAGNOSIS — M1711 Unilateral primary osteoarthritis, right knee: Secondary | ICD-10-CM | POA: Diagnosis not present

## 2021-10-06 DIAGNOSIS — J4530 Mild persistent asthma, uncomplicated: Secondary | ICD-10-CM | POA: Diagnosis not present

## 2021-10-13 DIAGNOSIS — M4306 Spondylolysis, lumbar region: Secondary | ICD-10-CM | POA: Diagnosis not present

## 2021-10-13 DIAGNOSIS — M9905 Segmental and somatic dysfunction of pelvic region: Secondary | ICD-10-CM | POA: Diagnosis not present

## 2021-10-13 DIAGNOSIS — M9903 Segmental and somatic dysfunction of lumbar region: Secondary | ICD-10-CM | POA: Diagnosis not present

## 2021-10-13 DIAGNOSIS — M5417 Radiculopathy, lumbosacral region: Secondary | ICD-10-CM | POA: Diagnosis not present

## 2021-10-22 ENCOUNTER — Other Ambulatory Visit: Payer: Self-pay | Admitting: Family Medicine

## 2021-11-01 DIAGNOSIS — G4733 Obstructive sleep apnea (adult) (pediatric): Secondary | ICD-10-CM | POA: Diagnosis not present

## 2021-11-05 DIAGNOSIS — M4306 Spondylolysis, lumbar region: Secondary | ICD-10-CM | POA: Diagnosis not present

## 2021-11-05 DIAGNOSIS — M5417 Radiculopathy, lumbosacral region: Secondary | ICD-10-CM | POA: Diagnosis not present

## 2021-11-05 DIAGNOSIS — M9903 Segmental and somatic dysfunction of lumbar region: Secondary | ICD-10-CM | POA: Diagnosis not present

## 2021-11-05 DIAGNOSIS — M9905 Segmental and somatic dysfunction of pelvic region: Secondary | ICD-10-CM | POA: Diagnosis not present

## 2021-11-09 ENCOUNTER — Other Ambulatory Visit: Payer: Self-pay | Admitting: Family Medicine

## 2021-11-12 ENCOUNTER — Ambulatory Visit: Payer: Medicare HMO | Admitting: Podiatry

## 2021-11-12 DIAGNOSIS — M79675 Pain in left toe(s): Secondary | ICD-10-CM

## 2021-11-12 DIAGNOSIS — M79674 Pain in right toe(s): Secondary | ICD-10-CM | POA: Diagnosis not present

## 2021-11-12 DIAGNOSIS — B351 Tinea unguium: Secondary | ICD-10-CM

## 2021-11-12 NOTE — Progress Notes (Signed)
  Subjective:  Patient ID: Alexander Fitzgerald, male    DOB: 1945/10/08,  MRN: [id number]  Chief Complaint  Patient presents with   Nail Problem   76 y.o. male returns for the above complaint.  Patient presents with thickened negative dystrophic toenails x10 mild pain on palpation.  Patient states is painful to touch she would like to have it debrided and he is not able to do it himself  Objective:  There were no vitals filed for this visit. Podiatric Exam: Vascular: dorsalis pedis and posterior tibial pulses are palpable bilateral. Capillary return is immediate. Temperature gradient is WNL. Skin turgor WNL  Sensorium: Normal Semmes Weinstein monofilament test. Normal tactile sensation bilaterally. Nail Exam: Pt has thick disfigured discolored nails with subungual debris noted bilateral entire nail hallux through fifth toenails.  Pain on palpation to the nails. Ulcer Exam: There is no evidence of ulcer or pre-ulcerative changes or infection. Orthopedic Exam: Muscle tone and strength are WNL. No limitations in general ROM. No crepitus or effusions noted.  Skin: No Porokeratosis. No infection or ulcers    Assessment & Plan:   1. Pain due to onychomycosis of toenails of both feet     Patient was evaluated and treated and all questions answered.  Onychomycosis with pain  -Nails palliatively debrided as below. -Educated on self-care  Procedure: Nail Debridement Rationale: pain  Type of Debridement: manual, sharp debridement. Instrumentation: Nail nipper, rotary burr. Number of Nails: 10  Procedures and Treatment: Consent by patient was obtained for treatment procedures. The patient understood the discussion of treatment and procedures well. All questions were answered thoroughly reviewed. Debridement of mycotic and hypertrophic toenails, 1 through 5 bilateral and clearing of subungual debris. No ulceration, no infection noted.  Return Visit-Office Procedure: Patient instructed to  return to the office for a follow up visit 3 months for continued evaluation and treatment.  Boneta Lucks, DPM    No follow-ups on file.

## 2021-11-18 DIAGNOSIS — M4306 Spondylolysis, lumbar region: Secondary | ICD-10-CM | POA: Diagnosis not present

## 2021-11-18 DIAGNOSIS — M9905 Segmental and somatic dysfunction of pelvic region: Secondary | ICD-10-CM | POA: Diagnosis not present

## 2021-11-18 DIAGNOSIS — M9903 Segmental and somatic dysfunction of lumbar region: Secondary | ICD-10-CM | POA: Diagnosis not present

## 2021-11-18 DIAGNOSIS — M5417 Radiculopathy, lumbosacral region: Secondary | ICD-10-CM | POA: Diagnosis not present

## 2021-11-25 ENCOUNTER — Other Ambulatory Visit: Payer: Self-pay | Admitting: Family Medicine

## 2021-11-30 DIAGNOSIS — M5417 Radiculopathy, lumbosacral region: Secondary | ICD-10-CM | POA: Diagnosis not present

## 2021-11-30 DIAGNOSIS — M9903 Segmental and somatic dysfunction of lumbar region: Secondary | ICD-10-CM | POA: Diagnosis not present

## 2021-11-30 DIAGNOSIS — M4306 Spondylolysis, lumbar region: Secondary | ICD-10-CM | POA: Diagnosis not present

## 2021-11-30 DIAGNOSIS — M9905 Segmental and somatic dysfunction of pelvic region: Secondary | ICD-10-CM | POA: Diagnosis not present

## 2021-12-02 DIAGNOSIS — G4733 Obstructive sleep apnea (adult) (pediatric): Secondary | ICD-10-CM | POA: Diagnosis not present

## 2021-12-09 ENCOUNTER — Ambulatory Visit (INDEPENDENT_AMBULATORY_CARE_PROVIDER_SITE_OTHER): Payer: Medicare HMO | Admitting: Family Medicine

## 2021-12-09 ENCOUNTER — Encounter: Payer: Self-pay | Admitting: Family Medicine

## 2021-12-09 VITALS — BP 130/80 | HR 99 | Temp 99.1°F | Ht 69.0 in | Wt 213.2 lb

## 2021-12-09 DIAGNOSIS — B9789 Other viral agents as the cause of diseases classified elsewhere: Secondary | ICD-10-CM | POA: Diagnosis not present

## 2021-12-09 DIAGNOSIS — H6121 Impacted cerumen, right ear: Secondary | ICD-10-CM

## 2021-12-09 DIAGNOSIS — J988 Other specified respiratory disorders: Secondary | ICD-10-CM | POA: Diagnosis not present

## 2021-12-09 DIAGNOSIS — J029 Acute pharyngitis, unspecified: Secondary | ICD-10-CM

## 2021-12-09 DIAGNOSIS — Z23 Encounter for immunization: Secondary | ICD-10-CM

## 2021-12-09 DIAGNOSIS — H6120 Impacted cerumen, unspecified ear: Secondary | ICD-10-CM | POA: Insufficient documentation

## 2021-12-09 LAB — POCT RAPID STREP A (OFFICE): Rapid Strep A Screen: NEGATIVE

## 2021-12-09 NOTE — Progress Notes (Signed)
[name], MD Phone: [phone]  Alexander Fitzgerald is a 76 y.o. male who presents today for f/u.  Sore throat: Patient notes onset of symptoms 5 days ago.  He was around family that had an illness.  He notes a left earache.  He had some cough starting this morning that is productive of clear mucus.  No fevers.  No change to chronic breathing issues.  No taste or smell disturbances.  Some postnasal drip.  He took Allegra-D this morning about an hour ago.  He is unsure if its been helpful yet.  He had a negative COVID test 2 days ago.  No known COVID exposures.  Social History   Tobacco Use  Smoking Status Former   Packs/day: 0.50   Years: 20.00   Total pack years: 10.00   Types: Cigarettes   Quit date: 06/15/1999   Years since quitting: 22.5  Smokeless Tobacco Never    Current Outpatient Medications on File Prior to Visit  Medication Sig Dispense Refill   ADVAIR DISKUS 250-50 MCG/ACT AEPB INHALE 1 PUFF AS NEEDED AS DIRECTED 60 each 0   albuterol (VENTOLIN HFA) 108 (90 Base) MCG/ACT inhaler INHALE 2 PUFFS 4 TIMES A DAY AS NEEDED 8.5 each 2   Ascorbic Acid (VITAMIN C) 1000 MG tablet Take 1,000 mg by mouth daily. Reported on 06/26/2015     Cholecalciferol (VITAMIN D3) 2000 units capsule Take 4,000 Units by mouth.     Cyanocobalamin (VITAMIN B-12) 5000 MCG SUBL Place 2 each under the tongue daily.     cyclobenzaprine (FLEXERIL) 10 MG tablet Take 1 tablet (10 mg total) by mouth 3 (three) times daily as needed for muscle spasms. 30 tablet 0   Dapsone 5 % topical gel Aczone 5 % topical gel  APPLY A SMALL AMOUNT TO AFFECTED AREA EVERY MORNING     docusate sodium (COLACE) 100 MG capsule Take 100 mg by mouth daily.     doxycycline (VIBRAMYCIN) 50 MG capsule TAKE 1 CAPSULE BY MOUTH EVERY DAY 90 capsule 2   finasteride (PROSCAR) 5 MG tablet TAKE 1 TABLET BY MOUTH EVERY DAY 90 tablet 1   fluticasone (FLONASE) 50 MCG/ACT nasal spray SPRAY 2 SPRAYS INTO EACH NOSTRIL EVERY DAY 48 mL 2    ipratropium (ATROVENT) 0.03 % nasal spray Place 2 sprays into both nostrils every 12 (twelve) hours. 30 mL 12   ketoconazole (NIZORAL) 2 % shampoo Shampoo from the waist up let sit 5 minutes then wash off. Use twice per week for 2 mths. Then QM there after. 120 mL 11   levothyroxine (SYNTHROID) 112 MCG tablet TAKE 1TAB BY MOUTH ONCE DAILY ON EMPTY STOMACH WITH A GLASS OF WATER 30-60MIN BEFORE BREAKFAST 90 tablet 3   lisinopril (ZESTRIL) 5 MG tablet TAKE 1 TABLET BY MOUTH EVERY DAY 90 tablet 1   metroNIDAZOLE (METROGEL) 1 % gel metronidazole 1 % topical gel  APPLY TO AFFECTED AREA EVERY DAY     montelukast (SINGULAIR) 10 MG tablet TAKE 1 TABLET BY MOUTH EVERYDAY AT BEDTIME 90 tablet 1   Multiple Vitamin (MULTIVITAMIN) capsule Take 1 capsule by mouth daily.     oxyCODONE (OXY IR/ROXICODONE) 5 MG immediate release tablet oxycodone 5 mg tablet     pantoprazole (PROTONIX) 40 MG tablet TAKE 1 TABLET BY MOUTH EVERY DAY BEFORE A MEAL AS DIRECTED 90 tablet 3   predniSONE (DELTASONE) 20 MG tablet Take 2 tablets (40 mg total) by mouth daily with breakfast. 10 tablet 0   Pyridoxine HCl 200  MG TBCR Take by mouth.     torsemide (DEMADEX) 5 MG tablet TAKE 1 TABLET BY MOUTH EVERY DAY 90 tablet 1   TRELEGY ELLIPTA 200-62.5-25 MCG/ACT AEPB Inhale 1 puff into the lungs daily.     No current facility-administered medications on file prior to visit.     ROS see history of present illness  Objective  Physical Exam Vitals:   12/09/21 1201  BP: 130/80  Pulse: 99  Temp: 99.1 F (37.3 C)  SpO2: 96%    BP Readings from Last 3 Encounters:  12/09/21 130/80  12/29/20 126/84  06/27/20 120/80   Wt Readings from Last 3 Encounters:  12/09/21 213 lb 3.2 oz (96.7 kg)  06/29/21 218 lb (98.9 kg)  01/12/21 218 lb (98.9 kg)    Physical Exam Constitutional:      General: He is not in acute distress.    Appearance: He is not diaphoretic.  HENT:     Left Ear: Tympanic membrane normal.     Ears:     Comments:  Cerumen noted that partially obscured the TM after irrigation attempt, the inferior left portion of the TM that was observed appeared normal    Mouth/Throat:     Mouth: Mucous membranes are moist.     Pharynx: Oropharynx is clear. No oropharyngeal exudate.  Cardiovascular:     Rate and Rhythm: Normal rate and regular rhythm.     Heart sounds: Normal heart sounds.  Pulmonary:     Effort: Pulmonary effort is normal.     Breath sounds: Normal breath sounds.  Lymphadenopathy:     Cervical: No cervical adenopathy.  Skin:    General: Skin is warm and dry.  Neurological:     Mental Status: He is alert.      Assessment/Plan: Please see individual problem list.  Problem List Items Addressed This Visit     Cerumen impaction    Right ear irrigated by CMA.  Patient had some dizziness.  Cerumen was not coming out.  Patient was advised on Debrox use over-the-counter.      Viral respiratory illness - Primary    Patient likely has a viral respiratory illness with viral pharyngitis.  Rapid strep test was negative.  No indication of a bacterial illness on exam.  Home COVID test was negative.  We will treat supportively.  He can take a short course of ibuprofen 800 mg every 8 hours as needed.  If his symptoms are not improving over the next 5 days he will let us know.  If you develop shortness of breath he will go to the emergency room.  If he has any worsening symptoms or if the ibuprofen is not helpful he will let us know.      Other Visit Diagnoses     Sore throat       Relevant Orders   POCT rapid strep A (Completed)   Need for immunization against influenza       Relevant Orders   Flu Vaccine QUAD 62moIM (Fluarix, Fluzone & Alfiuria Quad PF) (Completed)       Return if symptoms worsen or fail to improve.   ETommi Rumps MD [place]

## 2021-12-09 NOTE — Patient Instructions (Signed)
Nice to see you.  You can try taking ibuprofen 800 mg every 8 hours as needed for the next few days to see if that helps with your sore throat.  If its not beneficial please let me know. If your symptoms are not improving over the next 5 days please let us know. If you develop shortness of breath please go to the emergency room.

## 2021-12-09 NOTE — Assessment & Plan Note (Addendum)
Right ear irrigated by CMA.  Patient had some dizziness.  Cerumen was not coming out.  Patient was advised on Debrox use over-the-counter.

## 2021-12-09 NOTE — Assessment & Plan Note (Signed)
Patient likely has a viral respiratory illness with viral pharyngitis.  Rapid strep test was negative.  No indication of a bacterial illness on exam.  Home COVID test was negative.  We will treat supportively.  He can take a short course of ibuprofen 800 mg every 8 hours as needed.  If his symptoms are not improving over the next 5 days he will let us know.  If you develop shortness of breath he will go to the emergency room.  If he has any worsening symptoms or if the ibuprofen is not helpful he will let us know.

## 2021-12-14 DIAGNOSIS — J4530 Mild persistent asthma, uncomplicated: Secondary | ICD-10-CM | POA: Diagnosis not present

## 2021-12-15 DIAGNOSIS — M9903 Segmental and somatic dysfunction of lumbar region: Secondary | ICD-10-CM | POA: Diagnosis not present

## 2021-12-15 DIAGNOSIS — M4306 Spondylolysis, lumbar region: Secondary | ICD-10-CM | POA: Diagnosis not present

## 2021-12-15 DIAGNOSIS — M5417 Radiculopathy, lumbosacral region: Secondary | ICD-10-CM | POA: Diagnosis not present

## 2021-12-15 DIAGNOSIS — M9905 Segmental and somatic dysfunction of pelvic region: Secondary | ICD-10-CM | POA: Diagnosis not present

## 2021-12-16 DIAGNOSIS — M1611 Unilateral primary osteoarthritis, right hip: Secondary | ICD-10-CM | POA: Diagnosis not present

## 2021-12-27 ENCOUNTER — Encounter: Payer: Self-pay | Admitting: Family Medicine

## 2021-12-28 DIAGNOSIS — J4530 Mild persistent asthma, uncomplicated: Secondary | ICD-10-CM | POA: Diagnosis not present

## 2021-12-28 DIAGNOSIS — J4540 Moderate persistent asthma, uncomplicated: Secondary | ICD-10-CM | POA: Diagnosis not present

## 2022-01-01 ENCOUNTER — Ambulatory Visit: Payer: Medicare HMO | Admitting: Family Medicine

## 2022-01-02 DIAGNOSIS — G4733 Obstructive sleep apnea (adult) (pediatric): Secondary | ICD-10-CM | POA: Diagnosis not present

## 2022-01-04 DIAGNOSIS — Z0100 Encounter for examination of eyes and vision without abnormal findings: Secondary | ICD-10-CM | POA: Diagnosis not present

## 2022-01-04 DIAGNOSIS — H524 Presbyopia: Secondary | ICD-10-CM | POA: Diagnosis not present

## 2022-01-05 DIAGNOSIS — M5417 Radiculopathy, lumbosacral region: Secondary | ICD-10-CM | POA: Diagnosis not present

## 2022-01-05 DIAGNOSIS — M9903 Segmental and somatic dysfunction of lumbar region: Secondary | ICD-10-CM | POA: Diagnosis not present

## 2022-01-05 DIAGNOSIS — M4306 Spondylolysis, lumbar region: Secondary | ICD-10-CM | POA: Diagnosis not present

## 2022-01-05 DIAGNOSIS — G4733 Obstructive sleep apnea (adult) (pediatric): Secondary | ICD-10-CM | POA: Diagnosis not present

## 2022-01-05 DIAGNOSIS — M9905 Segmental and somatic dysfunction of pelvic region: Secondary | ICD-10-CM | POA: Diagnosis not present

## 2022-01-11 DIAGNOSIS — J4530 Mild persistent asthma, uncomplicated: Secondary | ICD-10-CM | POA: Diagnosis not present

## 2022-01-19 ENCOUNTER — Telehealth: Payer: Self-pay | Admitting: Family Medicine

## 2022-01-19 NOTE — Telephone Encounter (Signed)
Spoke with patient he req CB 01/25/22

## 2022-01-22 IMAGING — CT CT ABD-PELV W/O CM
2 of 4 series · 16 of 46 positions shown, 18 images · non-contrast
Comparison: CT abdomen pelvis dated 05/20/2010.

CLINICAL DATA: 74-year-old male with abdominal pain and
constipation.

EXAM:
CT ABDOMEN AND PELVIS WITHOUT CONTRAST
TECHNIQUE: Multidetector CT imaging of the abdomen and pelvis was performed
following the standard protocol without IV contrast.

[Series 2: axial st · axial · 0.87mm/px · z∈[-514,-84]mm · 13 of 94 slices shown, 15 images]
[im 4/94  soft-tissue]
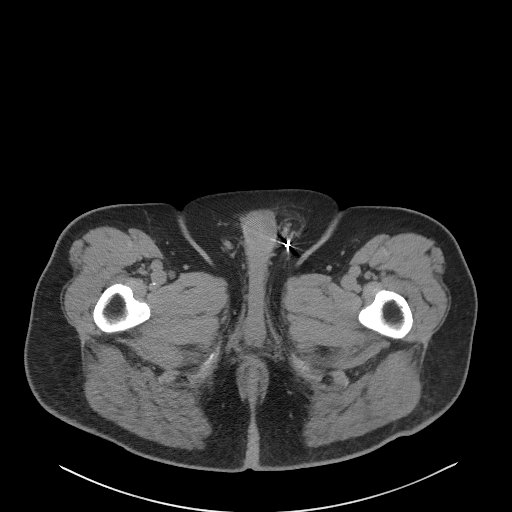
[im 4/94  bone]
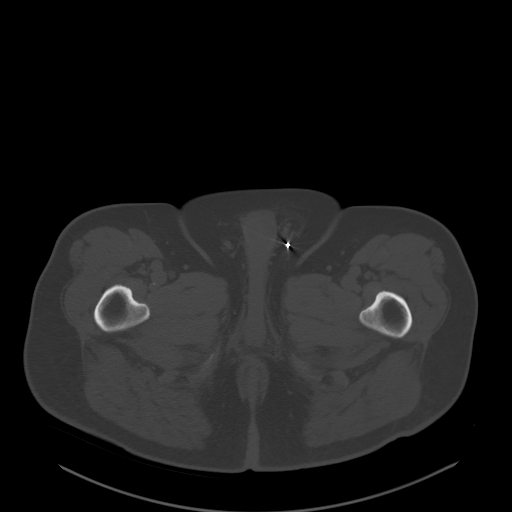
[im 12/94  soft-tissue]
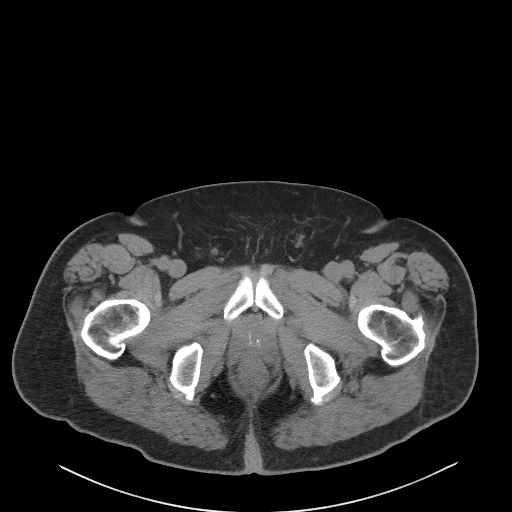
[im 19/94  soft-tissue]
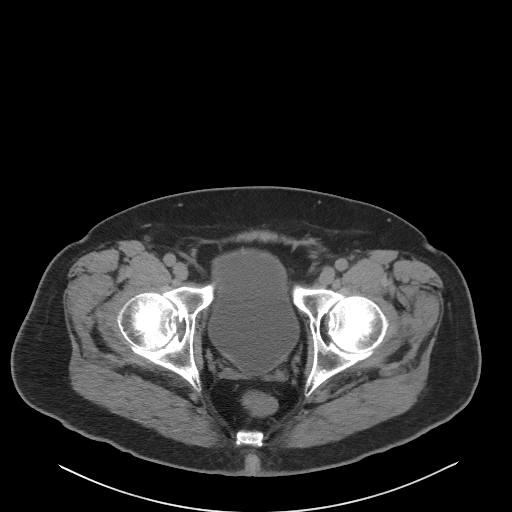
[im 27/94  soft-tissue]
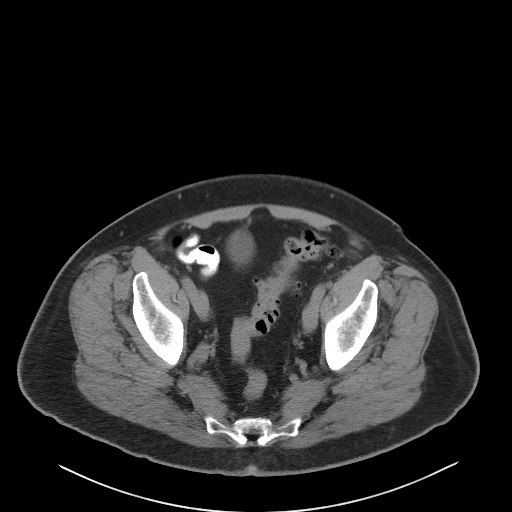
[im 34/94  soft-tissue]
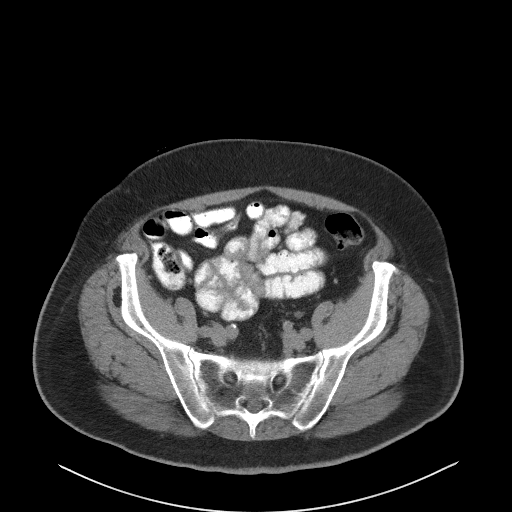
[im 41/94  soft-tissue]
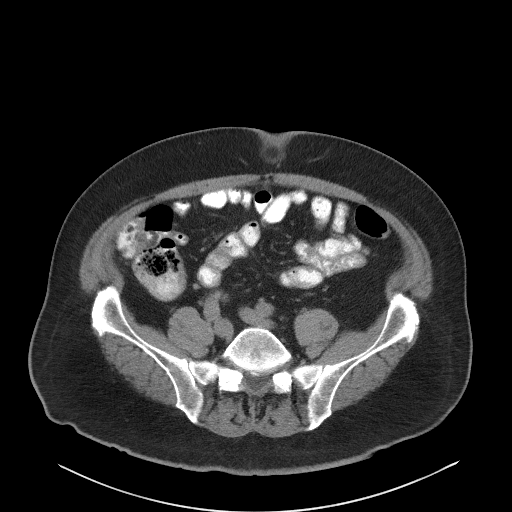
[im 49/94  soft-tissue]
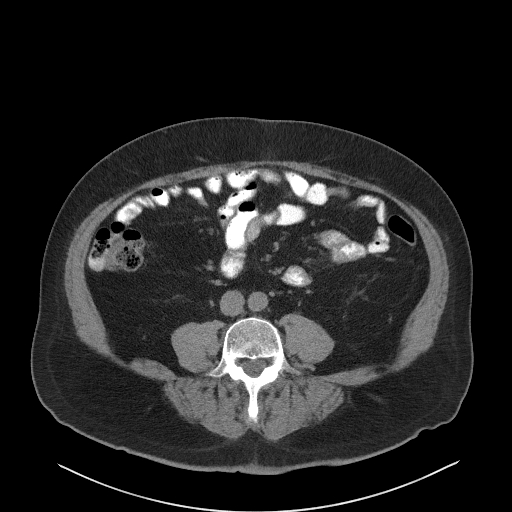
[im 53/94  soft-tissue]
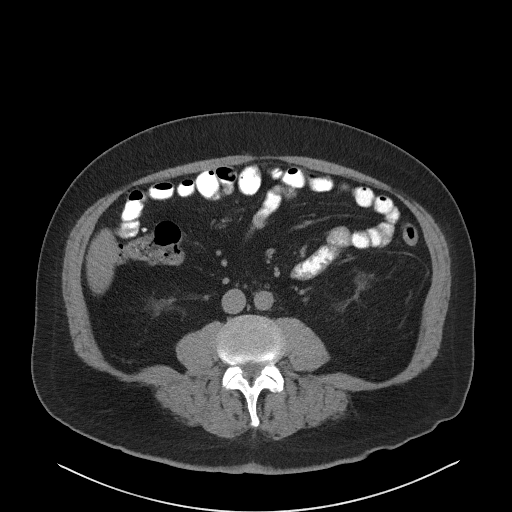
[im 60/94  soft-tissue]
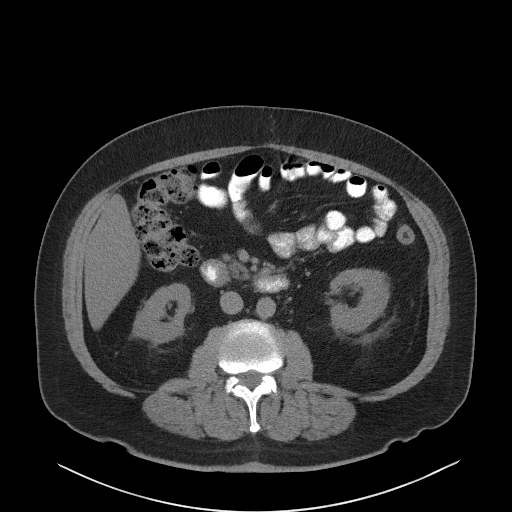
[im 60/94  bone]
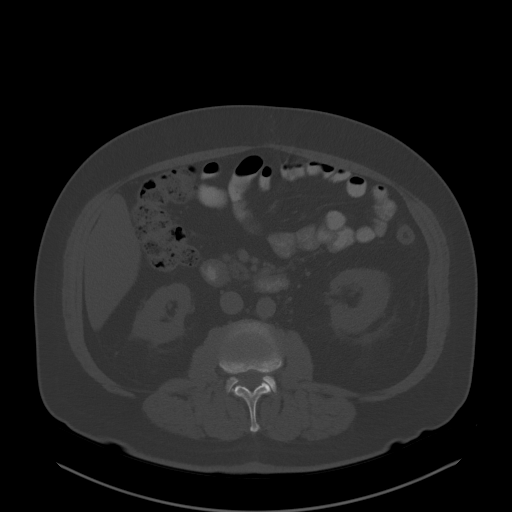
[im 67/94  soft-tissue]
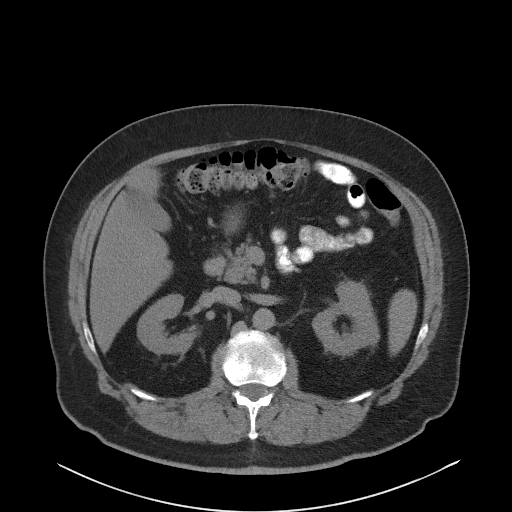
[im 75/94  soft-tissue]
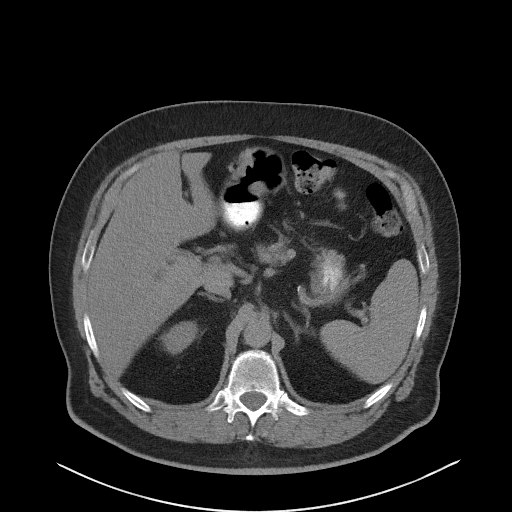
[im 82/94  soft-tissue]
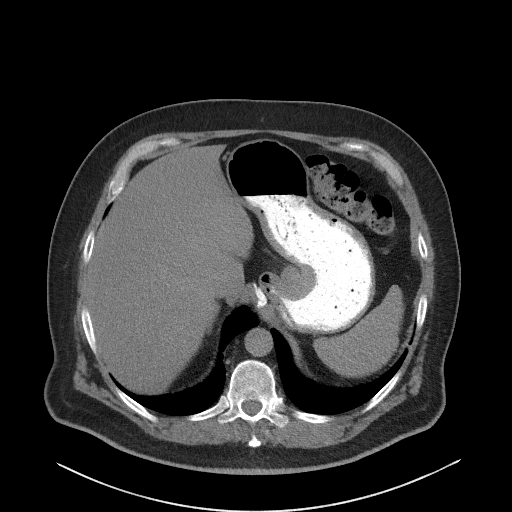
[im 90/94  soft-tissue]
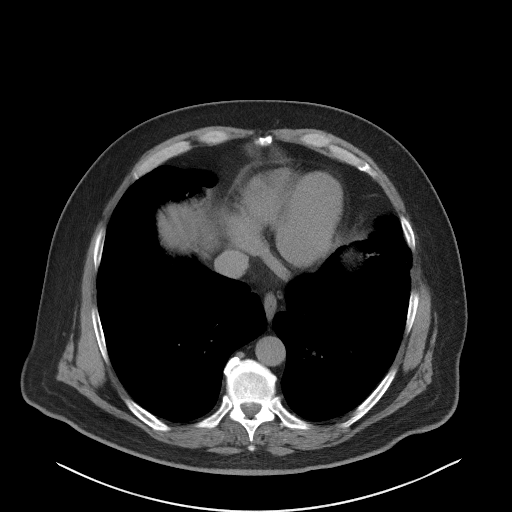

[Series 5: coronal st · coronal · 0.80mm/px · 3 of 111 slices shown]
[im 37/111  soft-tissue]
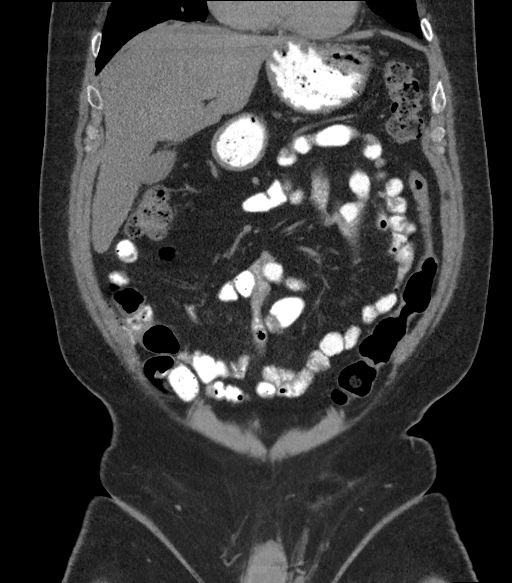
[im 49/111  soft-tissue]
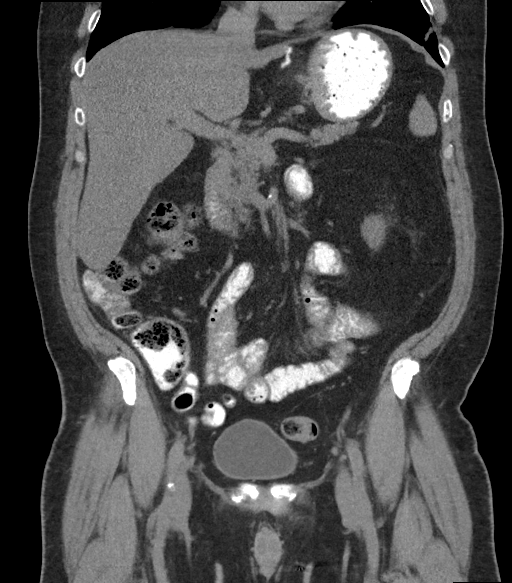
[im 62/111  soft-tissue]
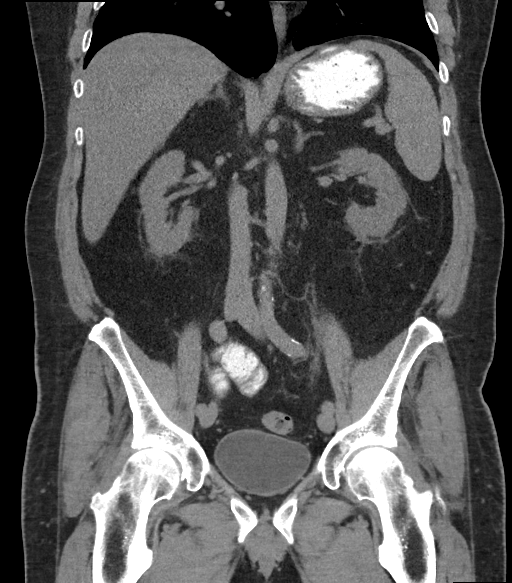

[16 of 46 positions shown; findings below may reference images not displayed]

FINDINGS: Evaluation of this exam is limited in the absence of intravenous
contrast.

Lower chest: The visualized lung bases are clear. Minimal left lung
base linear atelectasis/scarring.

No intra-abdominal free air or free fluid.

Hepatobiliary: Fatty liver. There is slight irregularity of the
liver contour suspicious for early changes of cirrhosis. Clinical
correlation recommended. No intrahepatic biliary dilatation. There
is a 1 cm right hepatic hypodense focus, not significantly changed
compared to the study of 4064, likely a cyst. The gallbladder is
unremarkable.

Pancreas: Unremarkable. No pancreatic ductal dilatation or
surrounding inflammatory changes.

Spleen: Normal in size without focal abnormality.

Adrenals/Urinary Tract: The adrenal glands unremarkable. There is no
hydronephrosis or nephrolithiasis on either side. There is a 16 mm
exophytic indeterminate lesion from the posterior aspect of the
interpolar right kidney which is not characterized on this CT but
may represent a hemorrhagic cyst. Further evaluation with ultrasound
is recommended. The visualized ureters and urinary bladder appear
unremarkable.

Stomach/Bowel: There is sigmoid diverticulosis without active
inflammatory changes. Postsurgical changes of Nissen fundoplication.
There is moderate stool throughout the colon. No bowel obstruction
or active inflammation. Appendectomy.

Vascular/Lymphatic: Mild aortoiliac atherosclerotic disease. The IVC
is unremarkable. No portal gas. There is no adenopathy.

Reproductive: The prostate and seminal vesicles are grossly
unremarkable. No pelvic mass.

Other: Left vasectomy clips. Small fat containing umbilical hernia.
Mild thickening of the periumbilical subcutaneous soft tissues. No
fluid collection.

Musculoskeletal: No acute osseous pathology. Probable small bone
island in the left iliac.
IMPRESSION: 1. No acute intra-abdominal or pelvic pathology.
2. Sigmoid diverticulosis. No bowel obstruction.
3. Fatty liver.
4. Aortic Atherosclerosis ([id number]).

## 2022-01-25 DIAGNOSIS — J4530 Mild persistent asthma, uncomplicated: Secondary | ICD-10-CM | POA: Diagnosis not present

## 2022-01-26 ENCOUNTER — Ambulatory Visit (INDEPENDENT_AMBULATORY_CARE_PROVIDER_SITE_OTHER): Payer: Medicare HMO

## 2022-01-26 VITALS — Ht 69.0 in | Wt 213.0 lb

## 2022-01-26 DIAGNOSIS — K219 Gastro-esophageal reflux disease without esophagitis: Secondary | ICD-10-CM | POA: Diagnosis not present

## 2022-01-26 DIAGNOSIS — Z0000 Encounter for general adult medical examination without abnormal findings: Secondary | ICD-10-CM | POA: Diagnosis not present

## 2022-01-26 DIAGNOSIS — R490 Dysphonia: Secondary | ICD-10-CM | POA: Diagnosis not present

## 2022-01-26 DIAGNOSIS — J301 Allergic rhinitis due to pollen: Secondary | ICD-10-CM | POA: Diagnosis not present

## 2022-01-26 NOTE — Progress Notes (Signed)
Subjective:   SHELDEN RABORN is a 76 y.o. male who presents for Medicare Annual/Subsequent preventive examination.  Review of Systems    No ROS.  Medicare Wellness Virtual Visit.  Visual/audio telehealth visit, UTA vital signs.   See social history for additional risk factors.   Cardiac Risk Factors include: male gender;advanced age (>61mn, >>64women)     Objective:    Today's Vitals   01/26/22 1258  Weight: 213 lb (96.6 kg)  Height: '5\' 9"'$  (1.753 m)   Body mass index is 31.45 kg/m.     01/26/2022   12:44 PM 01/12/2021    9:56 AM 01/10/2020    9:46 AM 01/09/2019    9:39 AM 12/16/2017    9:44 AM 05/05/2015    9:51 AM  Advanced Directives  Does Patient Have a Medical Advance Directive? Yes Yes Yes Yes Yes Yes  Type of AParamedicof ASchlaterLiving will HWalterhillLiving will HSteilacoomLiving will HWhitehawkLiving will HDecaturLiving will HSt. Clair ShoresLiving will  Does patient want to make changes to medical advance directive? No - Patient declined No - Patient declined No - Patient declined No - Patient declined No - Patient declined No - Patient declined  Copy of HAntelopein Chart? No - copy requested No - copy requested No - copy requested No - copy requested No - copy requested No - copy requested    Current Medications (verified) Outpatient Encounter Medications as of 01/26/2022  Medication Sig   [DISCONTINUED] Dupilumab 300 MG/2ML SOPN Inject into the skin.   ADVAIR DISKUS 250-50 MCG/ACT AEPB INHALE 1 PUFF AS NEEDED AS DIRECTED   albuterol (VENTOLIN HFA) 108 (90 Base) MCG/ACT inhaler INHALE 2 PUFFS 4 TIMES A DAY AS NEEDED   Ascorbic Acid (VITAMIN C) 1000 MG tablet Take 1,000 mg by mouth daily. Reported on 06/26/2015   Cholecalciferol (VITAMIN D3) 2000 units capsule Take 4,000 Units by mouth.   Cyanocobalamin (VITAMIN B-12) 5000 MCG  SUBL Place 2 each under the tongue daily.   cyclobenzaprine (FLEXERIL) 10 MG tablet Take 1 tablet (10 mg total) by mouth 3 (three) times daily as needed for muscle spasms.   Dapsone 5 % topical gel Aczone 5 % topical gel  APPLY A SMALL AMOUNT TO AFFECTED AREA EVERY MORNING   docusate sodium (COLACE) 100 MG capsule Take 100 mg by mouth daily.   doxycycline (VIBRAMYCIN) 50 MG capsule TAKE 1 CAPSULE BY MOUTH EVERY DAY   DUPIXENT 300 MG/2ML SOPN Inject into the skin.   finasteride (PROSCAR) 5 MG tablet TAKE 1 TABLET BY MOUTH EVERY DAY   fluticasone (FLONASE) 50 MCG/ACT nasal spray SPRAY 2 SPRAYS INTO EACH NOSTRIL EVERY DAY   ipratropium (ATROVENT) 0.03 % nasal spray Place 2 sprays into both nostrils every 12 (twelve) hours.   ketoconazole (NIZORAL) 2 % shampoo Shampoo from the waist up let sit 5 minutes then wash off. Use twice per week for 2 mths. Then QM there after.   levothyroxine (SYNTHROID) 112 MCG tablet TAKE 1TAB BY MOUTH ONCE DAILY ON EMPTY STOMACH WITH A GLASS OF WATER 30-60MIN BEFORE BREAKFAST   lisinopril (ZESTRIL) 5 MG tablet TAKE 1 TABLET BY MOUTH EVERY DAY   metroNIDAZOLE (METROGEL) 1 % gel metronidazole 1 % topical gel  APPLY TO AFFECTED AREA EVERY DAY   montelukast (SINGULAIR) 10 MG tablet TAKE 1 TABLET BY MOUTH EVERYDAY AT BEDTIME   Multiple Vitamin (MULTIVITAMIN) capsule  Take 1 capsule by mouth daily.   pantoprazole (PROTONIX) 40 MG tablet TAKE 1 TABLET BY MOUTH EVERY DAY BEFORE A MEAL AS DIRECTED   Pyridoxine HCl 200 MG TBCR Take by mouth.   torsemide (DEMADEX) 5 MG tablet TAKE 1 TABLET BY MOUTH EVERY DAY   TRELEGY ELLIPTA 200-62.5-25 MCG/ACT AEPB Inhale 1 puff into the lungs daily.   [DISCONTINUED] oxyCODONE (OXY IR/ROXICODONE) 5 MG immediate release tablet oxycodone 5 mg tablet   [DISCONTINUED] predniSONE (DELTASONE) 20 MG tablet Take 2 tablets (40 mg total) by mouth daily with breakfast.   No facility-administered encounter medications on file as of 01/26/2022.     Allergies (verified) Sulfa drugs cross reactors   History: Past Medical History:  Diagnosis Date   Asthma    adult onset, PFTs done in [facility])    Several moles removed in past, all pre-cancerous.  Recently followed by Dr. Nehemiah Massed   Depression    Treated with Prozac in 1992,  Associate with job loss   Esophageal reflux    Gout    Hiatal hernia    Hypertension    Squamous cell carcinoma of skin 07/30/2015   R anterior lat neck, Atypical squamous proliferation, excised 08/28/2015   Past Surgical History:  Procedure Laterality Date   APPENDECTOMY     COLONOSCOPY N/A 12/27/2014   Procedure: COLONOSCOPY;  Surgeon: Lollie Sails, MD;  Location: [facility] ENDOSCOPY;  Service: Endoscopy;  Laterality: N/A;   LAPAROSCOPIC PARAESOPHAGEAL HERNIA REPAIR     TONSILLECTOMY     Family History  Problem Relation Age of Onset   Hypertension Mother    Hyperlipidemia Mother    Diabetes Mother    Heart attack Father 67       MI   Hypertension Father    Cancer Sister        pancreatic   Social History   Socioeconomic History   Marital status: Married    Spouse name: Not on file   Number of children: Not on file   Years of education: Not on file   Highest education level: Not on file  Occupational History   Not on file  Tobacco Use   Smoking status: Former    Packs/day: 0.50    Years: 20.00    Total pack years: 10.00    Types: Cigarettes    Quit date: 06/15/1999    Years since quitting: 22.6   Smokeless tobacco: Never  Substance and Sexual Activity   Alcohol use: Yes    Alcohol/week: 1.0 standard drink of alcohol    Types: 1 Standard drinks or equivalent per week    Comment: rarely   Drug use: No   Sexual activity: Yes  Other Topics Concern   Not on file  Social History Narrative   Not on file   Social Determinants of Health   Financial Resource Strain: Low Risk  (01/26/2022)   Overall Financial Resource Strain (CARDIA)    Difficulty of Paying Living  Expenses: Not hard at all  Food Insecurity: No Food Insecurity (01/26/2022)   Hunger Vital Sign    Worried About Running Out of Food in the Last Year: Never true    Ran Out of Food in the Last Year: Never true  Transportation Needs: No Transportation Needs (01/26/2022)   PRAPARE - Hydrologist (Medical): No    Lack of Transportation (Non-Medical): No  Physical Activity: Sufficiently Active (01/26/2022)   Exercise Vital Sign  Days of Exercise per Week: 7 days    Minutes of Exercise per Session: 30 min  Stress: No Stress Concern Present (01/26/2022)   [place]    Feeling of Stress : Not at all  Social Connections: Unknown (01/26/2022)   Social Connection and Isolation Panel [NHANES]    Frequency of Communication with Friends and Family: More than three times a week    Frequency of Social Gatherings with Friends and Family: More than three times a week    Attends Religious Services: Not on Advertising copywriter or Organizations: Not on file    Attends Archivist Meetings: Not on file    Marital Status: Married    Tobacco Counseling Counseling given: Not Answered   Clinical Intake:  Pre-visit preparation completed: Yes        Diabetes: No  How often do you need to have someone help you when you read instructions, pamphlets, or other written materials from your doctor or pharmacy?: 1 - Never    Interpreter Needed?: No      Activities of Daily Living    01/26/2022   12:38 PM  In your present state of health, do you have any difficulty performing the following activities:  Hearing? 1  Comment Hearing aid  Vision? 0  Difficulty concentrating or making decisions? 0  Walking or climbing stairs? 0  Comment Followed by [facility], Dr. Morton Stall. R hip injection 4-5 months ago. Follow up as needed.  Dressing or bathing? 0  Doing errands, shopping? 0  Preparing  Food and eating ? N  Using the Toilet? N  In the past six months, have you accidently leaked urine? N  Do you have problems with loss of bowel control? N  Managing your Medications? N  Managing your Finances? N  Housekeeping or managing your Housekeeping? N    Patient Care Team: Leone Haven, MD as PCP - General (Family Medicine) Rockey Situ Kathlene November, MD as Consulting Physician (Cardiology)  Indicate any recent Medical Services you may have received from other than [facility] providers in the past year (date may be approximate).     Assessment:   This is a routine wellness examination for [place].  I connected with  Verta Ellen on 01/26/22 by a audio enabled telemedicine application and verified that I am speaking with the correct person using two identifiers.  Patient Location: Home  Provider Location: Office/Clinic  I discussed the limitations of evaluation and management by telemedicine. The patient expressed understanding and agreed to proceed.   Hearing/Vision screen Hearing Screening - Comments:: Hearing aids Vision Screening - Comments:: Followed by [facility], Dr. Kerin Ransom Wears corrective lenses Annual visits They have regular follow up with the ophthalmologist  Dietary issues and exercise activities discussed: Current Exercise Habits: Home exercise routine, Type of exercise: walking, Intensity: Mild   Goals Addressed               This Visit's Progress     Patient Stated     Weight 185lb-195lb (pt-stated)   On track     I want to lose weight.  Stay active, walk more for exercise Healthy diet        Depression Screen    01/26/2022   12:38 PM 06/29/2021   10:37 AM 01/12/2021    9:57 AM 12/29/2020   10:06 AM 06/27/2020   10:35 AM 01/10/2020    9:40 AM 12/19/2019   11:02  AM  PHQ 2/9 Scores  PHQ - 2 Score 0 0 0 0 0 0 0    Fall Risk    01/26/2022   12:38 PM 06/29/2021   10:37 AM 01/12/2021    9:57 AM 12/29/2020   10:06 AM 06/27/2020   10:35 AM   Fall Risk   Falls in the past year? 0 0 0 0 0  Number falls in past yr: 0 0  0 0  Injury with Fall? 0 0  0   Risk for fall due to : No Fall Risks No Fall Risks     Follow up Falls evaluation completed Falls evaluation completed Falls evaluation completed Falls evaluation completed Falls evaluation completed    [place]: Home free of loose throw rugs in walkways, pet beds, electrical cords, etc? Yes  Adequate lighting in your home to reduce risk of falls? Yes   ASSISTIVE DEVICES UTILIZED TO PREVENT FALLS: Life alert? No  Use of a cane, walker or w/c? No   TIMED UP AND GO: Was the test performed? No .   Cognitive Function: Patient is alert and oriented x3.  100% independent.  Manages his own medications and finances.  Denies difficulty focusing, making decisions, memory loss.      12/16/2017    9:37 AM 05/05/2015   10:03 AM  MMSE - Mini Mental State Exam  Orientation to time 5 5  Orientation to Place 5 5  Registration 3 3  Attention/ Calculation 5 5  Recall 3 3  Language- name 2 objects 2 2  Language- repeat 1 1  Language- follow 3 step command 3 3  Language- read & follow direction 1 1  Write a sentence 1 1  Copy design 1 1  Total score 30 30        01/26/2022   12:39 PM 01/09/2019    9:53 AM  6CIT Screen  What Year?  0 points  What month?  0 points  What time?  0 points  Count back from 20  0 points  Months in reverse 0 points 0 points  Repeat phrase  0 points  Total Score  0 points    Immunizations Immunization History  Administered Date(s) Administered   Fluad Quad(high Dose 65+) 01/01/2019, 12/19/2019, 12/29/2020   Influenza Split 03/03/2013   Influenza, High Dose Seasonal PF 02/08/2017, 02/13/2018, 01/01/2019   Influenza,inj,Quad PF,6+ Mos 01/16/2014, 12/09/2021   Influenza-Unspecified 01/25/2012, 02/18/2015, 02/01/2016, 02/08/2017   Moderna Sars-Covid-2 Vaccination 03/24/2020, 09/14/2020   Pneumococcal  Conjugate-13 06/04/2013   Pneumococcal Polysaccharide-23 10/25/2008, 05/05/2015   Tdap 04/26/2009   Zoster Recombinat (Shingrix) 09/19/2017, 10/12/2017   Zoster, Live 03/03/2013    TDAP status: Due, Education has been provided regarding the importance of this vaccine. Advised may receive this vaccine at local pharmacy or Health Dept. Aware to provide a copy of the vaccination record if obtained from local pharmacy or Health Dept. Verbalized acceptance and understanding.  Covid-19 vaccine status: Completed vaccines. Agrees to update immunization record.   Screening Tests Health Maintenance  Topic Date Due   Zoster Vaccines- Shingrix (2 of 2) 12/07/2017   TETANUS/TDAP  04/27/2019   COVID-19 Vaccine (3 - Moderna risk series) 10/12/2020   COLONOSCOPY (Pts 45-53yr Insurance coverage will need to be confirmed)  12/26/2024   Pneumonia Vaccine 75 Years old  Completed   INFLUENZA VACCINE  Completed   Hepatitis C Screening  Completed   HPV [place]  Maintenance Due  Topic Date Due   Zoster Vaccines- Shingrix (2 of 2) 12/07/2017   TETANUS/TDAP  04/27/2019   COVID-19 Vaccine (3 - Moderna risk series) 10/12/2020   Hepatitis C Screening: Completed 2017.  Vision Screening: Recommended annual ophthalmology exams for early detection of glaucoma and other disorders of the eye.  Dental Screening: Recommended annual dental exams for proper oral hygiene  Community Resource Referral / Chronic Care Management: CRR required this visit?  No   CCM required this visit?  No      Plan:     I have personally reviewed and noted the following in the patient's chart:   Medical and social history Use of alcohol, tobacco or illicit drugs  Current medications and supplements including opioid prescriptions. Patient is not currently taking opioid prescriptions. Functional ability and status Nutritional status Physical activity Advanced directives List of other  physicians Hospitalizations, surgeries, and ER visits in previous 12 months Vitals Screenings to include cognitive, depression, and falls Referrals and appointments  In addition, I have reviewed and discussed with patient certain preventive protocols, quality metrics, and best practice recommendations. A written personalized care plan for preventive services as well as general preventive health recommendations were provided to patient.     Varney Biles, LPN   03/47/[id number]

## 2022-01-26 NOTE — Patient Instructions (Addendum)
Alexander Fitzgerald , Thank you for taking time to come for your Medicare Wellness Visit. I appreciate your ongoing commitment to your health goals. Please review the following plan we discussed and let me know if I can assist you in the future.   These are the goals we discussed:  Goals       Patient Stated     Weight 185lb-195lb (pt-stated)      I want to lose weight.  Stay active, walk more for exercise Healthy diet         This is a list of the screening recommended for you and due dates:  Health Maintenance  Topic Date Due   Zoster (Shingles) Vaccine (2 of 2) 12/07/2017   Tetanus Vaccine  04/27/2019   COVID-19 Vaccine (3 - Moderna risk series) 10/12/2020   Colon Cancer Screening  12/26/2024   Pneumonia Vaccine  Completed   Flu Shot  Completed   Hepatitis C Screening: USPSTF Recommendation to screen - Ages 18-79 yo.  Completed   HPV Vaccine  Aged Out    Advanced directives: End of life planning; Advance aging; Advanced directives discussed.  Copy of current HCPOA/Living Will requested.    Conditions/risks identified: none new.  Next appointment: Follow up in one year for your annual wellness visit.   Preventive Care 75 Years and Older, Male  Preventive care refers to lifestyle choices and visits with your health care provider that can promote health and wellness. What does preventive care include? A yearly physical exam. This is also called an annual well check. Dental exams once or twice a year. Routine eye exams. Ask your health care provider how often you should have your eyes checked. Personal lifestyle choices, including: Daily care of your teeth and gums. Regular physical activity. Eating a healthy diet. Avoiding tobacco and drug use. Limiting alcohol use. Practicing safe sex. Taking low doses of aspirin every day. Taking vitamin and mineral supplements as recommended by your health care provider. What happens during an annual well check? The services and  screenings done by your health care provider during your annual well check will depend on your age, overall health, lifestyle risk factors, and family history of disease. Counseling  Your health care provider may ask you questions about your: Alcohol use. Tobacco use. Drug use. Emotional well-being. Home and relationship well-being. Sexual activity. Eating habits. History of falls. Memory and ability to understand (cognition). Work and work Statistician. Screening  You may have the following tests or measurements: Height, weight, and BMI. Blood pressure. Lipid and cholesterol levels. These may be checked every 5 years, or more frequently if you are over 84 years old. Skin check. Lung cancer screening. You may have this screening every year starting at age 89 if you have a 30-pack-year history of smoking and currently smoke or have quit within the past 15 years. Fecal occult blood test (FOBT) of the stool. You may have this test every year starting at age 46. Flexible sigmoidoscopy or colonoscopy. You may have a sigmoidoscopy every 5 years or a colonoscopy every 10 years starting at age 64. Prostate cancer screening. Recommendations will vary depending on your family history and other risks. Hepatitis C blood test. Hepatitis B blood test. Sexually transmitted disease (STD) testing. Diabetes screening. This is done by checking your blood sugar (glucose) after you have not eaten for a while (fasting). You may have this done every 1-3 years. Abdominal aortic aneurysm (AAA) screening. You may need this if you are a current  or former smoker. Osteoporosis. You may be screened starting at age 21 if you are at high risk. Talk with your health care provider about your test results, treatment options, and if necessary, the need for more tests. Vaccines  Your health care provider may recommend certain vaccines, such as: Influenza vaccine. This is recommended every year. Tetanus, diphtheria, and  acellular pertussis (Tdap, Td) vaccine. You may need a Td booster every 10 years. Zoster vaccine. You may need this after age 19. Pneumococcal 13-valent conjugate (PCV13) vaccine. One dose is recommended after age 28. Pneumococcal polysaccharide (PPSV23) vaccine. One dose is recommended after age 62. Talk to your health care provider about which screenings and vaccines you need and how often you need them. This information is not intended to replace advice given to you by your health care provider. Make sure you discuss any questions you have with your health care provider. Document Released: 05/02/2015 Document Revised: 12/24/2015 Document Reviewed: 02/04/2015 Elsevier Interactive Patient Education  2017 [place] Prevention in the Home Falls can cause injuries. They can happen to people of all ages. There are many things you can do to make your home safe and to help prevent falls. What can I do on the outside of my home? Regularly fix the edges of walkways and driveways and fix any cracks. Remove anything that might make you trip as you walk through a door, such as a raised step or threshold. Trim any bushes or trees on the path to your home. Use bright outdoor lighting. Clear any walking paths of anything that might make someone trip, such as rocks or tools. Regularly check to see if handrails are loose or broken. Make sure that both sides of any steps have handrails. Any raised decks and porches should have guardrails on the edges. Have any leaves, snow, or ice cleared regularly. Use sand or salt on walking paths during winter. Clean up any spills in your garage right away. This includes oil or grease spills. What can I do in the bathroom? Use night lights. Install grab bars by the toilet and in the tub and shower. Do not use towel bars as grab bars. Use non-skid mats or decals in the tub or shower. If you need to sit down in the shower, use a plastic, non-slip stool. Keep  the floor dry. Clean up any water that spills on the floor as soon as it happens. Remove soap buildup in the tub or shower regularly. Attach bath mats securely with double-sided non-slip rug tape. Do not have throw rugs and other things on the floor that can make you trip. What can I do in the bedroom? Use night lights. Make sure that you have a light by your bed that is easy to reach. Do not use any sheets or blankets that are too big for your bed. They should not hang down onto the floor. Have a firm chair that has side arms. You can use this for support while you get dressed. Do not have throw rugs and other things on the floor that can make you trip. What can I do in the kitchen? Clean up any spills right away. Avoid walking on wet floors. Keep items that you use a lot in easy-to-reach places. If you need to reach something above you, use a strong step stool that has a grab bar. Keep electrical cords out of the way. Do not use floor polish or wax that makes floors slippery. If you must use wax,  use non-skid floor wax. Do not have throw rugs and other things on the floor that can make you trip. What can I do with my stairs? Do not leave any items on the stairs. Make sure that there are handrails on both sides of the stairs and use them. Fix handrails that are broken or loose. Make sure that handrails are as long as the stairways. Check any carpeting to make sure that it is firmly attached to the stairs. Fix any carpet that is loose or worn. Avoid having throw rugs at the top or bottom of the stairs. If you do have throw rugs, attach them to the floor with carpet tape. Make sure that you have a light switch at the top of the stairs and the bottom of the stairs. If you do not have them, ask someone to add them for you. What else can I do to help prevent falls? Wear shoes that: Do not have high heels. Have rubber bottoms. Are comfortable and fit you well. Are closed at the toe. Do not  wear sandals. If you use a stepladder: Make sure that it is fully opened. Do not climb a closed stepladder. Make sure that both sides of the stepladder are locked into place. Ask someone to hold it for you, if possible. Clearly mark and make sure that you can see: Any grab bars or handrails. First and last steps. Where the edge of each step is. Use tools that help you move around (mobility aids) if they are needed. These include: Canes. Walkers. Scooters. Crutches. Turn on the lights when you go into a dark area. Replace any light bulbs as soon as they burn out. Set up your furniture so you have a clear path. Avoid moving your furniture around. If any of your floors are uneven, fix them. If there are any pets around you, be aware of where they are. Review your medicines with your doctor. Some medicines can make you feel dizzy. This can increase your chance of falling. Ask your doctor what other things that you can do to help prevent falls. This information is not intended to replace advice given to you by your health care provider. Make sure you discuss any questions you have with your health care provider. Document Released: 01/30/2009 Document Revised: 09/11/2015 Document Reviewed: 05/10/2014 Elsevier Interactive Patient Education  2017 [employer].

## 2022-01-29 DIAGNOSIS — M5417 Radiculopathy, lumbosacral region: Secondary | ICD-10-CM | POA: Diagnosis not present

## 2022-01-29 DIAGNOSIS — M9903 Segmental and somatic dysfunction of lumbar region: Secondary | ICD-10-CM | POA: Diagnosis not present

## 2022-01-29 DIAGNOSIS — M9905 Segmental and somatic dysfunction of pelvic region: Secondary | ICD-10-CM | POA: Diagnosis not present

## 2022-01-29 DIAGNOSIS — M4306 Spondylolysis, lumbar region: Secondary | ICD-10-CM | POA: Diagnosis not present

## 2022-02-01 DIAGNOSIS — G4733 Obstructive sleep apnea (adult) (pediatric): Secondary | ICD-10-CM | POA: Diagnosis not present

## 2022-02-11 ENCOUNTER — Ambulatory Visit: Payer: Medicare HMO | Admitting: Podiatry

## 2022-02-11 ENCOUNTER — Encounter: Payer: Self-pay | Admitting: Podiatry

## 2022-02-11 DIAGNOSIS — M79674 Pain in right toe(s): Secondary | ICD-10-CM

## 2022-02-11 DIAGNOSIS — B351 Tinea unguium: Secondary | ICD-10-CM

## 2022-02-11 DIAGNOSIS — M79675 Pain in left toe(s): Secondary | ICD-10-CM | POA: Diagnosis not present

## 2022-02-11 NOTE — Progress Notes (Signed)
This patient presents to the office with chief complaint of long thick painful nails.  Patient says the nails are painful walking and wearing shoes.  This patient is unable to self treat.  This patient is unable to trim his nails since he is unable to reach his nails.  he presents to the office for preventative foot care services.  General Appearance  Alert, conversant and in no acute stress.  Vascular  Dorsalis pedis and posterior tibial  pulses are palpable  bilaterally.  Capillary return is within normal limits  bilaterally. Temperature is within normal limits  bilaterally.  Neurologic  Senn-Weinstein monofilament wire test within normal limits  bilaterally. Muscle power within normal limits bilaterally.  Nails Thick disfigured discolored nails with subungual debris  from hallux to fifth toes bilaterally. No evidence of bacterial infection or drainage bilaterally.  Orthopedic  No limitations of motion  feet .  No crepitus or effusions noted.  No bony pathology or digital deformities noted.  Skin  normotropic skin with no porokeratosis noted bilaterally.  No signs of infections or ulcers noted.     Onychomycosis  Nails  B/L.  Pain in right toes  Pain in left toes  Debridement of nails both feet followed trimming the nails with dremel tool.    RTC  10 weeks    Gardiner Barefoot DPM

## 2022-02-17 ENCOUNTER — Encounter: Payer: Self-pay | Admitting: Family Medicine

## 2022-02-17 DIAGNOSIS — J4530 Mild persistent asthma, uncomplicated: Secondary | ICD-10-CM | POA: Diagnosis not present

## 2022-02-17 DIAGNOSIS — J8283 Eosinophilic asthma: Secondary | ICD-10-CM | POA: Diagnosis not present

## 2022-02-19 ENCOUNTER — Encounter: Payer: Self-pay | Admitting: Physician Assistant

## 2022-02-22 NOTE — Telephone Encounter (Signed)
I responded previously to his other message. Please call him to get him set up for an in person visit as outlined in his other mychart message. Thanks.

## 2022-02-23 DIAGNOSIS — M9905 Segmental and somatic dysfunction of pelvic region: Secondary | ICD-10-CM | POA: Diagnosis not present

## 2022-02-23 DIAGNOSIS — M4306 Spondylolysis, lumbar region: Secondary | ICD-10-CM | POA: Diagnosis not present

## 2022-02-23 DIAGNOSIS — M9903 Segmental and somatic dysfunction of lumbar region: Secondary | ICD-10-CM | POA: Diagnosis not present

## 2022-02-23 DIAGNOSIS — M5417 Radiculopathy, lumbosacral region: Secondary | ICD-10-CM | POA: Diagnosis not present

## 2022-02-23 NOTE — Telephone Encounter (Signed)
Pt scheduled 02/26/22.

## 2022-02-25 DIAGNOSIS — H903 Sensorineural hearing loss, bilateral: Secondary | ICD-10-CM | POA: Diagnosis not present

## 2022-02-26 ENCOUNTER — Ambulatory Visit (INDEPENDENT_AMBULATORY_CARE_PROVIDER_SITE_OTHER): Payer: Medicare HMO | Admitting: Family Medicine

## 2022-02-26 ENCOUNTER — Encounter: Payer: Self-pay | Admitting: Family Medicine

## 2022-02-26 VITALS — BP 126/84 | HR 79 | Temp 98.0°F | Ht 69.0 in | Wt 222.0 lb

## 2022-02-26 DIAGNOSIS — R0609 Other forms of dyspnea: Secondary | ICD-10-CM | POA: Diagnosis not present

## 2022-02-26 LAB — COMPREHENSIVE METABOLIC PANEL
ALT: 34 U/L (ref 0–53)
AST: 24 U/L (ref 0–37)
Albumin: 4.2 g/dL (ref 3.5–5.2)
Alkaline Phosphatase: 71 U/L (ref 39–117)
BUN: 16 mg/dL (ref 6–23)
CO2: 29 mEq/L (ref 19–32)
Calcium: 9.2 mg/dL (ref 8.4–10.5)
Chloride: 104 mEq/L (ref 96–112)
Creatinine, Ser: 1.06 mg/dL (ref 0.40–1.50)
GFR: 68.63 mL/min (ref >60.00)
Glucose, Bld: 116 mg/dL — ABNORMAL HIGH (ref 70–99)
Potassium: 4.5 mEq/L (ref 3.5–5.1)
Sodium: 139 mEq/L (ref 135–145)
Total Bilirubin: 0.6 mg/dL (ref 0.2–1.2)
Total Protein: 6.2 g/dL (ref 6.0–8.3)

## 2022-02-26 LAB — CBC
HCT: 46.1 % (ref 39.0–52.0)
Hemoglobin: 15.5 g/dL (ref 13.0–17.0)
MCHC: 33.6 g/dL (ref 30.0–36.0)
MCV: 89.2 fl (ref 78.0–100.0)
Platelets: 221 10*3/uL (ref 150.0–400.0)
RBC: 5.17 Mil/uL (ref 4.22–5.81)
RDW: 13.4 % (ref 11.5–15.5)
WBC: 6.6 10*3/uL (ref 4.0–10.5)

## 2022-02-26 LAB — TSH: TSH: 0.05 u[IU]/mL — ABNORMAL LOW (ref 0.35–5.50)

## 2022-02-26 LAB — BRAIN NATRIURETIC PEPTIDE: Pro B Natriuretic peptide (BNP): 40 pg/mL (ref 0.0–100.0)

## 2022-02-26 NOTE — Assessment & Plan Note (Addendum)
Patient symptoms could be related to multiple causes.  His swelling could be related to venous insufficiency, varicose veins, CHF, DVT, or be related to several other issues.  Shortness of breath could be cardiac or pulmonary.  CHF would seem to be the leading candidate for his symptoms given improvement in his swelling with torsemide.  Discussed work-up including EKG, lab work, and possible imaging depending on what the lab work reveals.  His EKG is stable and reassuring.  We will get lab work today and determine the next step in management.  Consider cardiology referral once labs return.  Advised if he develop persistent chest pain or shortness of breath he needs to go to the emergency department.

## 2022-02-26 NOTE — Patient Instructions (Signed)
Nice to see you. If you have any persistent chest pain or shortness of breath please seek medical attention immediately. We will call with your lab results.

## 2022-02-26 NOTE — Progress Notes (Signed)
Alexander Rumps, MD Phone: [phone]  Alexander Fitzgerald is a 76 y.o. male who presents today for same-day visit.  Left leg swelling: Patient notes ongoing issues with this though its been worse recently.  He started on Dupixent about 7 weeks ago per his report and notes the swelling got worse around that time.  He saw his pulmonologist and they advised him to do torsemide for 3 days and that did help with his swelling though when he came off the torsemide the swelling came back.  The swelling does not resolve completely with elevation.  He has some intermittent shortness of breath with exertion that has been somewhat progressive.  He has had some intermittent chest pain that feels as though it is possibly superficial in his left upper chest though it does occur with exertion.  He notes no orthopnea or PND.  Social History   Tobacco Use  Smoking Status Former   Packs/day: 0.50   Years: 20.00   Total pack years: 10.00   Types: Cigarettes   Quit date: 06/15/1999   Years since quitting: 22.7  Smokeless Tobacco Never    Current Outpatient Medications on File Prior to Visit  Medication Sig Dispense Refill   ADVAIR DISKUS 250-50 MCG/ACT AEPB INHALE 1 PUFF AS NEEDED AS DIRECTED 60 each 0   albuterol (VENTOLIN HFA) 108 (90 Base) MCG/ACT inhaler INHALE 2 PUFFS 4 TIMES A DAY AS NEEDED 8.5 each 2   Ascorbic Acid (VITAMIN C) 1000 MG tablet Take 1,000 mg by mouth daily. Reported on 06/26/2015     Cholecalciferol (VITAMIN D3) 2000 units capsule Take 4,000 Units by mouth.     Cyanocobalamin (VITAMIN B-12) 5000 MCG SUBL Place 2 each under the tongue daily.     cyclobenzaprine (FLEXERIL) 10 MG tablet Take 1 tablet (10 mg total) by mouth 3 (three) times daily as needed for muscle spasms. 30 tablet 0   Dapsone 5 % topical gel Aczone 5 % topical gel  APPLY A SMALL AMOUNT TO AFFECTED AREA EVERY MORNING     docusate sodium (COLACE) 100 MG capsule Take 100 mg by mouth daily.     doxycycline (VIBRAMYCIN) 50  MG capsule TAKE 1 CAPSULE BY MOUTH EVERY DAY 90 capsule 2   DUPIXENT 300 MG/2ML SOPN Inject into the skin.     finasteride (PROSCAR) 5 MG tablet TAKE 1 TABLET BY MOUTH EVERY DAY 90 tablet 1   fluticasone (FLONASE) 50 MCG/ACT nasal spray SPRAY 2 SPRAYS INTO EACH NOSTRIL EVERY DAY 48 mL 2   ipratropium (ATROVENT) 0.03 % nasal spray Place 2 sprays into both nostrils every 12 (twelve) hours. 30 mL 12   ketoconazole (NIZORAL) 2 % shampoo Shampoo from the waist up let sit 5 minutes then wash off. Use twice per week for 2 mths. Then QM there after. 120 mL 11   levothyroxine (SYNTHROID) 112 MCG tablet TAKE 1TAB BY MOUTH ONCE DAILY ON EMPTY STOMACH WITH A GLASS OF WATER 30-60MIN BEFORE BREAKFAST 90 tablet 3   lisinopril (ZESTRIL) 5 MG tablet TAKE 1 TABLET BY MOUTH EVERY DAY 90 tablet 1   metroNIDAZOLE (METROGEL) 1 % gel metronidazole 1 % topical gel  APPLY TO AFFECTED AREA EVERY DAY     montelukast (SINGULAIR) 10 MG tablet TAKE 1 TABLET BY MOUTH EVERYDAY AT BEDTIME 90 tablet 1   Multiple Vitamin (MULTIVITAMIN) capsule Take 1 capsule by mouth daily.     Pyridoxine HCl 200 MG TBCR Take by mouth.     torsemide (DEMADEX) 5 MG  tablet TAKE 1 TABLET BY MOUTH EVERY DAY 90 tablet 1   TRELEGY ELLIPTA 200-62.5-25 MCG/ACT AEPB Inhale 1 puff into the lungs daily.     pantoprazole (PROTONIX) 40 MG tablet TAKE 1 TABLET BY MOUTH EVERY DAY BEFORE A MEAL AS DIRECTED 90 tablet 3   No current facility-administered medications on file prior to visit.     ROS see history of present illness  Objective  Physical Exam Vitals:   02/26/22 1337  BP: 126/84  Pulse: 79  Temp: 98 F (36.7 C)  SpO2: 98%    BP Readings from Last 3 Encounters:  02/26/22 126/84  12/09/21 130/80  12/29/20 126/84   Wt Readings from Last 3 Encounters:  02/26/22 222 lb (100.7 kg)  01/26/22 213 lb (96.6 kg)  12/09/21 213 lb 3.2 oz (96.7 kg)    Physical Exam Constitutional:      General: He is not in acute distress.    Appearance: He  is not diaphoretic.  Cardiovascular:     Rate and Rhythm: Normal rate and regular rhythm.     Heart sounds: Normal heart sounds.  Pulmonary:     Effort: Pulmonary effort is normal.     Breath sounds: Normal breath sounds.  Musculoskeletal:     Comments: Left leg with 1+ pitting edema, right leg with no edema, left calf measures 41.5 cm, right calf measures 40.5 cm  Skin:    General: Skin is warm and dry.  Neurological:     Mental Status: He is alert.    EKG: Sinus rhythm, rate 75, right bundle branch block, no significant changes from his last EKG  Assessment/Plan: Please see individual problem list.  Problem List Items Addressed This Visit     DOE (dyspnea on exertion) - Primary    Patient symptoms could be related to multiple causes.  His swelling could be related to venous insufficiency, varicose veins, CHF, DVT, or be related to several other issues.  Shortness of breath could be cardiac or pulmonary.  CHF would seem to be the leading candidate for his symptoms given improvement in his swelling with torsemide.  Discussed work-up including EKG, lab work, and possible imaging depending on what the lab work reveals.  His EKG is stable and reassuring.  We will get lab work today and determine the next step in management.  Consider cardiology referral once labs return.  Advised if he develop persistent chest pain or shortness of breath he needs to go to the emergency department.      Relevant Orders   EKG 12-Lead (Completed)   Comp Met (CMET)   TSH   D-Dimer, Quantitative   B Nat Peptide   CBC     Return for as scheduled.   Alexander Rumps, MD [place]

## 2022-02-27 LAB — D-DIMER, QUANTITATIVE: D-Dimer, Quant: 0.35 mcg/mL FEU (ref <0.50)

## 2022-03-01 ENCOUNTER — Encounter: Payer: Self-pay | Admitting: Cardiology

## 2022-03-01 ENCOUNTER — Ambulatory Visit: Payer: Medicare HMO | Attending: Cardiovascular Disease | Admitting: Cardiology

## 2022-03-01 VITALS — BP 140/86 | HR 80 | Ht 69.0 in | Wt 244.2 lb

## 2022-03-01 DIAGNOSIS — R0609 Other forms of dyspnea: Secondary | ICD-10-CM

## 2022-03-01 DIAGNOSIS — I10 Essential (primary) hypertension: Secondary | ICD-10-CM | POA: Diagnosis not present

## 2022-03-01 DIAGNOSIS — R072 Precordial pain: Secondary | ICD-10-CM | POA: Diagnosis not present

## 2022-03-01 MED ORDER — IVABRADINE HCL 5 MG PO TABS: 15.0000 mg | ORAL_TABLET | Freq: Once | ORAL | 0 refills | Status: AC

## 2022-03-01 MED ORDER — METOPROLOL TARTRATE 100 MG PO TABS: 100.0000 mg | ORAL_TABLET | Freq: Once | ORAL | 0 refills | Status: DC

## 2022-03-01 NOTE — Patient Instructions (Signed)
Medication Instructions:   Your physician recommends that you continue on your current medications as directed. Please refer to the Current Medication list given to you today.   *If you need a refill on your cardiac medications before your next appointment, please call your pharmacy*   Testing/Procedures:  Your physician has requested that you have an echocardiogram. Echocardiography is a painless test that uses sound waves to create images of your heart. It provides your doctor with information about the size and shape of your heart and how well your heart's chambers and valves are working. This procedure takes approximately one hour. There are no restrictions for this procedure. Please do NOT wear cologne, perfume, aftershave, or lotions (deodorant is allowed). Please arrive 15 minutes prior to your appointment time.   2.   Your physician has requested that you have cardiac CT. Cardiac computed tomography (CT) is a painless test that uses an x-ray machine to take clear, detailed pictures of your heart.    Your cardiac CT will be scheduled at:  [facility] [address] [place], [place] [address] [phone]   Thursday 03/18/22 at 8:45   Please arrive 15 mins early for check-in and test prep.    Please follow these instructions carefully (unless otherwise directed):   Hold all erectile dysfunction medications at least 3 days (72 hrs) prior to test. (Ie viagra, cialis, sildenafil, tadalafil, etc) We will administer nitroglycerin during this exam.    Night Before the Test: Be sure to Drink plenty of water. Do not consume any caffeinated/decaffeinated beverages or chocolate 12 hours prior to your test.   On the Day of the Test: Drink plenty of water until 1 hour prior to the test. Do not eat any food 4 hours prior to the test. You may take your regular medications prior to the test.  Take metoprolol (Lopressor) 100 MG two  hours prior to test. Take Ivabradine (Corlanor) 15 MG two hours priot to test. HOLD your Torsemide the morning of the test.        After the Test: Drink plenty of water. After receiving IV contrast, you may experience a mild flushed feeling. This is normal. On occasion, you may experience a mild rash up to 24 hours after the test. This is not dangerous. If this occurs, you can take Benadryl 25 mg and increase your fluid intake. If you experience trouble breathing, this can be serious. If it is severe call 911 IMMEDIATELY. If it is mild, please call our office. If you take any of these medications: Glipizide/Metformin, Avandament, Glucavance, please do not take 48 hours after completing test unless otherwise instructed.  Please allow 2-4 weeks for scheduling of routine cardiac CTs. Some insurance companies require a pre-authorization which may delay scheduling of this test.   For non-scheduling related questions, please contact the cardiac imaging nurse navigator should you have any questions/concerns: Marchia Bond, Cardiac Imaging Nurse Navigator Gordy Clement, Cardiac Imaging Nurse Navigator [facility] Direct Office Dial: [phone]   For scheduling needs, including cancellations and rescheduling, please call [place], [phone].   Follow-Up: At [facility], you and your health needs are our priority.  As part of our continuing mission to provide you with exceptional heart care, we have created designated Provider Care Teams.  These Care Teams include your primary Cardiologist (physician) and Advanced Practice Providers (APPs -  Physician Assistants and Nurse Practitioners) who all work together to provide you with the care  you need, when you need it.  We recommend signing up for the patient portal called "MyChart".  Sign up information is provided on this After Visit Summary.  MyChart is used to connect with patients for Virtual Visits  (Telemedicine).  Patients are able to view lab/test results, encounter notes, upcoming appointments, etc.  Non-urgent messages can be sent to your provider as well.   To learn more about what you can do with MyChart, go to [email or web address].    Your next appointment:   Follow up after testing   The format for your next appointment:   In Person  Provider:   You may see Kate Sable, MD or one of the following Advanced Practice Providers on your designated Care Team:   Murray Hodgkins, NP Christell Faith, PA-C Cadence Kathlen Mody, PA-C Gerrie Nordmann, NP    Other Instructions   Important Information About Sugar

## 2022-03-01 NOTE — Progress Notes (Signed)
Cardiology Office Note:    Date:  03/01/2022   ID:  Alexander Fitzgerald, DOB [birth year], MRN [id number]  PCP:  Leone Haven, MD   [facility] Cardiologist:  Kate Sable, MD     Referring MD: Leone Haven, MD   Chief Complaint  Patient presents with   New Patient (Initial Visit)    Reestablish care, former [place] p/t, had Covid Feb 2023 having issues with leg swelling, chest pain goes away with rest, SOB    History of Present Illness:    Alexander Fitzgerald is a 76 y.o. male with a hx of hypertension, asthma, former smoker x20+ years who presents to establish care.  Patient complains of shortness of breath ongoing over the past month.  Walking a regular previous feels okay, he usually gets more out of breath when he goes up a flight of stairs or overexerts himself.  He occasionally has nonspecific left-sided chest pain feels like he pulled some muscle.  Father had a heart attack at age 53.  He notices leg edema improved with torsemide prescribed by PCP.  Blood work obtained by primary care physician including D-dimer, BMP within normal limits.  Shortness of breath overall is improved with standing regimen for asthma, including an injectable.  Uses several inhalers.  Prior notes Calcium score 2017- 106  Past Medical History:  Diagnosis Date   Asthma    adult onset, PFTs done in [facility])    Several moles removed in past, all pre-cancerous.  Recently followed by Dr. Nehemiah Massed   Depression    Treated with Prozac in 1992,  Associate with job loss   Esophageal reflux    Gout    Hiatal hernia    Hypertension    Squamous cell carcinoma of skin 07/30/2015   R anterior lat neck, Atypical squamous proliferation, excised 08/28/2015    Past Surgical History:  Procedure Laterality Date   APPENDECTOMY     COLONOSCOPY N/A 12/27/2014   Procedure: COLONOSCOPY;  Surgeon: Lollie Sails, MD;  Location: [facility] ENDOSCOPY;  Service: Endoscopy;   Laterality: N/A;   LAPAROSCOPIC PARAESOPHAGEAL HERNIA REPAIR     TONSILLECTOMY      Current Medications: Current Meds  Medication Sig   albuterol (VENTOLIN HFA) 108 (90 Base) MCG/ACT inhaler INHALE 2 PUFFS 4 TIMES A DAY AS NEEDED   Ascorbic Acid (VITAMIN C) 1000 MG tablet Take 1,000 mg by mouth daily. Reported on 06/26/2015   Cholecalciferol (VITAMIN D3) 2000 units capsule Take 4,000 Units by mouth.   Cyanocobalamin (VITAMIN B-12) 5000 MCG SUBL Place 2 each under the tongue daily.   cyclobenzaprine (FLEXERIL) 10 MG tablet Take 1 tablet (10 mg total) by mouth 3 (three) times daily as needed for muscle spasms.   Dapsone 5 % topical gel Aczone 5 % topical gel  APPLY A SMALL AMOUNT TO AFFECTED AREA EVERY MORNING   docusate sodium (COLACE) 100 MG capsule Take 100 mg by mouth daily.   doxycycline (VIBRAMYCIN) 50 MG capsule TAKE 1 CAPSULE BY MOUTH EVERY DAY   DUPIXENT 300 MG/2ML SOPN Inject into the skin.   finasteride (PROSCAR) 5 MG tablet TAKE 1 TABLET BY MOUTH EVERY DAY   fluticasone (FLONASE) 50 MCG/ACT nasal spray SPRAY 2 SPRAYS INTO EACH NOSTRIL EVERY DAY   ipratropium (ATROVENT) 0.03 % nasal spray Place 2 sprays into both nostrils every 12 (twelve) hours.   ivabradine (CORLANOR) 5 MG TABS tablet Take 3 tablets (15 mg total) by mouth once  for 1 dose. Take 2 hours prior to CT Scan   ketoconazole (NIZORAL) 2 % shampoo Shampoo from the waist up let sit 5 minutes then wash off. Use twice per week for 2 mths. Then QM there after.   levothyroxine (SYNTHROID) 112 MCG tablet TAKE 1TAB BY MOUTH ONCE DAILY ON EMPTY STOMACH WITH A GLASS OF WATER 30-60MIN BEFORE BREAKFAST   lisinopril (ZESTRIL) 5 MG tablet TAKE 1 TABLET BY MOUTH EVERY DAY   metoprolol tartrate (LOPRESSOR) 100 MG tablet Take 1 tablet (100 mg total) by mouth once for 1 dose. Take 2 hours prior to your CT scan.   metroNIDAZOLE (METROGEL) 1 % gel metronidazole 1 % topical gel  APPLY TO AFFECTED AREA EVERY DAY   montelukast (SINGULAIR) 10 MG  tablet TAKE 1 TABLET BY MOUTH EVERYDAY AT BEDTIME   Multiple Vitamin (MULTIVITAMIN) capsule Take 1 capsule by mouth daily.   Pyridoxine HCl 200 MG TBCR Take by mouth.   torsemide (DEMADEX) 5 MG tablet TAKE 1 TABLET BY MOUTH EVERY DAY   TRELEGY ELLIPTA 200-62.5-25 MCG/ACT AEPB Inhale 1 puff into the lungs daily.     Allergies:   Sulfa drugs cross reactors   Social History   Socioeconomic History   Marital status: Married    Spouse name: Not on file   Number of children: Not on file   Years of education: Not on file   Highest education level: Not on file  Occupational History   Not on file  Tobacco Use   Smoking status: Former    Packs/day: 0.50    Years: 20.00    Total pack years: 10.00    Types: Cigarettes    Quit date: 06/15/1999    Years since quitting: 22.7   Smokeless tobacco: Never  Substance and Sexual Activity   Alcohol use: Yes    Alcohol/week: 1.0 standard drink of alcohol    Types: 1 Standard drinks or equivalent per week    Comment: rarely   Drug use: No   Sexual activity: Yes  Other Topics Concern   Not on file  Social History Narrative   Not on file   Social Determinants of Health   Financial Resource Strain: Low Risk  (01/26/2022)   Overall Financial Resource Strain (CARDIA)    Difficulty of Paying Living Expenses: Not hard at all  Food Insecurity: No Food Insecurity (01/26/2022)   Hunger Vital Sign    Worried About Running Out of Food in the Last Year: Never true    Ran Out of Food in the Last Year: Never true  Transportation Needs: No Transportation Needs (01/26/2022)   PRAPARE - Hydrologist (Medical): No    Lack of Transportation (Non-Medical): No  Physical Activity: Sufficiently Active (01/26/2022)   Exercise Vital Sign    Days of Exercise per Week: 7 days    Minutes of Exercise per Session: 30 min  Stress: No Stress Concern Present (01/26/2022)   [place]    Feeling of Stress : Not at all  Social Connections: Unknown (01/26/2022)   Social Connection and Isolation Panel [NHANES]    Frequency of Communication with Friends and Family: More than three times a week    Frequency of Social Gatherings with Friends and Family: More than three times a week    Attends Religious Services: Not on file    Active Member of Clubs or Organizations: Not on file    Attends [employer]  or Organization Meetings: Not on file    Marital Status: Married     Family History: The patient's family history includes Cancer in his sister; Diabetes in his mother; Heart attack (age of onset: 65) in his father; Hyperlipidemia in his mother; Hypertension in his father and mother.  ROS:   Please see the history of present illness.     All other systems reviewed and are negative.  EKGs/Labs/Other Studies Reviewed:    The following studies were reviewed today:   EKG:  EKG is  ordered today.  The ekg ordered today demonstrates normal sinus rhythm, right bundle branch block  Recent Labs: 02/26/2022: ALT 34; BUN 16; Creatinine, Ser 1.06; Hemoglobin 15.5; Platelets 221.0; Potassium 4.5; Pro B Natriuretic peptide (BNP) 40.0; Sodium 139; TSH 0.05  Recent Lipid Panel    Component Value Date/Time   CHOL 181 12/29/2020 1028   CHOL 142 03/01/2016 0929   TRIG 178.0 (H) 12/29/2020 1028   HDL 46.20 12/29/2020 1028   HDL 49 03/01/2016 0929   CHOLHDL 4 12/29/2020 1028   VLDL 35.6 12/29/2020 1028   LDLCALC 99 12/29/2020 1028   LDLCALC 70 03/01/2016 0929     Risk Assessment/Calculations:     HYPERTENSION CONTROL Vitals:   03/01/22 1149 03/01/22 1155  BP: (!) 142/86 (!) 140/86    The patient's blood pressure is elevated above target today.  In order to address the patient's elevated BP: Blood pressure will be monitored at home to determine if medication changes need to be made.            Physical Exam:    VS:  BP (!) 140/86 (BP Location: Right Arm)    Pulse 80   Ht '5\' 9"'$  (1.753 m)   Wt 244 lb 3.2 oz (110.8 kg)   SpO2 97%   BMI 36.06 kg/m     Wt Readings from Last 3 Encounters:  03/01/22 244 lb 3.2 oz (110.8 kg)  02/26/22 222 lb (100.7 kg)  01/26/22 213 lb (96.6 kg)     GEN:  Well nourished, well developed in no acute distress HEENT: Normal NECK: No JVD; No carotid bruits CARDIAC: RRR, no murmurs, rubs, gallops RESPIRATORY:  Clear to auscultation without rales, wheezing or rhonchi  ABDOMEN: Soft, non-tender, non-distended MUSCULOSKELETAL:  No edema; No deformity  SKIN: Warm and dry NEUROLOGIC:  Alert and oriented x 3 PSYCHIATRIC:  Normal affect   ASSESSMENT:    1. Precordial pain   2. Dyspnea on exertion   3. Primary hypertension    PLAN:    In order of problems listed above:  Chest pain, risk factors hypertension, former smoker, family history of CAD.  Get echo, get coronary CTA. Dyspnea on exertion, work-up as above.  Pulmonary etiology//mild might be contributing. Hypertension, BP controlled, continue lisinopril 5 mg daily.  Follow-up after echo and coronary CTA.       Medication Adjustments/Labs and Tests Ordered: Current medicines are reviewed at length with the patient today.  Concerns regarding medicines are outlined above.  Orders Placed This Encounter  Procedures   CT CORONARY MORPH W/CTA COR W/SCORE W/CA W/CM &/OR WO/CM   EKG 12-Lead   ECHOCARDIOGRAM COMPLETE   Meds ordered this encounter  Medications   metoprolol tartrate (LOPRESSOR) 100 MG tablet    Sig: Take 1 tablet (100 mg total) by mouth once for 1 dose. Take 2 hours prior to your CT scan.    Dispense:  1 tablet    Refill:  0   ivabradine (  CORLANOR) 5 MG TABS tablet    Sig: Take 3 tablets (15 mg total) by mouth once for 1 dose. Take 2 hours prior to CT Scan    Dispense:  3 tablet    Refill:  0    Patient Instructions  Medication Instructions:   Your physician recommends that you continue on your current medications as directed.  Please refer to the Current Medication list given to you today.   *If you need a refill on your cardiac medications before your next appointment, please call your pharmacy*   Testing/Procedures:  Your physician has requested that you have an echocardiogram. Echocardiography is a painless test that uses sound waves to create images of your heart. It provides your doctor with information about the size and shape of your heart and how well your heart's chambers and valves are working. This procedure takes approximately one hour. There are no restrictions for this procedure. Please do NOT wear cologne, perfume, aftershave, or lotions (deodorant is allowed). Please arrive 15 minutes prior to your appointment time.   2.   Your physician has requested that you have cardiac CT. Cardiac computed tomography (CT) is a painless test that uses an x-ray machine to take clear, detailed pictures of your heart.    Your cardiac CT will be scheduled at:  [facility] [address] [place], [place] [address] [phone]   Thursday 03/18/22 at 8:45   Please arrive 15 mins early for check-in and test prep.    Please follow these instructions carefully (unless otherwise directed):   Hold all erectile dysfunction medications at least 3 days (72 hrs) prior to test. (Ie viagra, cialis, sildenafil, tadalafil, etc) We will administer nitroglycerin during this exam.    Night Before the Test: Be sure to Drink plenty of water. Do not consume any caffeinated/decaffeinated beverages or chocolate 12 hours prior to your test.   On the Day of the Test: Drink plenty of water until 1 hour prior to the test. Do not eat any food 4 hours prior to the test. You may take your regular medications prior to the test.  Take metoprolol (Lopressor) 100 MG two hours prior to test. Take Ivabradine (Corlanor) 15 MG two hours priot to test. HOLD your Torsemide the morning of  the test.        After the Test: Drink plenty of water. After receiving IV contrast, you may experience a mild flushed feeling. This is normal. On occasion, you may experience a mild rash up to 24 hours after the test. This is not dangerous. If this occurs, you can take Benadryl 25 mg and increase your fluid intake. If you experience trouble breathing, this can be serious. If it is severe call 911 IMMEDIATELY. If it is mild, please call our office. If you take any of these medications: Glipizide/Metformin, Avandament, Glucavance, please do not take 48 hours after completing test unless otherwise instructed.  Please allow 2-4 weeks for scheduling of routine cardiac CTs. Some insurance companies require a pre-authorization which may delay scheduling of this test.   For non-scheduling related questions, please contact the cardiac imaging nurse navigator should you have any questions/concerns: Marchia Bond, Cardiac Imaging Nurse Navigator Gordy Clement, Cardiac Imaging Nurse Navigator [facility] Direct Office Dial: [phone]   For scheduling needs, including cancellations and rescheduling, please call [place], [phone].   Follow-Up: At [facility], you and your health needs are our priority.  As part of  our continuing mission to provide you with exceptional heart care, we have created designated Provider Care Teams.  These Care Teams include your primary Cardiologist (physician) and Advanced Practice Providers (APPs -  Physician Assistants and Nurse Practitioners) who all work together to provide you with the care you need, when you need it.  We recommend signing up for the patient portal called "MyChart".  Sign up information is provided on this After Visit Summary.  MyChart is used to connect with patients for Virtual Visits (Telemedicine).  Patients are able to view lab/test results, encounter notes, upcoming appointments, etc.  Non-urgent  messages can be sent to your provider as well.   To learn more about what you can do with MyChart, go to [email or web address].    Your next appointment:   Follow up after testing   The format for your next appointment:   In Person  Provider:   You may see Kate Sable, MD or one of the following Advanced Practice Providers on your designated Care Team:   Murray Hodgkins, NP Christell Faith, PA-C Cadence Kathlen Mody, PA-C Gerrie Nordmann, NP    Other Instructions   Important Information About Sugar         Signed, Kate Sable, MD  03/01/2022 2:09 PM    [place]

## 2022-03-02 ENCOUNTER — Encounter: Payer: Self-pay | Admitting: Family Medicine

## 2022-03-02 ENCOUNTER — Other Ambulatory Visit: Payer: Self-pay

## 2022-03-02 DIAGNOSIS — E890 Postprocedural hypothyroidism: Secondary | ICD-10-CM

## 2022-03-02 DIAGNOSIS — I10 Essential (primary) hypertension: Secondary | ICD-10-CM

## 2022-03-04 DIAGNOSIS — G4733 Obstructive sleep apnea (adult) (pediatric): Secondary | ICD-10-CM | POA: Diagnosis not present

## 2022-03-08 ENCOUNTER — Ambulatory Visit: Payer: Medicare HMO | Attending: Cardiology

## 2022-03-08 DIAGNOSIS — R072 Precordial pain: Secondary | ICD-10-CM | POA: Diagnosis not present

## 2022-03-08 DIAGNOSIS — R0609 Other forms of dyspnea: Secondary | ICD-10-CM

## 2022-03-08 LAB — ECHOCARDIOGRAM COMPLETE
AR max vel: 2.39 cm2
AV Peak grad: 9.1 mmHg
Ao pk vel: 1.51 m/s
Area-P 1/2: 3.08 cm2
Calc EF: 65.4 %
MV M vel: 3.23 m/s
MV Peak grad: 41.7 mmHg
S' Lateral: 2.5 cm
Single Plane A2C EF: 65.6 %
Single Plane A4C EF: 66.6 %

## 2022-03-08 NOTE — Addendum Note (Signed)
Addended by: Caryl Bis, Gregor Dershem G on: 03/08/2022 10:40 AM   Modules accepted: Orders

## 2022-03-09 DIAGNOSIS — H6121 Impacted cerumen, right ear: Secondary | ICD-10-CM | POA: Diagnosis not present

## 2022-03-09 DIAGNOSIS — K219 Gastro-esophageal reflux disease without esophagitis: Secondary | ICD-10-CM | POA: Diagnosis not present

## 2022-03-09 DIAGNOSIS — J342 Deviated nasal septum: Secondary | ICD-10-CM | POA: Diagnosis not present

## 2022-03-10 MED ORDER — TORSEMIDE 10 MG PO TABS: 10.0000 mg | ORAL_TABLET | Freq: Every day | ORAL | 1 refills | Status: DC

## 2022-03-11 ENCOUNTER — Other Ambulatory Visit (INDEPENDENT_AMBULATORY_CARE_PROVIDER_SITE_OTHER): Payer: Medicare HMO | Admitting: Podiatry

## 2022-03-11 DIAGNOSIS — M1A0790 Idiopathic chronic gout, unspecified ankle and foot, without tophus (tophi): Secondary | ICD-10-CM

## 2022-03-11 DIAGNOSIS — L03115 Cellulitis of right lower limb: Secondary | ICD-10-CM

## 2022-03-11 MED ORDER — CEPHALEXIN 500 MG PO CAPS: 500.0000 mg | ORAL_CAPSULE | Freq: Three times a day (TID) | ORAL | 0 refills | Status: AC

## 2022-03-11 MED ORDER — COLCHICINE 0.6 MG PO TABS: 0.6000 mg | ORAL_TABLET | Freq: Every day | ORAL | 0 refills | Status: DC

## 2022-03-11 MED ORDER — CEPHALEXIN 500 MG PO CAPS: 500.0000 mg | ORAL_CAPSULE | Freq: Three times a day (TID) | ORAL | 0 refills | Status: DC

## 2022-03-11 NOTE — Progress Notes (Signed)
Pt called answering service stating red swollen painful great toe and joint. Called and states redness spreading to other toes. Denies open wounds, states had his nails trimmed a month ago and thinks couldve gotten infection from that. I stated unlikely nail trim that long ago would be a source of infection. He also mentions a history of gout. Denies N/V/F/C. Will send Rx for colchicine for possible gout flare as well as cephalexin for possible cellulitis. Pt instructed that if symptoms worsen after taking medications to go to the emergency department. Should call to follow up next week to be evaluated.

## 2022-03-12 ENCOUNTER — Encounter: Payer: Self-pay | Admitting: Podiatry

## 2022-03-17 ENCOUNTER — Telehealth (HOSPITAL_COMMUNITY): Payer: Self-pay | Admitting: Emergency Medicine

## 2022-03-17 NOTE — Telephone Encounter (Signed)
Reaching out to patient to offer assistance regarding upcoming cardiac imaging study; pt verbalizes understanding of appt date/time, parking situation and where to check in, pre-test NPO status and medications ordered, and verified current allergies; name and call back number provided for further questions should they arise Marchia Bond RN Navigator Cardiac Imaging [facility] [phone] office [phone] cell  Arrival 815 Denies iv issues '100mg'$  metoprolol + '15mg'$  ivabradine Holding allergy meds, torsemide

## 2022-03-18 ENCOUNTER — Ambulatory Visit (INDEPENDENT_AMBULATORY_CARE_PROVIDER_SITE_OTHER): Payer: Medicare HMO | Admitting: Dermatology

## 2022-03-18 ENCOUNTER — Ambulatory Visit
Admission: RE | Admit: 2022-03-18 | Discharge: 2022-03-18 | Disposition: A | Discharge status: Home / Self Care | Payer: Medicare HMO | Source: Ambulatory Visit | Attending: Cardiology | Admitting: Cardiology

## 2022-03-18 DIAGNOSIS — L570 Actinic keratosis: Secondary | ICD-10-CM | POA: Diagnosis not present

## 2022-03-18 DIAGNOSIS — R072 Precordial pain: Secondary | ICD-10-CM | POA: Diagnosis not present

## 2022-03-18 DIAGNOSIS — L578 Other skin changes due to chronic exposure to nonionizing radiation: Secondary | ICD-10-CM | POA: Diagnosis not present

## 2022-03-18 MED ORDER — IOHEXOL 350 MG/ML SOLN
100.0000 mL | Freq: Once | INTRAVENOUS | Status: AC | PRN
  Administered 2022-03-18: 100 mL via INTRAVENOUS

## 2022-03-18 MED ORDER — NITROGLYCERIN 0.4 MG SL SUBL
0.8000 mg | SUBLINGUAL_TABLET | Freq: Once | SUBLINGUAL | Status: AC
  Administered 2022-03-18: 0.8 mg via SUBLINGUAL

## 2022-03-18 NOTE — Progress Notes (Signed)
Patient tolerated procedure well. Ambulate w/o difficulty. Denies light headedness or being dizzy. Sitting in chair drinking water provided. Encouraged to drink extra water today and reasoning explained. Verbalized understanding. All questions answered. ABC intact. No further needs. Discharge from procedure area w/o issues.   °

## 2022-03-18 NOTE — Progress Notes (Signed)
   Follow-Up Visit   Subjective  Alexander Fitzgerald is a 76 y.o. male who presents for the following: Actinic Keratosis (6 month ak follow up. Hx of ak at scalp face and ears. Reports a rough area at forehead he would like checked. ). The patient has spots, moles and lesions to be evaluated, some may be new or changing and the patient has concerns that these could be cancer.  The following portions of the chart were reviewed this encounter and updated as appropriate:  Tobacco  Allergies  Meds  Problems  Med Hx  Surg Hx  Fam Hx     Review of Systems: No other skin or systemic complaints except as noted in HPI or Assessment and Plan.  Objective  Well appearing patient in no apparent distress; mood and affect are within normal limits.  A focused examination was performed including face, scalp, ears, arms. Relevant physical exam findings are noted in the Assessment and Plan.  face and scalp x 8 (8) Erythematous thin papules/macules with gritty scale.    Assessment & Plan  Actinic keratosis (8) face and scalp x 8 Actinic keratoses are precancerous spots that appear secondary to cumulative UV radiation exposure/sun exposure over time. They are chronic with expected duration over 1 year. A portion of actinic keratoses will progress to squamous cell carcinoma of the skin. It is not possible to reliably predict which spots will progress to skin cancer and so treatment is recommended to prevent development of skin cancer.  Recommend daily broad spectrum sunscreen SPF 30+ to sun-exposed areas, reapply every 2 hours as needed.  Recommend staying in the shade or wearing long sleeves, sun glasses (UVA+UVB protection) and wide brim hats (4-inch brim around the entire circumference of the hat). Call for new or changing lesions.  Destruction of lesion - face and scalp x 8 Complexity: simple   Destruction method: cryotherapy   Informed consent: discussed and consent obtained   Timeout:  patient  name, date of birth, surgical site, and procedure verified Lesion destroyed using liquid nitrogen: Yes   Region frozen until ice ball extended beyond lesion: Yes   Outcome: patient tolerated procedure well with no complications   Post-procedure details: wound care instructions given   Additional details:  Prior to procedure, discussed risks of blister formation, small wound, skin dyspigmentation, or rare scar following cryotherapy. Recommend Vaseline ointment to treated areas while healing.  Actinic Damage - chronic, secondary to cumulative UV radiation exposure/sun exposure over time - diffuse scaly erythematous macules with underlying dyspigmentation - Recommend daily broad spectrum sunscreen SPF 30+ to sun-exposed areas, reapply every 2 hours as needed.  - Recommend staying in the shade or wearing long sleeves, sun glasses (UVA+UVB protection) and wide brim hats (4-inch brim around the entire circumference of the hat). - Call for new or changing lesions.  Return in about 6 months (around 09/16/2022) for ak followup. IRuthell Rummage, CMA, am acting as scribe for Sarina Ser, MD. Documentation: I have reviewed the above documentation for accuracy and completeness, and I agree with the above.  Sarina Ser, MD

## 2022-03-18 NOTE — Patient Instructions (Addendum)
Actinic keratoses are precancerous spots that appear secondary to cumulative UV radiation exposure/sun exposure over time. They are chronic with expected duration over 1 year. A portion of actinic keratoses will progress to squamous cell carcinoma of the skin. It is not possible to reliably predict which spots will progress to skin cancer and so treatment is recommended to prevent development of skin cancer.  Recommend daily broad spectrum sunscreen SPF 30+ to sun-exposed areas, reapply every 2 hours as needed.  Recommend staying in the shade or wearing long sleeves, sun glasses (UVA+UVB protection) and wide brim hats (4-inch brim around the entire circumference of the hat). Call for new or changing lesions.   Cryotherapy Aftercare  Wash gently with soap and water everyday.   Apply Vaseline and Band-Aid daily until healed.   Due to recent changes in healthcare laws, you may see results of your pathology and/or laboratory studies on MyChart before the doctors have had a chance to review them. We understand that in some cases there may be results that are confusing or concerning to you. Please understand that not all results are received at the same time and often the doctors may need to interpret multiple results in order to provide you with the best plan of care or course of treatment. Therefore, we ask that you please give us 2 business days to thoroughly review all your results before contacting the office for clarification. Should we see a critical lab result, you will be contacted sooner.   If You Need Anything After Your Visit  If you have any questions or concerns for your doctor, please call our main line at [phone] and press option 4 to reach your doctor's medical assistant. If no one answers, please leave a voicemail as directed and we will return your call as soon as possible. Messages left after 4 pm will be answered the following business day.   You may also send us a message via  MyChart. We typically respond to MyChart messages within 1-2 business days.  For prescription refills, please ask your pharmacy to contact our office. Our fax number is [phone].  If you have an urgent issue when the clinic is closed that cannot wait until the next business day, you can page your doctor at the number below.    Please note that while we do our best to be available for urgent issues outside of office hours, we are not available 24/7.   If you have an urgent issue and are unable to reach us, you may choose to seek medical care at your doctor's office, retail clinic, urgent care center, or emergency room.  If you have a medical emergency, please immediately call 911 or go to the emergency department.  Pager Numbers  - Dr. Kowalski: [phone]  - Dr. Moye: [phone]  - Dr. Stewart: [phone]  In the event of inclement weather, please call our main line at [phone] for an update on the status of any delays or closures.  Dermatology Medication Tips: Please keep the boxes that topical medications come in in order to help keep track of the instructions about where and how to use these. Pharmacies typically print the medication instructions only on the boxes and not directly on the medication tubes.   If your medication is too expensive, please contact our office at [phone] option 4 or send us a message through MyChart.   We are unable to tell what your co-pay for medications will be in advance as this is different   depending on your insurance coverage. However, we may be able to find a substitute medication at lower cost or fill out paperwork to get insurance to cover a needed medication.   If a prior authorization is required to get your medication covered by your insurance company, please allow us 1-2 business days to complete this process.  Drug prices often vary depending on where the prescription is filled and some pharmacies may offer cheaper  prices.  The website www.goodrx.com contains coupons for medications through different pharmacies. The prices here do not account for what the cost may be with help from insurance (it may be cheaper with your insurance), but the website can give you the price if you did not use any insurance.  - You can print the associated coupon and take it with your prescription to the pharmacy.  - You may also stop by our office during regular business hours and pick up a GoodRx coupon card.  - If you need your prescription sent electronically to a different pharmacy, notify our office through [facility] MyChart or by phone at [phone] option 4.     Si Usted Necesita Algo Despus de Su Visita  Tambin puede enviarnos un mensaje a travs de MyChart. Por lo general respondemos a los mensajes de MyChart en el transcurso de 1 a 2 das hbiles.  Para renovar recetas, por favor pida a su farmacia que se ponga en contacto con nuestra oficina. Nuestro nmero de fax es el [phone].  Si tiene un asunto urgente cuando la clnica est cerrada y que no puede esperar hasta el siguiente da hbil, puede llamar/localizar a su doctor(a) al nmero que aparece a continuacin.   Por favor, tenga en cuenta que aunque hacemos todo lo posible para estar disponibles para asuntos urgentes fuera del horario de oficina, no estamos disponibles las 24 horas del da, los 7 das de la semana.   Si tiene un problema urgente y no puede comunicarse con nosotros, puede optar por buscar atencin mdica  en el consultorio de su doctor(a), en una clnica privada, en un centro de atencin urgente o en una sala de emergencias.  Si tiene una emergencia mdica, por favor llame inmediatamente al 911 o vaya a la sala de emergencias.  Nmeros de bper  - Dr. Kowalski: [phone]  - Dra. Moye: [phone]  - Dra. Stewart: [phone]  En caso de inclemencias del tiempo, por favor llame a nuestra lnea principal al [phone]  para una actualizacin sobre el estado de cualquier retraso o cierre.  Consejos para la medicacin en dermatologa: Por favor, guarde las cajas en las que vienen los medicamentos de uso tpico para ayudarle a seguir las instrucciones sobre dnde y cmo usarlos. Las farmacias generalmente imprimen las instrucciones del medicamento slo en las cajas y no directamente en los tubos del medicamento.   Si su medicamento es muy caro, por favor, pngase en contacto con nuestra oficina llamando al [phone] y presione la opcin 4 o envenos un mensaje a travs de MyChart.   No podemos decirle cul ser su copago por los medicamentos por adelantado ya que esto es diferente dependiendo de la cobertura de su seguro. Sin embargo, es posible que podamos encontrar un medicamento sustituto a menor costo o llenar un formulario para que el seguro cubra el medicamento que se considera necesario.   Si se requiere una autorizacin previa para que su compaa de seguros cubra su medicamento, por favor permtanos de 1 a 2 das hbiles para completar este   proceso.  Los precios de los medicamentos varan con frecuencia dependiendo del lugar de dnde se surte la receta y alguna farmacias pueden ofrecer precios ms baratos.  El sitio web www.goodrx.com tiene cupones para medicamentos de diferentes farmacias. Los precios aqu no tienen en cuenta lo que podra costar con la ayuda del seguro (puede ser ms barato con su seguro), pero el sitio web puede darle el precio si no utiliz ningn seguro.  - Puede imprimir el cupn correspondiente y llevarlo con su receta a la farmacia.  - Tambin puede pasar por nuestra oficina durante el horario de atencin regular y recoger una tarjeta de cupones de GoodRx.  - Si necesita que su receta se enve electrnicamente a una farmacia diferente, informe a nuestra oficina a travs de MyChart de [place] o por telfono llamando al [phone] y presione la opcin 4.  

## 2022-03-19 ENCOUNTER — Ambulatory Visit: Payer: Medicare HMO | Admitting: Podiatry

## 2022-03-19 ENCOUNTER — Ambulatory Visit (INDEPENDENT_AMBULATORY_CARE_PROVIDER_SITE_OTHER): Payer: Medicare HMO

## 2022-03-19 ENCOUNTER — Telehealth: Payer: Self-pay

## 2022-03-19 DIAGNOSIS — M778 Other enthesopathies, not elsewhere classified: Secondary | ICD-10-CM

## 2022-03-19 DIAGNOSIS — M1A0790 Idiopathic chronic gout, unspecified ankle and foot, without tophus (tophi): Secondary | ICD-10-CM | POA: Diagnosis not present

## 2022-03-19 MED ORDER — ASPIRIN 81 MG PO TBEC: 81.0000 mg | DELAYED_RELEASE_TABLET | Freq: Every day | ORAL | 3 refills | Status: DC

## 2022-03-19 MED ORDER — ATORVASTATIN CALCIUM 20 MG PO TABS: 20.0000 mg | ORAL_TABLET | Freq: Every day | ORAL | 3 refills | Status: DC

## 2022-03-19 MED ORDER — COLCHICINE 0.6 MG PO TABS: 0.6000 mg | ORAL_TABLET | Freq: Every day | ORAL | 0 refills | Status: DC

## 2022-03-19 NOTE — Telephone Encounter (Signed)
-----   Message from Kate Sable, MD sent at 03/19/2022 11:45 AM EST ----- Mild nonobstructive coronary artery disease.  Start aspirin 81 mg daily, Lipitor 20 mg daily.

## 2022-03-19 NOTE — Progress Notes (Signed)
   Chief Complaint  Patient presents with   Foot Problem    Patient is here for right foot swelling, possible gout in right foot.    HPI: 76 y.o. male presenting today for acute flareup of gout to the right great toe joint.  Patient states that he has been on the colchicine for about 10 days now as well as Keflex and there has been some improvement.  He believes the gout was caused by the change in medication.  Presenting for further treatment evaluation  Past Medical History:  Diagnosis Date   Asthma    adult onset, PFTs done in [place] [facility])    Several moles removed in past, all pre-cancerous.  Recently followed by Dr. Nehemiah Massed   Depression    Treated with Prozac in 1992,  Associate with job loss   Esophageal reflux    Gout    Hiatal hernia    Hypertension    Squamous cell carcinoma of skin 07/30/2015   R anterior lat neck, Atypical squamous proliferation, excised 08/28/2015    Past Surgical History:  Procedure Laterality Date   APPENDECTOMY     COLONOSCOPY N/A 12/27/2014   Procedure: COLONOSCOPY;  Surgeon: Lollie Sails, MD;  Location: [facility] ENDOSCOPY;  Service: Endoscopy;  Laterality: N/A;   LAPAROSCOPIC PARAESOPHAGEAL HERNIA REPAIR     TONSILLECTOMY      Allergies  Allergen Reactions   Sulfa Drugs Cross Reactors     Blood in urine     Physical Exam: General: The patient is alert and oriented x3 in no acute distress.  Dermatology: Skin is warm, dry and supple bilateral lower extremities. Negative for open lesions or macerations.  There is a very small focal break in the skin around the medial aspect of the IPJ right great toe with white pasty drainage consistent with monosodium urate crystals/gout.  Vascular: Palpable pedal pulses bilaterally. Capillary refill within normal limits.  Edema with some mild erythema noted to the right great toe joint  Neurological: Light touch and protective threshold grossly intact  Musculoskeletal Exam: No pedal deformities  noted.  Today there is minimal tenderness with palpation to the right great toe.  Overall significant improvement  Radiographic Exam RT foot 03/19/2022:  Normal osseous mineralization.  There is some degenerative changes noted around the IPJ of the right great toe joint.  Soft tissue swelling also noted.  No gas within the tissues.  Assessment: 1.  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 Patient evaluated. X-Rays reviewed.  2.  Continue colchicine 0.6 mg daily.  Refill provided 3.  Order placed for uric acid 4.  If the patient would like to pursue allopurinol recommend follow-up with PCP 5.  Return to clinic as needed      Edrick Kins, DPM [facility]  Dr. Edrick Kins, DPM    [address], [place] [address]                Office [phone]  Fax [phone]

## 2022-03-19 NOTE — Telephone Encounter (Signed)
The patient has been notified of the result and verbalized understanding.  All questions (if any) were answered. Kavin Leech, RN 03/19/2022 3:39 PM

## 2022-03-20 LAB — URIC ACID: Uric Acid: 7.7 mg/dL (ref 3.8–8.4)

## 2022-03-23 DIAGNOSIS — M9903 Segmental and somatic dysfunction of lumbar region: Secondary | ICD-10-CM | POA: Diagnosis not present

## 2022-03-23 DIAGNOSIS — M9905 Segmental and somatic dysfunction of pelvic region: Secondary | ICD-10-CM | POA: Diagnosis not present

## 2022-03-23 DIAGNOSIS — M5417 Radiculopathy, lumbosacral region: Secondary | ICD-10-CM | POA: Diagnosis not present

## 2022-03-23 DIAGNOSIS — M4306 Spondylolysis, lumbar region: Secondary | ICD-10-CM | POA: Diagnosis not present

## 2022-03-24 ENCOUNTER — Encounter: Payer: Self-pay | Admitting: Family Medicine

## 2022-03-24 ENCOUNTER — Other Ambulatory Visit: Payer: Self-pay | Admitting: Family Medicine

## 2022-03-25 MED ORDER — LEVOTHYROXINE SODIUM 100 MCG PO TABS: 100.0000 ug | ORAL_TABLET | Freq: Every day | ORAL | 1 refills | Status: DC

## 2022-04-03 ENCOUNTER — Encounter: Payer: Self-pay | Admitting: Dermatology

## 2022-04-07 DIAGNOSIS — G4733 Obstructive sleep apnea (adult) (pediatric): Secondary | ICD-10-CM | POA: Diagnosis not present

## 2022-04-13 ENCOUNTER — Other Ambulatory Visit (INDEPENDENT_AMBULATORY_CARE_PROVIDER_SITE_OTHER): Payer: Medicare HMO

## 2022-04-13 ENCOUNTER — Encounter: Payer: Self-pay | Admitting: Podiatry

## 2022-04-13 ENCOUNTER — Other Ambulatory Visit: Payer: Self-pay | Admitting: Family Medicine

## 2022-04-13 DIAGNOSIS — M109 Gout, unspecified: Secondary | ICD-10-CM

## 2022-04-13 DIAGNOSIS — E890 Postprocedural hypothyroidism: Secondary | ICD-10-CM

## 2022-04-13 LAB — URIC ACID: Uric Acid, Serum: 8.7 mg/dL — ABNORMAL HIGH (ref 4.0–7.8)

## 2022-04-13 LAB — TSH: TSH: 2.48 u[IU]/mL (ref 0.35–5.50)

## 2022-04-14 ENCOUNTER — Encounter: Payer: Self-pay | Admitting: Family Medicine

## 2022-04-14 ENCOUNTER — Encounter: Payer: Self-pay | Admitting: Physician Assistant

## 2022-04-14 DIAGNOSIS — M1A9XX0 Chronic gout, unspecified, without tophus (tophi): Secondary | ICD-10-CM

## 2022-04-15 MED ORDER — ALLOPURINOL 100 MG PO TABS: 100.0000 mg | ORAL_TABLET | Freq: Every day | ORAL | 6 refills | Status: DC

## 2022-04-15 MED ORDER — COLCHICINE 0.6 MG PO TABS: 0.6000 mg | ORAL_TABLET | Freq: Every day | ORAL | 0 refills | Status: DC

## 2022-04-15 NOTE — Telephone Encounter (Signed)
LVM for patient to call back to schedule a lab appointment in 3 weeks.  Dasja Brase,cma

## 2022-04-20 DIAGNOSIS — M4306 Spondylolysis, lumbar region: Secondary | ICD-10-CM | POA: Diagnosis not present

## 2022-04-20 DIAGNOSIS — M9905 Segmental and somatic dysfunction of pelvic region: Secondary | ICD-10-CM | POA: Diagnosis not present

## 2022-04-20 DIAGNOSIS — M9903 Segmental and somatic dysfunction of lumbar region: Secondary | ICD-10-CM | POA: Diagnosis not present

## 2022-04-20 DIAGNOSIS — M5417 Radiculopathy, lumbosacral region: Secondary | ICD-10-CM | POA: Diagnosis not present

## 2022-04-20 NOTE — Telephone Encounter (Signed)
Please make sure he gets set up for labs in 3 weeks.

## 2022-04-21 ENCOUNTER — Other Ambulatory Visit: Payer: Self-pay | Admitting: Family Medicine

## 2022-04-21 ENCOUNTER — Other Ambulatory Visit: Payer: Self-pay | Admitting: Dermatology

## 2022-04-23 ENCOUNTER — Ambulatory Visit: Payer: Medicare HMO | Attending: Cardiology | Admitting: Cardiology

## 2022-04-23 ENCOUNTER — Encounter: Payer: Self-pay | Admitting: Cardiology

## 2022-04-23 VITALS — BP 138/80 | HR 68 | Ht 71.0 in | Wt 221.5 lb

## 2022-04-23 DIAGNOSIS — I2510 Atherosclerotic heart disease of native coronary artery without angina pectoris: Secondary | ICD-10-CM | POA: Diagnosis not present

## 2022-04-23 DIAGNOSIS — I10 Essential (primary) hypertension: Secondary | ICD-10-CM

## 2022-04-23 NOTE — Progress Notes (Signed)
Cardiology Office Note:    Date:  04/23/2022   ID:  Alexander Fitzgerald, DOB 1945-05-01, MRN [id number]  PCP:  Leone Haven, MD   [facility] Prov[facility] Cardiologist:  Kate Sable, MD     Referring MD: Leone Haven, MD   Chief Complaint  Patient presents with   Other    F/u echo/CT no complaints today. Meds reviewed verbally with pt.    History of Present Illness:    Alexander Fitzgerald is a 77 y.o. male with a hx of hypertension, asthma, former smoker x20+ years who presents for follow-up.    Previously seen due to shortness of breath on exertion.  Because of risk factors, echo and coronary CTA were obtained.  Overall shortness of breath improved with treatment of asthma including injectable.  Presents for testing results.  No new cardiac complaints or concerns today.  States feeling better overall, breathing is much better.  Had a recent gout attack currently managed with allopurinol and colchicine.  Prior notes Calcium score 2017- 106  Past Medical History:  Diagnosis Date   Asthma    adult onset, PFTs done in [place] [facility])    Several moles removed in past, all pre-cancerous.  Recently followed by Dr. Nehemiah Massed   Depression    Treated with Prozac in 1992,  Associate with job loss   Esophageal reflux    Gout    Hiatal hernia    Hypertension    Squamous cell carcinoma of skin 07/30/2015   R anterior lat neck, Atypical squamous proliferation, excised 08/28/2015    Past Surgical History:  Procedure Laterality Date   APPENDECTOMY     COLONOSCOPY N/A 12/27/2014   Procedure: COLONOSCOPY;  Surgeon: Lollie Sails, MD;  Location: [facility] ENDOSCOPY;  Service: Endoscopy;  Laterality: N/A;   LAPAROSCOPIC PARAESOPHAGEAL HERNIA REPAIR     TONSILLECTOMY      Current Medications: Current Meds  Medication Sig   albuterol (VENTOLIN HFA) 108 (90 Base) MCG/ACT inhaler INHALE 2 PUFFS 4 TIMES A DAY AS NEEDED   allopurinol (ZYLOPRIM) 100 MG tablet Take 1  tablet (100 mg total) by mouth daily.   Ascorbic Acid (VITAMIN C) 1000 MG tablet Take 1,000 mg by mouth daily. Reported on 06/26/2015   aspirin EC 81 MG tablet Take 1 tablet (81 mg total) by mouth daily. Swallow whole.   atorvastatin (LIPITOR) 20 MG tablet Take 1 tablet (20 mg total) by mouth daily.   Cholecalciferol (VITAMIN D3) 2000 units capsule Take 4,000 Units by mouth.   colchicine 0.6 MG tablet Take 1 tablet (0.6 mg total) by mouth daily.   Cyanocobalamin (VITAMIN B-12) 5000 MCG SUBL Place 2 each under the tongue daily.   cyclobenzaprine (FLEXERIL) 10 MG tablet Take 1 tablet (10 mg total) by mouth 3 (three) times daily as needed for muscle spasms.   Dapsone 5 % topical gel Aczone 5 % topical gel  APPLY A SMALL AMOUNT TO AFFECTED AREA EVERY MORNING   docusate sodium (COLACE) 100 MG capsule Take 100 mg by mouth daily.   doxycycline (VIBRAMYCIN) 50 MG capsule TAKE 1 CAPSULE BY MOUTH EVERY DAY   DUPIXENT 300 MG/2ML SOPN Inject into the skin.   finasteride (PROSCAR) 5 MG tablet TAKE 1 TABLET BY MOUTH EVERY DAY   fluticasone (FLONASE) 50 MCG/ACT nasal spray SPRAY 2 SPRAYS INTO EACH NOSTRIL EVERY DAY   ipratropium (ATROVENT) 0.03 % nasal spray Place 2 sprays into both nostrils every 12 (twelve) hours.  ketoconazole (NIZORAL) 2 % shampoo Shampoo from the waist up let sit 5 minutes then wash off. Use twice per week for 2 mths. Then QM there after.   levothyroxine (SYNTHROID) 100 MCG tablet Take 1 tablet (100 mcg total) by mouth daily before breakfast.   lisinopril (ZESTRIL) 5 MG tablet TAKE 1 TABLET BY MOUTH EVERY DAY   metroNIDAZOLE (METROGEL) 1 % gel metronidazole 1 % topical gel  APPLY TO AFFECTED AREA EVERY DAY   montelukast (SINGULAIR) 10 MG tablet TAKE 1 TABLET BY MOUTH EVERYDAY AT BEDTIME   Multiple Vitamin (MULTIVITAMIN) capsule Take 1 capsule by mouth daily.   Pyridoxine HCl 200 MG TBCR Take by mouth.   torsemide (DEMADEX) 10 MG tablet Take 1 tablet (10 mg total) by mouth daily.    TRELEGY ELLIPTA 200-62.5-25 MCG/ACT AEPB Inhale 1 puff into the lungs daily.     Allergies:   Sulfa drugs cross reactors   Social History   Socioeconomic History   Marital status: Married    Spouse name: Not on file   Number of children: Not on file   Years of education: Not on file   Highest education level: Not on file  Occupational History   Not on file  Tobacco Use   Smoking status: Former    Packs/day: 0.50    Years: 20.00    Total pack years: 10.00    Types: Cigarettes    Quit date: 06/15/1999    Years since quitting: 22.8   Smokeless tobacco: Never  Substance and Sexual Activity   Alcohol use: Yes    Alcohol/week: 1.0 standard drink of alcohol    Types: 1 Standard drinks or equivalent per week    Comment: rarely   Drug use: No   Sexual activity: Yes  Other Topics Concern   Not on file  Social History Narrative   Not on file   Social Determinants of Health   Financial Resource Strain: Low Risk  (01/26/2022)   Overall Financial Resource Strain (CARDIA)    Difficulty of Paying Living Expenses: Not hard at all  Food Insecurity: No Food Insecurity (01/26/2022)   Hunger Vital Sign    Worried About Running Out of Food in the Last Year: Never true    Ran Out of Food in the Last Year: Never true  Transportation Needs: No Transportation Needs (01/26/2022)   PRAPARE - Hydrologist (Medical): No    Lack of Transportation (Non-Medical): No  Physical Activity: Sufficiently Active (01/26/2022)   Exercise Vital Sign    Days of Exercise per Week: 7 days    Minutes of Exercise per Session: 30 min  Stress: No Stress Concern Present (01/26/2022)   [place]    Feeling of Stress : Not at all  Social Connections: Unknown (01/26/2022)   Social Connection and Isolation Panel [NHANES]    Frequency of Communication with Friends and Family: More than three times a week    Frequency of  Social Gatherings with Friends and Family: More than three times a week    Attends Religious Services: Not on Advertising copywriter or Organizations: Not on file    Attends Archivist Meetings: Not on file    Marital Status: Married     Family History: The patient's family history includes Cancer in his sister; Diabetes in his mother; Heart attack (age of onset: 62) in his father; Hyperlipidemia in his mother;  Hypertension in his father and mother.  ROS:   Please see the history of present illness.     All other systems reviewed and are negative.  EKGs/Labs/Other Studies Reviewed:    The following studies were reviewed today:   EKG:  EKG not ordered today.    Recent Labs: 02/26/2022: ALT 34; BUN 16; Creatinine, Ser 1.06; Hemoglobin 15.5; Platelets 221.0; Potassium 4.5; Pro B Natriuretic peptide (BNP) 40.0; Sodium 139 04/13/2022: TSH 2.48  Recent Lipid Panel    Component Value Date/Time   CHOL 181 12/29/2020 1028   CHOL 142 03/01/2016 0929   TRIG 178.0 (H) 12/29/2020 1028   HDL 46.20 12/29/2020 1028   HDL 49 03/01/2016 0929   CHOLHDL 4 12/29/2020 1028   VLDL 35.6 12/29/2020 1028   LDLCALC 99 12/29/2020 1028   LDLCALC 70 03/01/2016 0929     Risk Assessment/Calculations:               Physical Exam:    VS:  BP 138/80 (BP Location: Left Arm, Patient Position: Sitting, Cuff Size: Normal)   Pulse 68   Ht '5\' 11"'$  (1.803 m)   Wt 221 lb 8 oz (100.5 kg)   SpO2 97%   BMI 30.89 kg/m     Wt Readings from Last 3 Encounters:  04/23/22 221 lb 8 oz (100.5 kg)  03/01/22 244 lb 3.2 oz (110.8 kg)  02/26/22 222 lb (100.7 kg)     GEN:  Well nourished, well developed in no acute distress HEENT: Normal NECK: No JVD; No carotid bruits CARDIAC: RRR, no murmurs, rubs, gallops RESPIRATORY:  Clear to auscultation without rales, wheezing or rhonchi  ABDOMEN: Soft, non-tender, non-distended MUSCULOSKELETAL:  No edema; No deformity  SKIN: Warm and  dry NEUROLOGIC:  Alert and oriented x 3 PSYCHIATRIC:  Normal affect   ASSESSMENT:    1. Coronary artery disease without angina pectoris, unspecified vessel or lesion type, unspecified whether native or transplanted heart   2. Primary hypertension    PLAN:    In order of problems listed above:  Chest pain, CCTA mild proximal LAD, minimal distal RCA.  Calcium score 200.  EF 55 to 60%.  Start aspirin 81 mg daily, Lipitor 20 mg daily. Hypertension, BP controlled, continue lisinopril 5 mg daily.  Follow-up in 1 year.    Medication Adjustments/Labs and Tests Ordered: Current medicines are reviewed at length with the patient today.  Concerns regarding medicines are outlined above.  No orders of the defined types were placed in this encounter.  No orders of the defined types were placed in this encounter.   Patient Instructions  Medication Instructions:   Your physician recommends that you continue on your current medications as directed. Please refer to the Current Medication list given to you today.  *If you need a refill on your cardiac medications before your next appointment, please call your pharmacy*   Lab Work:  None Ordered  If you have labs (blood work) drawn today and your tests are completely normal, you will receive your results only by: [place] (if you have MyChart) OR A paper copy in the mail If you have any lab test that is abnormal or we need to change your treatment, we will call you to review the results.   Testing/Procedures:  None Ordered   Follow-Up: At [facility], you and your health needs are our priority.  As part of our continuing mission to provide you with exceptional heart care, we have created designated Provider Care  Teams.  These Care Teams include your primary Cardiologist (physician) and Advanced Practice Providers (APPs -  Physician Assistants and Nurse Practitioners) who all work together to provide you with the care you  need, when you need it.  We recommend signing up for the patient portal called "MyChart".  Sign up information is provided on this After Visit Summary.  MyChart is used to connect with patients for Virtual Visits (Telemedicine).  Patients are able to view lab/test results, encounter notes, upcoming appointments, etc.  Non-urgent messages can be sent to your provider as well.   To learn more about what you can do with MyChart, go to [email or web address].    Your next appointment:   12 month(s)  The format for your next appointment:   In Person  Provider:   You may see Kate Sable, MD or one of the following Advanced Practice Providers on your designated Care Team:   Murray Hodgkins, NP Christell Faith, PA-C Cadence Kathlen Mody, PA-C Gerrie Nordmann, NP    Signed, Kate Sable, MD  04/23/2022 4:12 PM    [place]

## 2022-04-23 NOTE — Patient Instructions (Signed)
Medication Instructions:   Your physician recommends that you continue on your current medications as directed. Please refer to the Current Medication list given to you today.  *If you need a refill on your cardiac medications before your next appointment, please call your pharmacy*   Lab Work:  None Ordered  If you have labs (blood work) drawn today and your tests are completely normal, you will receive your results only by: [place] (if you have MyChart) OR A paper copy in the mail If you have any lab test that is abnormal or we need to change your treatment, we will call you to review the results.   Testing/Procedures:  None Ordered   Follow-Up: At [facility], you and your health needs are our priority.  As part of our continuing mission to provide you with exceptional heart care, we have created designated Provider Care Teams.  These Care Teams include your primary Cardiologist (physician) and Advanced Practice Providers (APPs -  Physician Assistants and Nurse Practitioners) who all work together to provide you with the care you need, when you need it.  We recommend signing up for the patient portal called "MyChart".  Sign up information is provided on this After Visit Summary.  MyChart is used to connect with patients for Virtual Visits (Telemedicine).  Patients are able to view lab/test results, encounter notes, upcoming appointments, etc.  Non-urgent messages can be sent to your provider as well.   To learn more about what you can do with MyChart, go to [email or web address].    Your next appointment:   12 month(s)  The format for your next appointment:   In Person  Provider:   You may see Kate Sable, MD or one of the following Advanced Practice Providers on your designated Care Team:   Murray Hodgkins, NP Christell Faith, PA-C Cadence Kathlen Mody, PA-C Gerrie Nordmann, NP

## 2022-04-26 ENCOUNTER — Ambulatory Visit: Payer: Medicare HMO | Admitting: Cardiovascular Disease

## 2022-04-29 ENCOUNTER — Encounter: Payer: Self-pay | Admitting: Podiatry

## 2022-04-29 ENCOUNTER — Ambulatory Visit: Payer: Medicare HMO | Admitting: Podiatry

## 2022-04-29 VITALS — BP 159/92 | HR 92

## 2022-04-29 DIAGNOSIS — M79674 Pain in right toe(s): Secondary | ICD-10-CM

## 2022-04-29 DIAGNOSIS — M79675 Pain in left toe(s): Secondary | ICD-10-CM

## 2022-04-29 DIAGNOSIS — B351 Tinea unguium: Secondary | ICD-10-CM

## 2022-04-29 DIAGNOSIS — I872 Venous insufficiency (chronic) (peripheral): Secondary | ICD-10-CM | POA: Diagnosis not present

## 2022-04-29 NOTE — Progress Notes (Signed)
This patient presents to the office with chief complaint of long thick painful nails.  Patient says the nails are painful walking and wearing shoes.  This patient is unable to self treat.  This patient is unable to trim his nails since he is unable to reach his nails.  he presents to the office for preventative foot care services.  General Appearance  Alert, conversant and in no acute stress.  Vascular  Dorsalis pedis and posterior tibial  pulses are palpable  bilaterally.  Capillary return is within normal limits  bilaterally. Temperature is within normal limits  bilaterally.  Neurologic  Senn-Weinstein monofilament wire test within normal limits  bilaterally. Muscle power within normal limits bilaterally.  Nails Thick disfigured discolored nails with subungual debris  from hallux to fifth toes bilaterally. No evidence of bacterial infection or drainage bilaterally.  Orthopedic  No limitations of motion  feet .  No crepitus or effusions noted.  No bony pathology or digital deformities noted.  Skin  normotropic skin with no porokeratosis noted bilaterally.  No signs of infections or ulcers noted.     Onychomycosis  Nails  B/L.  Pain in right toes  Pain in left toes  Debridement of nails both feet followed trimming the nails with dremel tool.    RTC  12  weeks    Gardiner Barefoot DPM

## 2022-04-30 ENCOUNTER — Encounter: Payer: Self-pay | Admitting: Family Medicine

## 2022-04-30 DIAGNOSIS — I10 Essential (primary) hypertension: Secondary | ICD-10-CM

## 2022-05-03 DIAGNOSIS — M1611 Unilateral primary osteoarthritis, right hip: Secondary | ICD-10-CM | POA: Diagnosis not present

## 2022-05-03 DIAGNOSIS — M25551 Pain in right hip: Secondary | ICD-10-CM | POA: Diagnosis not present

## 2022-05-03 MED ORDER — LISINOPRIL 10 MG PO TABS: 10.0000 mg | ORAL_TABLET | Freq: Every day | ORAL | 3 refills | Status: DC

## 2022-05-03 NOTE — Telephone Encounter (Signed)
Patient needs labs in 7-10 days. Please contact him to get this scheduled. Thanks.

## 2022-05-07 ENCOUNTER — Other Ambulatory Visit: Payer: Medicare HMO

## 2022-05-13 ENCOUNTER — Other Ambulatory Visit (INDEPENDENT_AMBULATORY_CARE_PROVIDER_SITE_OTHER): Payer: Medicare HMO

## 2022-05-13 DIAGNOSIS — I10 Essential (primary) hypertension: Secondary | ICD-10-CM

## 2022-05-13 LAB — BASIC METABOLIC PANEL
BUN: 23 mg/dL (ref 6–23)
CO2: 29 mEq/L (ref 19–32)
Calcium: 9.6 mg/dL (ref 8.4–10.5)
Chloride: 104 mEq/L (ref 96–112)
Creatinine, Ser: 1.1 mg/dL (ref 0.40–1.50)
GFR: 65.55 mL/min (ref >60.00)
Glucose, Bld: 92 mg/dL (ref 70–99)
Potassium: 4.6 mEq/L (ref 3.5–5.1)
Sodium: 140 mEq/L (ref 135–145)

## 2022-05-18 ENCOUNTER — Encounter: Payer: Self-pay | Admitting: Family Medicine

## 2022-05-18 DIAGNOSIS — M9905 Segmental and somatic dysfunction of pelvic region: Secondary | ICD-10-CM | POA: Diagnosis not present

## 2022-05-18 DIAGNOSIS — M5417 Radiculopathy, lumbosacral region: Secondary | ICD-10-CM | POA: Diagnosis not present

## 2022-05-18 DIAGNOSIS — M9903 Segmental and somatic dysfunction of lumbar region: Secondary | ICD-10-CM | POA: Diagnosis not present

## 2022-05-18 DIAGNOSIS — M4306 Spondylolysis, lumbar region: Secondary | ICD-10-CM | POA: Diagnosis not present

## 2022-05-26 ENCOUNTER — Encounter: Payer: Self-pay | Admitting: Family Medicine

## 2022-05-26 ENCOUNTER — Ambulatory Visit (INDEPENDENT_AMBULATORY_CARE_PROVIDER_SITE_OTHER): Payer: Medicare HMO | Admitting: Family Medicine

## 2022-05-26 VITALS — BP 110/78 | HR 87 | Temp 98.0°F | Ht 71.0 in | Wt 213.0 lb

## 2022-05-26 DIAGNOSIS — I10 Essential (primary) hypertension: Secondary | ICD-10-CM

## 2022-05-26 DIAGNOSIS — E890 Postprocedural hypothyroidism: Secondary | ICD-10-CM | POA: Diagnosis not present

## 2022-05-26 DIAGNOSIS — J4530 Mild persistent asthma, uncomplicated: Secondary | ICD-10-CM | POA: Diagnosis not present

## 2022-05-26 DIAGNOSIS — M1A0710 Idiopathic chronic gout, right ankle and foot, without tophus (tophi): Secondary | ICD-10-CM | POA: Diagnosis not present

## 2022-05-26 DIAGNOSIS — K449 Diaphragmatic hernia without obstruction or gangrene: Secondary | ICD-10-CM | POA: Insufficient documentation

## 2022-05-26 DIAGNOSIS — J309 Allergic rhinitis, unspecified: Secondary | ICD-10-CM | POA: Diagnosis not present

## 2022-05-26 LAB — CBC WITH DIFFERENTIAL/PLATELET
Basophils Absolute: 0 10*3/uL (ref 0.0–0.1)
Basophils Relative: 0.6 % (ref 0.0–3.0)
Eosinophils Absolute: 0.2 10*3/uL (ref 0.0–0.7)
Eosinophils Relative: 2.5 % (ref 0.0–5.0)
HCT: 48.1 % (ref 39.0–52.0)
Hemoglobin: 16.3 g/dL (ref 13.0–17.0)
Lymphocytes Relative: 21.2 % (ref 12.0–46.0)
Lymphs Abs: 1.7 10*3/uL (ref 0.7–4.0)
MCHC: 34 g/dL (ref 30.0–36.0)
MCV: 87.5 fl (ref 78.0–100.0)
Monocytes Absolute: 0.5 10*3/uL (ref 0.1–1.0)
Monocytes Relative: 5.9 % (ref 3.0–12.0)
Neutro Abs: 5.5 10*3/uL (ref 1.4–7.7)
Neutrophils Relative %: 69.8 % (ref 43.0–77.0)
Platelets: 215 10*3/uL (ref 150.0–400.0)
RBC: 5.49 Mil/uL (ref 4.22–5.81)
RDW: 14.4 % (ref 11.5–15.5)
WBC: 7.9 10*3/uL (ref 4.0–10.5)

## 2022-05-26 LAB — TSH: TSH: 0.81 u[IU]/mL (ref 0.35–5.50)

## 2022-05-26 LAB — HEPATIC FUNCTION PANEL
ALT: 53 U/L (ref 0–53)
AST: 34 U/L (ref 0–37)
Albumin: 4.3 g/dL (ref 3.5–5.2)
Alkaline Phosphatase: 63 U/L (ref 39–117)
Bilirubin, Direct: 0.2 mg/dL (ref 0.0–0.3)
Total Bilirubin: 1 mg/dL (ref 0.2–1.2)
Total Protein: 6.5 g/dL (ref 6.0–8.3)

## 2022-05-26 LAB — URIC ACID: Uric Acid, Serum: 7 mg/dL (ref 4.0–7.8)

## 2022-05-26 NOTE — Assessment & Plan Note (Signed)
Chronic issue.  Continue Synthroid 100 mcg daily.  Check TSH.

## 2022-05-26 NOTE — Assessment & Plan Note (Signed)
Chronic issue.  Adequately controlled.  He will continue lisinopril 10 mg daily and torsemide 10 mg daily.

## 2022-05-26 NOTE — Assessment & Plan Note (Signed)
Chronic issue.  Patient will continue his nasal spray through his ENT physician.  We will see how he does holding the Singulair.  If his allergies or asthma acts up with coming off the Singulair he can resume the Singulair 10 mg daily.

## 2022-05-26 NOTE — Assessment & Plan Note (Signed)
Chronic issue.  He will continue his Trelegy inhaler.  Dupixent is managed by pulmonology.

## 2022-05-26 NOTE — Assessment & Plan Note (Signed)
Chronic issue.  Continue allopurinol 100 mg daily.  Check lab work today.

## 2022-05-26 NOTE — Progress Notes (Signed)
[name], MD Phone: [phone]  Alexander Fitzgerald is a 77 y.o. male who presents today for f/u.  HYPERTENSION Disease Monitoring Home BP Monitoring 120ish/80 Chest pain- no    Dyspnea- no Medications Compliance-  taking lisinopril, torsemide.   Edema- no BMET    Component Value Date/Time   NA 140 05/13/2022 1022   K 4.6 05/13/2022 1022   CL 104 05/13/2022 1022   CO2 29 05/13/2022 1022   GLUCOSE 92 05/13/2022 1022   BUN 23 05/13/2022 1022   CREATININE 1.10 05/13/2022 1022   CALCIUM 9.6 05/13/2022 1022   Asthma: Patient has been on Trelegy as well as Dupixent.  He notes that [place] has helped significantly.  He notes no shortness of breath or cough.  He is only required the rescue inhaler 2 times since going on Dupixent.  Hypothyroidism: Continues on Synthroid.  Allergies: He is unsure if the Singulair actually helps with this.  He does use a nose spray from ENT.  Gout: Taking allopurinol.  No gout issues since going on allopurinol.  Social History   Tobacco Use  Smoking Status Former   Packs/day: 0.50   Years: 20.00   Total pack years: 10.00   Types: Cigarettes   Quit date: 06/15/1999   Years since quitting: 22.9  Smokeless Tobacco Never    Current Outpatient Medications on File Prior to Visit  Medication Sig Dispense Refill   albuterol (VENTOLIN HFA) 108 (90 Base) MCG/ACT inhaler INHALE 2 PUFFS 4 TIMES A DAY AS NEEDED 8.5 each 2   allopurinol (ZYLOPRIM) 100 MG tablet Take 1 tablet (100 mg total) by mouth daily. 30 tablet 6   Ascorbic Acid (VITAMIN C) 1000 MG tablet Take 1,000 mg by mouth daily. Reported on 06/26/2015     aspirin EC 81 MG tablet Take 1 tablet (81 mg total) by mouth daily. Swallow whole. 90 tablet 3   atorvastatin (LIPITOR) 20 MG tablet Take 1 tablet (20 mg total) by mouth daily. 30 tablet 3   Cholecalciferol (VITAMIN D3) 2000 units capsule Take 4,000 Units by mouth.     colchicine 0.6 MG tablet Take 1 tablet (0.6 mg total) by mouth daily.  90 tablet 0   CORLANOR 5 MG TABS tablet Take by mouth.     Cyanocobalamin (VITAMIN B-12) 5000 MCG SUBL Place 2 each under the tongue daily.     cyclobenzaprine (FLEXERIL) 10 MG tablet Take 1 tablet (10 mg total) by mouth 3 (three) times daily as needed for muscle spasms. 30 tablet 0   Dapsone 5 % topical gel Aczone 5 % topical gel  APPLY A SMALL AMOUNT TO AFFECTED AREA EVERY MORNING     dexlansoprazole (DEXILANT) 60 MG capsule SMARTSIG:1 Pill By Mouth Daily     docusate sodium (COLACE) 100 MG capsule Take 100 mg by mouth daily.     doxycycline (VIBRAMYCIN) 50 MG capsule TAKE 1 CAPSULE BY MOUTH EVERY DAY 90 capsule 2   DUPIXENT 300 MG/2ML SOPN Inject into the skin.     finasteride (PROSCAR) 5 MG tablet TAKE 1 TABLET BY MOUTH EVERY DAY 90 tablet 1   ketoconazole (NIZORAL) 2 % shampoo Shampoo from the waist up let sit 5 minutes then wash off. Use twice per week for 2 mths. Then QM there after. 120 mL 11   levothyroxine (SYNTHROID) 100 MCG tablet Take 1 tablet (100 mcg total) by mouth daily before breakfast. 90 tablet 1   lisinopril (ZESTRIL) 10 MG tablet Take 1 tablet (10 mg total) by  mouth daily. 90 tablet 3   metroNIDAZOLE (METROGEL) 1 % gel metronidazole 1 % topical gel  APPLY TO AFFECTED AREA EVERY DAY     montelukast (SINGULAIR) 10 MG tablet TAKE 1 TABLET BY MOUTH EVERYDAY AT BEDTIME 90 tablet 1   Multiple Vitamin (MULTIVITAMIN) capsule Take 1 capsule by mouth daily.     Pyridoxine HCl 200 MG TBCR Take by mouth.     RYALTRIS 665-25 MCG/ACT SUSP SMARTSIG:1 Spray(s) Both Nares Every 12 Hours PRN     torsemide (DEMADEX) 10 MG tablet Take 1 tablet (10 mg total) by mouth daily. 90 tablet 1   TRELEGY ELLIPTA 200-62.5-25 MCG/ACT AEPB Inhale 1 puff into the lungs daily.     No current facility-administered medications on file prior to visit.     ROS see history of present illness  Objective  Physical Exam Vitals:   05/26/22 1042  BP: 110/78  Pulse: 87  Temp: 98 F (36.7 C)  SpO2: 96%     BP Readings from Last 3 Encounters:  05/26/22 110/78  04/29/22 (!) 159/92  04/23/22 138/80   Wt Readings from Last 3 Encounters:  05/26/22 213 lb (96.6 kg)  04/23/22 221 lb 8 oz (100.5 kg)  03/01/22 244 lb 3.2 oz (110.8 kg)    Physical Exam Constitutional:      General: He is not in acute distress.    Appearance: He is not diaphoretic.  Cardiovascular:     Rate and Rhythm: Normal rate and regular rhythm.     Heart sounds: Normal heart sounds.  Pulmonary:     Effort: Pulmonary effort is normal.     Breath sounds: Normal breath sounds.  Skin:    General: Skin is warm and dry.  Neurological:     Mental Status: He is alert.      Assessment/Plan: Please see individual problem list.  Primary hypertension Assessment & Plan: Chronic issue.  Adequately controlled.  He will continue lisinopril 10 mg daily and torsemide 10 mg daily.   Mild persistent asthma without complication Assessment & Plan: Chronic issue.  He will continue his Trelegy inhaler.  Dupixent is managed by pulmonology.   Allergic rhinitis, unspecified seasonality, unspecified trigger Assessment & Plan: Chronic issue.  Patient will continue his nasal spray through his ENT physician.  We will see how he does holding the Singulair.  If his allergies or asthma acts up with coming off the Singulair he can resume the Singulair 10 mg daily.   Postsurgical hypothyroidism Assessment & Plan: Chronic issue.  Continue Synthroid 100 mcg daily.  Check TSH.  Orders: -     TSH  Idiopathic chronic gout of right foot without tophus Assessment & Plan: Chronic issue.  Continue allopurinol 100 mg daily.  Check lab work today.  Orders: -     Uric acid -     CBC with Differential/Platelet -     Hepatic function panel    Return in about 6 months (around 11/24/2022) for Hypertension.   [name], MD [place]

## 2022-05-26 NOTE — Patient Instructions (Signed)
Nice to see you. We will get lab work today and contact you with the results. Please try stopping your Singulair and see if you notice any difference.  If your allergies or asthma worsens with stopping this you can always go back on it.

## 2022-05-27 ENCOUNTER — Other Ambulatory Visit: Payer: Self-pay

## 2022-05-27 DIAGNOSIS — I10 Essential (primary) hypertension: Secondary | ICD-10-CM

## 2022-05-27 DIAGNOSIS — M1A9XX0 Chronic gout, unspecified, without tophus (tophi): Secondary | ICD-10-CM

## 2022-06-15 DIAGNOSIS — M9903 Segmental and somatic dysfunction of lumbar region: Secondary | ICD-10-CM | POA: Diagnosis not present

## 2022-06-15 DIAGNOSIS — M7711 Lateral epicondylitis, right elbow: Secondary | ICD-10-CM | POA: Diagnosis not present

## 2022-06-15 DIAGNOSIS — M5417 Radiculopathy, lumbosacral region: Secondary | ICD-10-CM | POA: Diagnosis not present

## 2022-06-15 DIAGNOSIS — M79631 Pain in right forearm: Secondary | ICD-10-CM | POA: Diagnosis not present

## 2022-06-15 DIAGNOSIS — M25521 Pain in right elbow: Secondary | ICD-10-CM | POA: Diagnosis not present

## 2022-06-15 DIAGNOSIS — M4306 Spondylolysis, lumbar region: Secondary | ICD-10-CM | POA: Diagnosis not present

## 2022-06-15 DIAGNOSIS — M9905 Segmental and somatic dysfunction of pelvic region: Secondary | ICD-10-CM | POA: Diagnosis not present

## 2022-06-17 ENCOUNTER — Other Ambulatory Visit: Payer: Self-pay | Admitting: Family Medicine

## 2022-06-24 ENCOUNTER — Other Ambulatory Visit (INDEPENDENT_AMBULATORY_CARE_PROVIDER_SITE_OTHER): Payer: Medicare HMO

## 2022-06-24 DIAGNOSIS — M1A9XX0 Chronic gout, unspecified, without tophus (tophi): Secondary | ICD-10-CM | POA: Diagnosis not present

## 2022-06-24 DIAGNOSIS — I10 Essential (primary) hypertension: Secondary | ICD-10-CM

## 2022-06-25 LAB — CBC WITH DIFFERENTIAL/PLATELET
Basophils Absolute: 0.1 10*3/uL (ref 0.0–0.1)
Basophils Relative: 1.3 % (ref 0.0–3.0)
Eosinophils Absolute: 0.2 10*3/uL (ref 0.0–0.7)
Eosinophils Relative: 2.7 % (ref 0.0–5.0)
HCT: 45.6 % (ref 39.0–52.0)
Hemoglobin: 15.8 g/dL (ref 13.0–17.0)
Lymphocytes Relative: 27.8 % (ref 12.0–46.0)
Lymphs Abs: 2.2 10*3/uL (ref 0.7–4.0)
MCHC: 34.7 g/dL (ref 30.0–36.0)
MCV: 86.6 fl (ref 78.0–100.0)
Monocytes Absolute: 0.5 10*3/uL (ref 0.1–1.0)
Monocytes Relative: 6.6 % (ref 3.0–12.0)
Neutro Abs: 4.9 10*3/uL (ref 1.4–7.7)
Neutrophils Relative %: 61.6 % (ref 43.0–77.0)
Platelets: 251 10*3/uL (ref 150.0–400.0)
RBC: 5.26 Mil/uL (ref 4.22–5.81)
RDW: 15 % (ref 11.5–15.5)
WBC: 8 10*3/uL (ref 4.0–10.5)

## 2022-06-25 LAB — COMPREHENSIVE METABOLIC PANEL
ALT: 59 U/L — ABNORMAL HIGH (ref 0–53)
AST: 48 U/L — ABNORMAL HIGH (ref 0–37)
Albumin: 3.9 g/dL (ref 3.5–5.2)
Alkaline Phosphatase: 71 U/L (ref 39–117)
BUN: 20 mg/dL (ref 6–23)
CO2: 22 mEq/L (ref 19–32)
Calcium: 9.5 mg/dL (ref 8.4–10.5)
Chloride: 106 mEq/L (ref 96–112)
Creatinine, Ser: 1.31 mg/dL (ref 0.40–1.50)
GFR: 53.11 mL/min — ABNORMAL LOW (ref >60.00)
Glucose, Bld: 136 mg/dL — ABNORMAL HIGH (ref 70–99)
Potassium: 3.3 mEq/L — ABNORMAL LOW (ref 3.5–5.1)
Sodium: 140 mEq/L (ref 135–145)
Total Bilirubin: 0.8 mg/dL (ref 0.2–1.2)
Total Protein: 6.1 g/dL (ref 6.0–8.3)

## 2022-06-25 LAB — URIC ACID: Uric Acid, Serum: 6.8 mg/dL (ref 4.0–7.8)

## 2022-06-26 ENCOUNTER — Encounter: Payer: Self-pay | Admitting: Family Medicine

## 2022-06-26 ENCOUNTER — Encounter: Payer: Self-pay | Admitting: Cardiology

## 2022-06-28 ENCOUNTER — Encounter: Payer: Self-pay | Admitting: Family Medicine

## 2022-06-28 NOTE — Telephone Encounter (Signed)
See other mychart message response.

## 2022-07-08 DIAGNOSIS — G4733 Obstructive sleep apnea (adult) (pediatric): Secondary | ICD-10-CM | POA: Diagnosis not present

## 2022-07-12 ENCOUNTER — Other Ambulatory Visit: Payer: Self-pay | Admitting: Family Medicine

## 2022-07-12 ENCOUNTER — Other Ambulatory Visit (INDEPENDENT_AMBULATORY_CARE_PROVIDER_SITE_OTHER): Payer: Medicare HMO

## 2022-07-12 DIAGNOSIS — M1A9XX0 Chronic gout, unspecified, without tophus (tophi): Secondary | ICD-10-CM

## 2022-07-12 LAB — COMPREHENSIVE METABOLIC PANEL
ALT: 69 U/L — ABNORMAL HIGH (ref 0–53)
AST: 42 U/L — ABNORMAL HIGH (ref 0–37)
Albumin: 4.4 g/dL (ref 3.5–5.2)
Alkaline Phosphatase: 74 U/L (ref 39–117)
BUN: 20 mg/dL (ref 6–23)
CO2: 25 mEq/L (ref 19–32)
Calcium: 9.5 mg/dL (ref 8.4–10.5)
Chloride: 102 mEq/L (ref 96–112)
Creatinine, Ser: 1.1 mg/dL (ref 0.40–1.50)
GFR: 65.48 mL/min (ref >60.00)
Glucose, Bld: 86 mg/dL (ref 70–99)
Potassium: 4.2 mEq/L (ref 3.5–5.1)
Sodium: 137 mEq/L (ref 135–145)
Total Bilirubin: 1.2 mg/dL (ref 0.2–1.2)
Total Protein: 6.7 g/dL (ref 6.0–8.3)

## 2022-07-12 LAB — URIC ACID: Uric Acid, Serum: 8 mg/dL — ABNORMAL HIGH (ref 4.0–7.8)

## 2022-07-13 DIAGNOSIS — M5417 Radiculopathy, lumbosacral region: Secondary | ICD-10-CM | POA: Diagnosis not present

## 2022-07-13 DIAGNOSIS — M9905 Segmental and somatic dysfunction of pelvic region: Secondary | ICD-10-CM | POA: Diagnosis not present

## 2022-07-13 DIAGNOSIS — M9903 Segmental and somatic dysfunction of lumbar region: Secondary | ICD-10-CM | POA: Diagnosis not present

## 2022-07-13 DIAGNOSIS — M4306 Spondylolysis, lumbar region: Secondary | ICD-10-CM | POA: Diagnosis not present

## 2022-07-14 ENCOUNTER — Other Ambulatory Visit: Payer: Self-pay | Admitting: Family Medicine

## 2022-07-14 DIAGNOSIS — R7989 Other specified abnormal findings of blood chemistry: Secondary | ICD-10-CM

## 2022-07-16 ENCOUNTER — Other Ambulatory Visit: Payer: Self-pay

## 2022-07-16 MED ORDER — ATORVASTATIN CALCIUM 20 MG PO TABS: 20.0000 mg | ORAL_TABLET | Freq: Every day | ORAL | 8 refills | Status: DC

## 2022-07-20 ENCOUNTER — Ambulatory Visit
Admission: RE | Admit: 2022-07-20 | Discharge: 2022-07-20 | Disposition: A | Discharge status: Home / Self Care | Payer: Medicare HMO | Source: Ambulatory Visit | Attending: Family Medicine | Admitting: Family Medicine

## 2022-07-20 DIAGNOSIS — R7989 Other specified abnormal findings of blood chemistry: Secondary | ICD-10-CM | POA: Diagnosis not present

## 2022-07-20 DIAGNOSIS — R945 Abnormal results of liver function studies: Secondary | ICD-10-CM | POA: Diagnosis not present

## 2022-07-22 ENCOUNTER — Other Ambulatory Visit: Payer: Self-pay | Admitting: Family Medicine

## 2022-07-22 DIAGNOSIS — R7989 Other specified abnormal findings of blood chemistry: Secondary | ICD-10-CM

## 2022-07-23 ENCOUNTER — Telehealth: Payer: Self-pay

## 2022-07-23 ENCOUNTER — Other Ambulatory Visit (INDEPENDENT_AMBULATORY_CARE_PROVIDER_SITE_OTHER): Payer: Medicare HMO

## 2022-07-23 DIAGNOSIS — R7989 Other specified abnormal findings of blood chemistry: Secondary | ICD-10-CM | POA: Diagnosis not present

## 2022-07-23 NOTE — Addendum Note (Signed)
Addended by: Clearnce Sorrel on: 07/23/2022 02:09 PM   Modules accepted: Orders

## 2022-07-23 NOTE — Telephone Encounter (Signed)
Patient states Dr. Marikay Alar would like for him to schedule a lab visit to recheck his hepatic function panel and uric acid.  Patient states he would like to come in today for his lab visit, so I scheduled an appointment for him at 2:15pm today..  We have a lab order for hepatic function panel, but not for uric acid.

## 2022-07-24 LAB — HEPATIC FUNCTION PANEL
AG Ratio: 2 (calc) (ref 1.0–2.5)
ALT: 68 U/L — ABNORMAL HIGH (ref 9–46)
AST: 42 U/L — ABNORMAL HIGH (ref 10–35)
Albumin: 4.4 g/dL (ref 3.6–5.1)
Alkaline phosphatase (APISO): 74 U/L (ref 35–144)
Bilirubin, Direct: 0.1 mg/dL (ref 0.0–0.2)
Globulin: 2.2 g/dL (calc) (ref 1.9–3.7)
Indirect Bilirubin: 0.7 mg/dL (calc) (ref 0.2–1.2)
Total Bilirubin: 0.8 mg/dL (ref 0.2–1.2)
Total Protein: 6.6 g/dL (ref 6.1–8.1)

## 2022-07-26 ENCOUNTER — Other Ambulatory Visit: Payer: Medicare HMO

## 2022-07-26 ENCOUNTER — Other Ambulatory Visit: Payer: Self-pay | Admitting: Family Medicine

## 2022-07-26 DIAGNOSIS — R7989 Other specified abnormal findings of blood chemistry: Secondary | ICD-10-CM

## 2022-07-26 DIAGNOSIS — K760 Fatty (change of) liver, not elsewhere classified: Secondary | ICD-10-CM

## 2022-07-29 ENCOUNTER — Ambulatory Visit: Payer: Medicare HMO | Admitting: Podiatry

## 2022-07-29 ENCOUNTER — Encounter: Payer: Self-pay | Admitting: Podiatry

## 2022-07-29 VITALS — BP 129/80 | HR 85

## 2022-07-29 DIAGNOSIS — B351 Tinea unguium: Secondary | ICD-10-CM | POA: Diagnosis not present

## 2022-07-29 DIAGNOSIS — M79675 Pain in left toe(s): Secondary | ICD-10-CM | POA: Diagnosis not present

## 2022-07-29 DIAGNOSIS — M79674 Pain in right toe(s): Secondary | ICD-10-CM | POA: Diagnosis not present

## 2022-07-29 DIAGNOSIS — I872 Venous insufficiency (chronic) (peripheral): Secondary | ICD-10-CM | POA: Diagnosis not present

## 2022-07-29 NOTE — Progress Notes (Signed)
This patient presents to the office with chief complaint of long thick painful nails.  Patient says the nails are painful walking and wearing shoes.  This patient is unable to self treat.  This patient is unable to trim his nails since he is unable to reach his nails.  he presents to the office for preventative foot care services.  General Appearance  Alert, conversant and in no acute stress.  Vascular  Dorsalis pedis and posterior tibial  pulses are palpable  bilaterally.  Capillary return is within normal limits  bilaterally. Temperature is within normal limits  bilaterally.  Neurologic  Senn-Weinstein monofilament wire test within normal limits  bilaterally. Muscle power within normal limits bilaterally.  Nails Thick disfigured discolored nails with subungual debris  from hallux to fifth toes bilaterally. No evidence of bacterial infection or drainage bilaterally.  Orthopedic  No limitations of motion  feet .  No crepitus or effusions noted.  No bony pathology or digital deformities noted.  Skin  normotropic skin with no porokeratosis noted bilaterally.  No signs of infections or ulcers noted.     Onychomycosis  Nails  B/L.  Pain in right toes  Pain in left toes  Debridement of nails both feet followed trimming the nails with dremel tool.    RTC  12  weeks    Naviah Belfield DPM   

## 2022-08-02 DIAGNOSIS — M4306 Spondylolysis, lumbar region: Secondary | ICD-10-CM | POA: Diagnosis not present

## 2022-08-02 DIAGNOSIS — M9903 Segmental and somatic dysfunction of lumbar region: Secondary | ICD-10-CM | POA: Diagnosis not present

## 2022-08-02 DIAGNOSIS — M5417 Radiculopathy, lumbosacral region: Secondary | ICD-10-CM | POA: Diagnosis not present

## 2022-08-02 DIAGNOSIS — M9905 Segmental and somatic dysfunction of pelvic region: Secondary | ICD-10-CM | POA: Diagnosis not present

## 2022-08-06 DIAGNOSIS — R490 Dysphonia: Secondary | ICD-10-CM | POA: Diagnosis not present

## 2022-08-06 DIAGNOSIS — K219 Gastro-esophageal reflux disease without esophagitis: Secondary | ICD-10-CM | POA: Diagnosis not present

## 2022-08-06 DIAGNOSIS — G4733 Obstructive sleep apnea (adult) (pediatric): Secondary | ICD-10-CM | POA: Diagnosis not present

## 2022-08-10 ENCOUNTER — Other Ambulatory Visit: Payer: Self-pay | Admitting: Family Medicine

## 2022-08-10 DIAGNOSIS — M1A9XX0 Chronic gout, unspecified, without tophus (tophi): Secondary | ICD-10-CM

## 2022-08-11 ENCOUNTER — Encounter: Payer: Self-pay | Admitting: Family Medicine

## 2022-08-11 DIAGNOSIS — M1A9XX0 Chronic gout, unspecified, without tophus (tophi): Secondary | ICD-10-CM

## 2022-08-13 MED ORDER — COLCHICINE 0.6 MG PO TABS: 0.6000 mg | ORAL_TABLET | Freq: Every day | ORAL | 0 refills | Status: DC

## 2022-08-18 DIAGNOSIS — J4530 Mild persistent asthma, uncomplicated: Secondary | ICD-10-CM | POA: Diagnosis not present

## 2022-08-18 DIAGNOSIS — J8283 Eosinophilic asthma: Secondary | ICD-10-CM | POA: Diagnosis not present

## 2022-08-18 DIAGNOSIS — Z1159 Encounter for screening for other viral diseases: Secondary | ICD-10-CM | POA: Diagnosis not present

## 2022-08-23 DIAGNOSIS — M5417 Radiculopathy, lumbosacral region: Secondary | ICD-10-CM | POA: Diagnosis not present

## 2022-08-23 DIAGNOSIS — M4306 Spondylolysis, lumbar region: Secondary | ICD-10-CM | POA: Diagnosis not present

## 2022-08-23 DIAGNOSIS — M9903 Segmental and somatic dysfunction of lumbar region: Secondary | ICD-10-CM | POA: Diagnosis not present

## 2022-08-23 DIAGNOSIS — M9905 Segmental and somatic dysfunction of pelvic region: Secondary | ICD-10-CM | POA: Diagnosis not present

## 2022-08-30 ENCOUNTER — Encounter: Payer: Self-pay | Admitting: Family Medicine

## 2022-08-30 DIAGNOSIS — M1A0710 Idiopathic chronic gout, right ankle and foot, without tophus (tophi): Secondary | ICD-10-CM

## 2022-08-31 NOTE — Telephone Encounter (Signed)
See other mychart message.

## 2022-09-01 MED ORDER — FEBUXOSTAT 40 MG PO TABS
40.0000 mg | ORAL_TABLET | Freq: Every day | ORAL | 1 refills | Status: DC
Start: 2022-09-01 — End: 2022-09-30

## 2022-09-01 NOTE — Addendum Note (Signed)
Addended by: Glori Luis on: 09/01/2022 04:17 PM   Modules accepted: Orders

## 2022-09-01 NOTE — Telephone Encounter (Signed)
Patient needs labs in 4 weeks.  Orders placed.  Please get him scheduled.  Thanks.

## 2022-09-06 DIAGNOSIS — M25551 Pain in right hip: Secondary | ICD-10-CM | POA: Diagnosis not present

## 2022-09-06 DIAGNOSIS — M1611 Unilateral primary osteoarthritis, right hip: Secondary | ICD-10-CM | POA: Diagnosis not present

## 2022-09-07 ENCOUNTER — Other Ambulatory Visit: Payer: Self-pay | Admitting: Family Medicine

## 2022-09-07 DIAGNOSIS — I10 Essential (primary) hypertension: Secondary | ICD-10-CM

## 2022-09-09 ENCOUNTER — Ambulatory Visit: Payer: Medicare HMO | Admitting: Dermatology

## 2022-09-25 ENCOUNTER — Other Ambulatory Visit: Payer: Self-pay | Admitting: Family Medicine

## 2022-09-25 DIAGNOSIS — M1A0710 Idiopathic chronic gout, right ankle and foot, without tophus (tophi): Secondary | ICD-10-CM

## 2022-09-26 ENCOUNTER — Other Ambulatory Visit: Payer: Self-pay | Admitting: Family Medicine

## 2022-09-28 ENCOUNTER — Other Ambulatory Visit (INDEPENDENT_AMBULATORY_CARE_PROVIDER_SITE_OTHER): Payer: Medicare HMO

## 2022-09-28 DIAGNOSIS — M5417 Radiculopathy, lumbosacral region: Secondary | ICD-10-CM | POA: Diagnosis not present

## 2022-09-28 DIAGNOSIS — I10 Essential (primary) hypertension: Secondary | ICD-10-CM | POA: Diagnosis not present

## 2022-09-28 DIAGNOSIS — M9905 Segmental and somatic dysfunction of pelvic region: Secondary | ICD-10-CM | POA: Diagnosis not present

## 2022-09-28 DIAGNOSIS — M9903 Segmental and somatic dysfunction of lumbar region: Secondary | ICD-10-CM | POA: Diagnosis not present

## 2022-09-28 DIAGNOSIS — M1A0710 Idiopathic chronic gout, right ankle and foot, without tophus (tophi): Secondary | ICD-10-CM | POA: Diagnosis not present

## 2022-09-28 DIAGNOSIS — M4306 Spondylolysis, lumbar region: Secondary | ICD-10-CM | POA: Diagnosis not present

## 2022-09-29 ENCOUNTER — Encounter: Payer: Self-pay | Admitting: Family Medicine

## 2022-09-29 DIAGNOSIS — R944 Abnormal results of kidney function studies: Secondary | ICD-10-CM

## 2022-09-29 DIAGNOSIS — M1A9XX0 Chronic gout, unspecified, without tophus (tophi): Secondary | ICD-10-CM

## 2022-09-29 LAB — CBC WITH DIFFERENTIAL/PLATELET
Basophils Absolute: 0.1 10*3/uL (ref 0.0–0.1)
Basophils Relative: 0.9 % (ref 0.0–3.0)
Eosinophils Absolute: 0.1 10*3/uL (ref 0.0–0.7)
Eosinophils Relative: 1.2 % (ref 0.0–5.0)
HCT: 50.2 % (ref 39.0–52.0)
Hemoglobin: 16.7 g/dL (ref 13.0–17.0)
Lymphocytes Relative: 25 % (ref 12.0–46.0)
Lymphs Abs: 2.1 10*3/uL (ref 0.7–4.0)
MCHC: 33.2 g/dL (ref 30.0–36.0)
MCV: 90.7 fl (ref 78.0–100.0)
Monocytes Absolute: 0.6 10*3/uL (ref 0.1–1.0)
Monocytes Relative: 7.4 % (ref 3.0–12.0)
Neutro Abs: 5.6 10*3/uL (ref 1.4–7.7)
Neutrophils Relative %: 65.5 % (ref 43.0–77.0)
Platelets: 229 10*3/uL (ref 150.0–400.0)
RBC: 5.53 Mil/uL (ref 4.22–5.81)
RDW: 13.6 % (ref 11.5–15.5)
WBC: 8.6 10*3/uL (ref 4.0–10.5)

## 2022-09-29 LAB — BASIC METABOLIC PANEL
BUN: 21 mg/dL (ref 6–23)
CO2: 30 mEq/L (ref 19–32)
Calcium: 9.5 mg/dL (ref 8.4–10.5)
Chloride: 100 mEq/L (ref 96–112)
Creatinine, Ser: 1.21 mg/dL (ref 0.40–1.50)
GFR: 58.31 mL/min — ABNORMAL LOW (ref >60.00)
Glucose, Bld: 93 mg/dL (ref 70–99)
Potassium: 4.2 mEq/L (ref 3.5–5.1)
Sodium: 138 mEq/L (ref 135–145)

## 2022-09-29 LAB — HEPATIC FUNCTION PANEL
ALT: 45 U/L (ref 0–53)
AST: 34 U/L (ref 0–37)
Albumin: 4.3 g/dL (ref 3.5–5.2)
Alkaline Phosphatase: 60 U/L (ref 39–117)
Bilirubin, Direct: 0.1 mg/dL (ref 0.0–0.3)
Total Bilirubin: 0.6 mg/dL (ref 0.2–1.2)
Total Protein: 7.1 g/dL (ref 6.0–8.3)

## 2022-09-29 LAB — URIC ACID: Uric Acid, Serum: 4.7 mg/dL (ref 4.0–7.8)

## 2022-10-01 NOTE — Telephone Encounter (Signed)
Pt would like to be called regarding his lab work

## 2022-10-06 NOTE — Telephone Encounter (Signed)
Spoke with pt and scheduled him for a 2 week lab appt.

## 2022-10-07 ENCOUNTER — Ambulatory Visit (INDEPENDENT_AMBULATORY_CARE_PROVIDER_SITE_OTHER): Payer: Medicare HMO | Admitting: Podiatry

## 2022-10-07 ENCOUNTER — Encounter: Payer: Self-pay | Admitting: Podiatry

## 2022-10-07 VITALS — BP 128/82

## 2022-10-07 DIAGNOSIS — B351 Tinea unguium: Secondary | ICD-10-CM

## 2022-10-07 DIAGNOSIS — M79675 Pain in left toe(s): Secondary | ICD-10-CM

## 2022-10-07 DIAGNOSIS — M79674 Pain in right toe(s): Secondary | ICD-10-CM

## 2022-10-07 NOTE — Progress Notes (Signed)
  Subjective:  Patient ID: Alexander Fitzgerald, male    DOB: 07-21-1945,  MRN: [id number]  SEDGWICK TRUNDLE presents to clinic today for: painful elongated mycotic toenails 1-5 bilaterally which are tender when wearing enclosed shoe gear. Pain is relieved with periodic professional debridement.  Chief Complaint  Patient presents with   Nail Problem    RFC,Referring Provider Glori Luis, MD,lov:02/24      PCP is Glori Luis, MD.  Allergies  Allergen Reactions   Sulfa Drugs Cross Reactors     Blood in urine    Review of Systems: Negative except as noted in the HPI.  Objective: No changes noted in today's physical examination. Vitals:   10/07/22 1133  BP: 128/82   Alexander Fitzgerald is a pleasant 77 y.o. male in NAD. AAO x 3.  Vascular Examination: Capillary refill time <3 seconds b/l LE. Palpable pedal pulses b/l LE. Digital hair present b/l. No pedal edema b/l. Skin temperature gradient WNL b/l. No varicosities b/l. Marland Kitchen  Dermatological Examination: Pedal skin with normal turgor, texture and tone b/l. No open wounds. No interdigital macerations b/l. Toenails 1-5 b/l thickened, discolored, dystrophic with subungual debris. There is pain on palpation to dorsal aspect of nailplates. Hyperkeratotic lesion(s) R hallux.  No erythema, no edema, no drainage, no fluctuance..  Neurological Examination: Protective sensation intact with 10 gram monofilament b/l LE. Vibratory sensation intact b/l LE.   Musculoskeletal Examination: Normal muscle strength 5/5 to all lower extremity muscle groups bilaterally. No pain, crepitus or joint limitation noted with ROM b/l LE. No gross bony pedal deformities b/l. Patient ambulates independently without assistive aids.  Assessment/Plan: 1. Pain due to onychomycosis of toenails of both feet     Patient was evaluated and treated. All patient's and/or POA's questions/concerns addressed on today's visit. Toenails 1-5 debrided in length and  girth without incident. Continue soft, supportive shoe gear daily. Report any pedal injuries to medical professional. Call office if there are any questions/concerns. -Will submit preauthorization to insurance company for paring of callus right great toe. -As a courtesy, callus(es) right great toe pared utilizing sterile scalpel blade without complication or incident. Total number pared=1. -Patient/POA to call should there be question/concern in the interim.   Return in about 3 months (around 01/07/2023).  Freddie Breech, DPM

## 2022-10-10 DIAGNOSIS — G4733 Obstructive sleep apnea (adult) (pediatric): Secondary | ICD-10-CM | POA: Diagnosis not present

## 2022-10-15 ENCOUNTER — Other Ambulatory Visit: Payer: Self-pay | Admitting: Family Medicine

## 2022-10-26 DIAGNOSIS — M5417 Radiculopathy, lumbosacral region: Secondary | ICD-10-CM | POA: Diagnosis not present

## 2022-10-26 DIAGNOSIS — M9905 Segmental and somatic dysfunction of pelvic region: Secondary | ICD-10-CM | POA: Diagnosis not present

## 2022-10-26 DIAGNOSIS — M4306 Spondylolysis, lumbar region: Secondary | ICD-10-CM | POA: Diagnosis not present

## 2022-10-26 DIAGNOSIS — M9903 Segmental and somatic dysfunction of lumbar region: Secondary | ICD-10-CM | POA: Diagnosis not present

## 2022-10-27 ENCOUNTER — Other Ambulatory Visit (INDEPENDENT_AMBULATORY_CARE_PROVIDER_SITE_OTHER): Payer: Medicare HMO

## 2022-10-27 DIAGNOSIS — M1A9XX0 Chronic gout, unspecified, without tophus (tophi): Secondary | ICD-10-CM | POA: Diagnosis not present

## 2022-10-27 DIAGNOSIS — R944 Abnormal results of kidney function studies: Secondary | ICD-10-CM | POA: Diagnosis not present

## 2022-10-27 LAB — BASIC METABOLIC PANEL
BUN: 25 mg/dL — ABNORMAL HIGH (ref 6–23)
CO2: 29 mEq/L (ref 19–32)
Calcium: 9.5 mg/dL (ref 8.4–10.5)
Chloride: 105 mEq/L (ref 96–112)
Creatinine, Ser: 1.08 mg/dL (ref 0.40–1.50)
GFR: 66.8 mL/min (ref >60.00)
Glucose, Bld: 106 mg/dL — ABNORMAL HIGH (ref 70–99)
Potassium: 4 mEq/L (ref 3.5–5.1)
Sodium: 141 mEq/L (ref 135–145)

## 2022-10-27 LAB — URIC ACID: Uric Acid, Serum: 4.3 mg/dL (ref 4.0–7.8)

## 2022-11-12 DIAGNOSIS — R918 Other nonspecific abnormal finding of lung field: Secondary | ICD-10-CM | POA: Diagnosis not present

## 2022-11-12 DIAGNOSIS — J4530 Mild persistent asthma, uncomplicated: Secondary | ICD-10-CM | POA: Diagnosis not present

## 2022-11-15 ENCOUNTER — Other Ambulatory Visit: Payer: Self-pay | Admitting: Family Medicine

## 2022-11-15 DIAGNOSIS — M1A9XX0 Chronic gout, unspecified, without tophus (tophi): Secondary | ICD-10-CM

## 2022-11-23 DIAGNOSIS — M9905 Segmental and somatic dysfunction of pelvic region: Secondary | ICD-10-CM | POA: Diagnosis not present

## 2022-11-23 DIAGNOSIS — M9903 Segmental and somatic dysfunction of lumbar region: Secondary | ICD-10-CM | POA: Diagnosis not present

## 2022-11-23 DIAGNOSIS — M4306 Spondylolysis, lumbar region: Secondary | ICD-10-CM | POA: Diagnosis not present

## 2022-11-23 DIAGNOSIS — M5417 Radiculopathy, lumbosacral region: Secondary | ICD-10-CM | POA: Diagnosis not present

## 2022-11-25 ENCOUNTER — Ambulatory Visit: Payer: Medicare HMO | Admitting: Dermatology

## 2022-11-25 ENCOUNTER — Encounter: Payer: Self-pay | Admitting: Dermatology

## 2022-11-25 VITALS — BP 122/79

## 2022-11-25 DIAGNOSIS — D485 Neoplasm of uncertain behavior of skin: Secondary | ICD-10-CM

## 2022-11-25 DIAGNOSIS — L578 Other skin changes due to chronic exposure to nonionizing radiation: Secondary | ICD-10-CM | POA: Diagnosis not present

## 2022-11-25 DIAGNOSIS — D229 Melanocytic nevi, unspecified: Secondary | ICD-10-CM

## 2022-11-25 DIAGNOSIS — L570 Actinic keratosis: Secondary | ICD-10-CM | POA: Diagnosis not present

## 2022-11-25 DIAGNOSIS — L814 Other melanin hyperpigmentation: Secondary | ICD-10-CM

## 2022-11-25 DIAGNOSIS — L820 Inflamed seborrheic keratosis: Secondary | ICD-10-CM

## 2022-11-25 DIAGNOSIS — L821 Other seborrheic keratosis: Secondary | ICD-10-CM | POA: Diagnosis not present

## 2022-11-25 DIAGNOSIS — C4441 Basal cell carcinoma of skin of scalp and neck: Secondary | ICD-10-CM

## 2022-11-25 DIAGNOSIS — Z1283 Encounter for screening for malignant neoplasm of skin: Secondary | ICD-10-CM | POA: Diagnosis not present

## 2022-11-25 DIAGNOSIS — W908XXA Exposure to other nonionizing radiation, initial encounter: Secondary | ICD-10-CM | POA: Diagnosis not present

## 2022-11-25 DIAGNOSIS — D1801 Hemangioma of skin and subcutaneous tissue: Secondary | ICD-10-CM | POA: Diagnosis not present

## 2022-11-25 DIAGNOSIS — C4491 Basal cell carcinoma of skin, unspecified: Secondary | ICD-10-CM

## 2022-11-25 HISTORY — DX: Basal cell carcinoma of skin, unspecified: C44.91

## 2022-11-25 NOTE — Progress Notes (Deleted)
   Follow-Up Visit   Subjective  Alexander Fitzgerald is a 77 y.o. male who presents for the following: 9 month AK follow up of scalp and face treated with LN2  The patient has spots, moles and lesions to be evaluated, some may be new or changing and the patient may have concern these could be cancer.   The following portions of the chart were reviewed this encounter and updated as appropriate: medications, allergies, medical history  Review of Systems:  No other skin or systemic complaints except as noted in HPI or Assessment and Plan.  Objective  Well appearing patient in no apparent distress; mood and affect are within normal limits.   A focused examination was performed of the following areas: Scalp, face  Relevant exam findings are noted in the Assessment and Plan.    Assessment & Plan       No follow-ups on file.     Documentation: I have reviewed the above documentation for accuracy and completeness, and I agree with the above.  [name], MD

## 2022-11-25 NOTE — Progress Notes (Signed)
Follow-Up Visit   Subjective  Alexander Fitzgerald is a 77 y.o. male who presents for the following: Skin Cancer Screening and Upper Body Skin Exam - 9 month AK follow up of scalp and face treated with LN2  The patient presents for Upper Body Skin Exam (UBSE) for skin cancer screening and mole check. The patient has spots, moles and lesions to be evaluated, some may be new or changing and the patient may have concern these could be cancer.    The following portions of the chart were reviewed this encounter and updated as appropriate: medications, allergies, medical history  Review of Systems:  No other skin or systemic complaints except as noted in HPI or Assessment and Plan.  Objective  Well appearing patient in no apparent distress; mood and affect are within normal limits.  All skin waist up examined. Relevant physical exam findings are noted in the Assessment and Plan.  Scalp, face (8) Erythematous thin papules/macules with gritty scale.   Face (3) Erythematous stuck-on, waxy papule or plaque  Left Parietal Scalp 1.8 cm crusted papule       Assessment & Plan   LENTIGINES, SEBORRHEIC KERATOSES, HEMANGIOMAS - Benign normal skin lesions - Benign-appearing - Call for any changes  MELANOCYTIC NEVI - Tan-brown and/or pink-flesh-colored symmetric macules and papules - Benign appearing on exam today - Observation - Call clinic for new or changing moles - Recommend daily use of broad spectrum spf 30+ sunscreen to sun-exposed areas.   ACTINIC DAMAGE - Chronic condition, secondary to cumulative UV/sun exposure - diffuse scaly erythematous macules with underlying dyspigmentation - Recommend daily broad spectrum sunscreen SPF 30+ to sun-exposed areas, reapply every 2 hours as needed.  - Staying in the shade or wearing long sleeves, sun glasses (UVA+UVB protection) and wide brim hats (4-inch brim around the entire circumference of the hat) are also recommended for sun protection.   - Call for new or changing lesions.  SKIN CANCER SCREENING PERFORMED TODAY   AK (actinic keratosis) (8) Scalp, face  Destruction of lesion - Scalp, face (8) Complexity: simple   Destruction method: cryotherapy   Informed consent: discussed and consent obtained   Timeout:  patient name, date of birth, surgical site, and procedure verified Lesion destroyed using liquid nitrogen: Yes   Region frozen until ice ball extended beyond lesion: Yes   Outcome: patient tolerated procedure well with no complications   Post-procedure details: wound care instructions given    Inflamed seborrheic keratosis (3) Face  Symptomatic, irritating, patient would like treated.  Benign-appearing.  Call clinic for new or changing lesions.    Destruction of lesion - Face (3) Complexity: simple   Destruction method: cryotherapy   Informed consent: discussed and consent obtained   Timeout:  patient name, date of birth, surgical site, and procedure verified Lesion destroyed using liquid nitrogen: Yes   Region frozen until ice ball extended beyond lesion: Yes   Outcome: patient tolerated procedure well with no complications   Post-procedure details: wound care instructions given    Neoplasm of uncertain behavior of skin Left Parietal Scalp  Epidermal / dermal shaving  Lesion diameter (cm):  1.8 Informed consent: discussed and consent obtained   Timeout: patient name, date of birth, surgical site, and procedure verified   Procedure prep:  Patient was prepped and draped in usual sterile fashion Prep type:  Isopropyl alcohol Anesthesia: the lesion was anesthetized in a standard fashion   Anesthetic:  1% lidocaine w/ epinephrine 1-100,000 buffered w/ 8.4% NaHCO3  Instrument used: flexible razor blade   Hemostasis achieved with: pressure, aluminum chloride and electrodesiccation   Outcome: patient tolerated procedure well   Post-procedure details: sterile dressing applied and wound care instructions  given   Dressing type: bandage and petrolatum    Destruction of lesion Complexity: extensive   Destruction method: electrodesiccation and curettage   Informed consent: discussed and consent obtained   Timeout:  patient name, date of birth, surgical site, and procedure verified Procedure prep:  Patient was prepped and draped in usual sterile fashion Prep type:  Isopropyl alcohol Anesthesia: the lesion was anesthetized in a standard fashion   Anesthetic:  1% lidocaine w/ epinephrine 1-100,000 buffered w/ 8.4% NaHCO3 Curettage performed in three different directions: Yes   Electrodesiccation performed over the curetted area: Yes   Lesion length (cm):  1.8 Lesion width (cm):  1.8 Margin per side (cm):  0.2 Final wound size (cm):  2.2 Hemostasis achieved with:  pressure and aluminum chloride Outcome: patient tolerated procedure well with no complications   Post-procedure details: sterile dressing applied and wound care instructions given   Dressing type: bandage and petrolatum    Specimen 1 - Surgical pathology Differential Diagnosis: ISK vs SCC vs other Check Margins: No   Skin cancer screening performed today.  Actinic Damage - Chronic condition, secondary to cumulative UV/sun exposure - diffuse scaly erythematous macules with underlying dyspigmentation - Recommend daily broad spectrum sunscreen SPF 30+ to sun-exposed areas, reapply every 2 hours as needed.  - Staying in the shade or wearing long sleeves, sun glasses (UVA+UVB protection) and wide brim hats (4-inch brim around the entire circumference of the hat) are also recommended for sun protection.  - Call for new or changing lesions.  Lentigines, Seborrheic Keratoses, Hemangiomas - Benign normal skin lesions - Benign-appearing - Call for any changes  Melanocytic Nevi - Tan-brown and/or pink-flesh-colored symmetric macules and papules - Benign appearing on exam today - Observation - Call clinic for new or changing  moles - Recommend daily use of broad spectrum spf 30+ sunscreen to sun-exposed areas.     Return for 6-8 months , Follow up.  I, Alexander Fitzgerald, CMA, am acting as scribe for Alexander Sans, MD .   Documentation: I have reviewed the above documentation for accuracy and completeness, and I agree with the above.  Alexander Sans, MD

## 2022-11-25 NOTE — Patient Instructions (Signed)
Cryotherapy Aftercare  Wash gently with soap and water everyday.   Apply Vaseline and Band-Aid daily until healed.    Shave Excision Benign Lesion Wound Care Instructions  Leave the original bandage on for 24 hours if possible.  If the bandage becomes soaked or soiled before that time, it is OK to remove it and examine the wound.  A small amount of post-operative bleeding is normal.  If excessive bleeding occurs, remove the bandage, place gauze over the site and apply continuous pressure (no peeking) over the area for 20-30 minutes.  If this does not stop the bleeding, try again for 40 minutes.  If this does not work, please call our clinic as soon as possible (even if after-hours).    Twice a day, cleanse the wound with soap and water.  If a thick crust develops you may use a Q-tip dipped into dilute hydrogen peroxide (mix 1:1 with water) to dissolve it.  Hydrogen peroxide can slow the healing process, so use it only as needed.  After washing, apply Vaseline jelly or Polysporin ointment.  For best healing, the wound should be covered with a layer of ointment at all times.  This may mean re-applying the ointment several times a day.  For open wounds, continue until it has healed.    If you have any swelling, keep the area elevated.  Some redness, tenderness and white or yellow material in the wound is normal healing.  If the area becomes very sore and red, or develops a thick yellow-green material (pus), it may be infected; please notify us.    Wound healing continues for up to one year following surgery.  It is not unusual to experience pain in the scar from time to time during the interval.  If the pain becomes severe or the scar thickens, you should notify the office.  A slight amount of redness in a scar is expected for the first six months.  After six months, the redness subsides and the scar will soften and fade.  The color difference becomes less noticeable with time.  If there are any  problems, return for a post-op surgery check at your earliest convenience.  Please call our office for any questions or concerns.  Due to recent changes in healthcare laws, you may see results of your pathology and/or laboratory studies on MyChart before the doctors have had a chance to review them. We understand that in some cases there may be results that are confusing or concerning to you. Please understand that not all results are received at the same time and often the doctors may need to interpret multiple results in order to provide you with the best plan of care or course of treatment. Therefore, we ask that you please give [place] 2 business days to thoroughly review all your results before contacting the office for clarification. Should we see a critical lab result, you will be contacted sooner.   If You Need Anything After Your Visit  If you have any questions or concerns for your doctor, please call our main line at [phone] and press option 4 to reach your doctor's medical assistant. If no one answers, please leave a voicemail as directed and we will return your call as soon as possible. Messages left after 4 pm will be answered the following business day.   You may also send [place] a message via MyChart. We typically respond to MyChart messages within 1-2 business days.  For prescription refills, please ask your pharmacy  to contact our office. Our fax number is [phone].  If you have an urgent issue when the clinic is closed that cannot wait until the next business day, you can page your doctor at the number below.    Please note that while we do our best to be available for urgent issues outside of office hours, we are not available 24/7.   If you have an urgent issue and are unable to reach [place], you may choose to seek medical care at your doctor's office, retail clinic, urgent care center, or emergency room.  If you have a medical emergency, please immediately call 911 or go to the  emergency department.  Pager Numbers  - Dr. Gwen Pounds: [phone]  - Dr. Roseanne Reno: [phone]  In the event of inclement weather, please call our main line at [phone] for an update on the status of any delays or closures.  Dermatology Medication Tips: Please keep the boxes that topical medications come in in order to help keep track of the instructions about where and how to use these. Pharmacies typically print the medication instructions only on the boxes and not directly on the medication tubes.   If your medication is too expensive, please contact our office at [phone] option 4 or send [place] a message through MyChart.   We are unable to tell what your co-pay for medications will be in advance as this is different depending on your insurance coverage. However, we may be able to find a substitute medication at lower cost or fill out paperwork to get insurance to cover a needed medication.   If a prior authorization is required to get your medication covered by your insurance company, please allow [place] 1-2 business days to complete this process.  Drug prices often vary depending on where the prescription is filled and some pharmacies may offer cheaper prices.  The website www.goodrx.com contains coupons for medications through different pharmacies. The prices here do not account for what the cost may be with help from insurance (it may be cheaper with your insurance), but the website can give you the price if you did not use any insurance.  - You can print the associated coupon and take it with your prescription to the pharmacy.  - You may also stop by our office during regular business hours and pick up a GoodRx coupon card.  - If you need your prescription sent electronically to a different pharmacy, notify our office through [facility] or by phone at [phone] option 4.     Si Usted Necesita Algo Despus de Su Visita  Tambin puede enviarnos un mensaje a travs  de Clinical cytogeneticist. Por lo general respondemos a los mensajes de MyChart en el transcurso de 1 a 2 das hbiles.  Para renovar recetas, por favor pida a su farmacia que se ponga en contacto con nuestra oficina. Annie Sable de fax es [place] [phone].  Si tiene un asunto urgente cuando la clnica est cerrada y que no puede esperar hasta el siguiente da hbil, puede llamar/localizar a su doctor(a) al nmero que aparece a continuacin.   Por favor, tenga en cuenta que aunque hacemos todo lo posible para estar disponibles para asuntos urgentes fuera del horario de [place], no estamos disponibles las 24 horas del da, los 7 [address]  [address] de la [place].   Si tiene un problema urgente y no puede comunicarse con nosotros, puede optar por buscar atencin mdica  en el consultorio de su doctor(a), en una clnica privada, en un centro  de atencin urgente o en una sala de emergencias.  Si tiene Engineer, drilling, por favor llame inmediatamente al 911 o vaya a la sala de emergencias.  Nmeros de bper  - Dr. Gwen Pounds: [phone]  - Dra. Roseanne Reno: [phone]  En caso de inclemencias del [place], por favor llame a Lacy Duverney principal al [phone] para una actualizacin sobre el [place] de cualquier retraso o cierre.  Consejos para la medicacin en dermatologa: Por favor, guarde las cajas en las que vienen los medicamentos de uso tpico para ayudarle a seguir las instrucciones sobre dnde y cmo usarlos. Las farmacias generalmente imprimen las instrucciones del medicamento slo en las cajas y no directamente en los tubos del [place].   Si su medicamento es muy caro, por favor, pngase en contacto con Rolm Gala llamando al [phone] y presione la opcin 4 o envenos un mensaje a travs de Clinical cytogeneticist.   No podemos decirle cul ser su copago por los medicamentos por adelantado ya que esto es diferente dependiendo de la cobertura de su seguro. Sin embargo, es posible que podamos encontrar un  medicamento sustituto a Audiological scientist un formulario para que el seguro cubra el medicamento que se considera necesario.   Si se requiere una autorizacin previa para que su compaa de seguros [place] su medicamento, por favor permtanos de 1 a 2 das hbiles para completar [address]  Los precios de los medicamentos varan con frecuencia dependiendo del Environmental consultant de dnde se surte la receta y alguna farmacias pueden ofrecer precios ms baratos.  El sitio web www.goodrx.com tiene cupones para medicamentos de Health and safety inspector. Los precios aqu no tienen en cuenta lo que podra costar con la ayuda del seguro (puede ser ms barato con su seguro), pero el sitio web puede darle el precio si no utiliz Tourist information centre manager.  - Puede imprimir el cupn correspondiente y llevarlo con su receta a la farmacia.  - Tambin puede pasar por nuestra oficina durante el horario de atencin regular y Education officer, museum una tarjeta de cupones de GoodRx.  - Si necesita que su receta se enve electrnicamente a una farmacia diferente, informe a nuestra oficina a travs de MyChart de [place] o por telfono llamando al [phone] y presione la opcin 4.

## 2022-12-01 ENCOUNTER — Telehealth: Payer: Self-pay

## 2022-12-01 NOTE — Telephone Encounter (Signed)
-----   Message from [name] sent at 11/30/2022  5:51 PM EDT ----- Diagnosis Skin , left parietal scalp BASAL CELL CARCINOMA, NODULAR PATTERN, CRUSTED  Cancer = BCC Already treated Recheck next visit

## 2022-12-01 NOTE — Telephone Encounter (Signed)
Advised pt of bx results/sh ?

## 2022-12-05 ENCOUNTER — Encounter: Payer: Self-pay | Admitting: Dermatology

## 2022-12-14 DIAGNOSIS — M9903 Segmental and somatic dysfunction of lumbar region: Secondary | ICD-10-CM | POA: Diagnosis not present

## 2022-12-14 DIAGNOSIS — M9905 Segmental and somatic dysfunction of pelvic region: Secondary | ICD-10-CM | POA: Diagnosis not present

## 2022-12-14 DIAGNOSIS — M4306 Spondylolysis, lumbar region: Secondary | ICD-10-CM | POA: Diagnosis not present

## 2022-12-14 DIAGNOSIS — M5417 Radiculopathy, lumbosacral region: Secondary | ICD-10-CM | POA: Diagnosis not present

## 2022-12-16 ENCOUNTER — Ambulatory Visit: Payer: Medicare HMO | Admitting: Podiatry

## 2022-12-16 ENCOUNTER — Encounter: Payer: Self-pay | Admitting: Podiatry

## 2022-12-16 DIAGNOSIS — M79675 Pain in left toe(s): Secondary | ICD-10-CM | POA: Diagnosis not present

## 2022-12-16 DIAGNOSIS — M79674 Pain in right toe(s): Secondary | ICD-10-CM

## 2022-12-16 DIAGNOSIS — B351 Tinea unguium: Secondary | ICD-10-CM

## 2022-12-19 NOTE — Progress Notes (Signed)
  Subjective:  Patient ID: Alexander Fitzgerald, male    DOB: 1946-04-04,  MRN: [id number]  Alexander Fitzgerald presents to clinic today for painful thick toenails that are difficult to trim. Pain interferes with ambulation. Aggravating factors include wearing enclosed shoe gear. Pain is relieved with periodic professional debridement.  Chief Complaint  Patient presents with   Nail Problem    RFC,Referring Provider Glori Luis, MD,lov:05/24      New problem(s): None.   PCP is Glori Luis, MD.  Allergies  Allergen Reactions   Sulfa Drugs Cross Reactors     Blood in urine    Review of Systems: Negative except as noted in the HPI.  Objective: No changes noted in today's physical examination. There were no vitals filed for this visit. Alexander Fitzgerald is a pleasant 77 y.o. male obese in NAD. AAO x 3.  Vascular Examination: Capillary refill time <3 seconds b/l LE. Palpable pedal pulses b/l LE. Digital hair present b/l. No pedal edema b/l. Skin temperature gradient WNL b/l. No varicosities b/l. Marland Kitchen  Dermatological Examination: Pedal skin with normal turgor, texture and tone b/l. No open wounds. No interdigital macerations b/l. Toenails 1-5 b/l thickened, discolored, dystrophic with subungual debris. There is pain on palpation to dorsal aspect of nailplates.   Hyperkeratotic lesion(s) R hallux.  No erythema, no edema, no drainage, no fluctuance..  Neurological Examination: Protective sensation intact with 10 gram monofilament b/l LE. Vibratory sensation intact b/l LE.   Musculoskeletal Examination: Normal muscle strength 5/5 to all lower extremity muscle groups bilaterally. No pain, crepitus or joint limitation noted with ROM b/l LE. No gross bony pedal deformities b/l. Patient ambulates independently without assistive aids.  Assessment/Plan: 1. Pain due to onychomycosis of toenails of both feet    -Consent given for treatment as described below: -Examined  patient. -Still awaiting decision on paring of callus right hallux. -Patient to continue soft, supportive shoe gear daily. -Mycotic toenails 1-5 bilaterally were debrided in length and girth with sterile nail nippers and dremel without incident. -As a courtesy, callus(es) right great toe pared utilizing sterile scalpel blade without complication or incident. Total number pared=1. Patient notified. -Patient/POA to call should there be question/concern in the interim.   Return in about 3 months (around 03/18/2023).  Freddie Breech, DPM

## 2023-01-03 DIAGNOSIS — M1611 Unilateral primary osteoarthritis, right hip: Secondary | ICD-10-CM | POA: Diagnosis not present

## 2023-01-03 DIAGNOSIS — M25551 Pain in right hip: Secondary | ICD-10-CM | POA: Diagnosis not present

## 2023-01-04 ENCOUNTER — Encounter: Payer: Self-pay | Admitting: Family Medicine

## 2023-01-04 NOTE — Telephone Encounter (Signed)
Can we please get the patient scheduled for sometime late November or early December?

## 2023-01-04 NOTE — Telephone Encounter (Signed)
Patient is scheduled for Monday 03/28/23 at 1:00.

## 2023-01-06 DIAGNOSIS — M5417 Radiculopathy, lumbosacral region: Secondary | ICD-10-CM | POA: Diagnosis not present

## 2023-01-06 DIAGNOSIS — M9905 Segmental and somatic dysfunction of pelvic region: Secondary | ICD-10-CM | POA: Diagnosis not present

## 2023-01-06 DIAGNOSIS — M4306 Spondylolysis, lumbar region: Secondary | ICD-10-CM | POA: Diagnosis not present

## 2023-01-06 DIAGNOSIS — M9903 Segmental and somatic dysfunction of lumbar region: Secondary | ICD-10-CM | POA: Diagnosis not present

## 2023-01-11 DIAGNOSIS — G4733 Obstructive sleep apnea (adult) (pediatric): Secondary | ICD-10-CM | POA: Diagnosis not present

## 2023-01-12 ENCOUNTER — Other Ambulatory Visit: Payer: Self-pay | Admitting: Dermatology

## 2023-01-25 DIAGNOSIS — M4306 Spondylolysis, lumbar region: Secondary | ICD-10-CM | POA: Diagnosis not present

## 2023-01-25 DIAGNOSIS — M9905 Segmental and somatic dysfunction of pelvic region: Secondary | ICD-10-CM | POA: Diagnosis not present

## 2023-01-25 DIAGNOSIS — M9903 Segmental and somatic dysfunction of lumbar region: Secondary | ICD-10-CM | POA: Diagnosis not present

## 2023-01-25 DIAGNOSIS — M5417 Radiculopathy, lumbosacral region: Secondary | ICD-10-CM | POA: Diagnosis not present

## 2023-01-27 DIAGNOSIS — H524 Presbyopia: Secondary | ICD-10-CM | POA: Diagnosis not present

## 2023-01-28 DIAGNOSIS — Z0100 Encounter for examination of eyes and vision without abnormal findings: Secondary | ICD-10-CM | POA: Diagnosis not present

## 2023-02-02 ENCOUNTER — Telehealth: Payer: Self-pay | Admitting: Cardiology

## 2023-02-02 ENCOUNTER — Encounter: Payer: Self-pay | Admitting: Cardiology

## 2023-02-02 NOTE — Telephone Encounter (Signed)
Left voicemail to schedule surgical clearance appointment.

## 2023-02-12 ENCOUNTER — Other Ambulatory Visit: Payer: Self-pay | Admitting: Family Medicine

## 2023-02-12 DIAGNOSIS — M1A9XX0 Chronic gout, unspecified, without tophus (tophi): Secondary | ICD-10-CM

## 2023-02-15 ENCOUNTER — Ambulatory Visit (INDEPENDENT_AMBULATORY_CARE_PROVIDER_SITE_OTHER): Payer: Medicare HMO | Admitting: Podiatry

## 2023-02-15 ENCOUNTER — Encounter: Payer: Self-pay | Admitting: Podiatry

## 2023-02-15 DIAGNOSIS — M9905 Segmental and somatic dysfunction of pelvic region: Secondary | ICD-10-CM | POA: Diagnosis not present

## 2023-02-15 DIAGNOSIS — M9903 Segmental and somatic dysfunction of lumbar region: Secondary | ICD-10-CM | POA: Diagnosis not present

## 2023-02-15 DIAGNOSIS — M4306 Spondylolysis, lumbar region: Secondary | ICD-10-CM | POA: Diagnosis not present

## 2023-02-15 DIAGNOSIS — L600 Ingrowing nail: Secondary | ICD-10-CM

## 2023-02-15 DIAGNOSIS — M5417 Radiculopathy, lumbosacral region: Secondary | ICD-10-CM | POA: Diagnosis not present

## 2023-02-15 NOTE — Progress Notes (Signed)
Subjective:  Patient ID: Alexander Fitzgerald, male    DOB: 24-Dec-1945,  MRN: [id number]  Chief Complaint  Patient presents with   Nail Problem    "I was told I needed to see a surgeon about this funky toenail.  I can't wait to have it done every 90 days." (3rd right)    77 y.o. male presents with the above complaint.  Patient presents with complaint of right third lateral border ingrown painful to touch is progressive gotten worse worse with ambulation worse with pressure he has not seen anyone else prior to seeing me he would like to remove it.  Denies any other acute complaints.   Review of Systems: Negative except as noted in the HPI. Denies N/V/F/Ch.  Past Medical History:  Diagnosis Date   Asthma    adult onset, PFT[place] done in [place]   Basal cell carcinoma 11/25/2022   Left Parietal Scalp, [facility]   Cancer (HCC)    Several moles removed in past, all pre-cancerous.  Recently followed by Dr. Gwen Pounds   Depression    Treated with Prozac in 1992,  Associate with job loss   Esophageal reflux    Gout    Hiatal hernia    Hypertension    Squamous cell carcinoma of skin 07/30/2015   R anterior lat neck, Atypical squamous proliferation, excised 08/28/2015    Current Outpatient Medications:    albuterol (VENTOLIN HFA) 108 (90 Base) MCG/ACT inhaler, INHALE 2 PUFFS 4 TIMES A DAY AS NEEDED, Disp: 8.5 each, Rfl: 2   Ascorbic Acid (VITAMIN C) 1000 MG tablet, Take 1,000 mg by mouth daily. Reported on 06/26/2015, Disp: , Rfl:    aspirin EC 81 MG tablet, Take 1 tablet (81 mg total) by mouth daily. Swallow whole., Disp: 90 tablet, Rfl: 3   Cholecalciferol (VITAMIN D3) 2000 units capsule, Take 4,000 Units by mouth., Disp: , Rfl:    colchicine 0.6 MG tablet, TAKE 1 TABLET BY MOUTH EVERY DAY, Disp: 90 tablet, Rfl: 0   CORLANOR 5 MG TABS tablet, Take by mouth., Disp: , Rfl:    Cyanocobalamin (VITAMIN B-12) 5000 MCG SUBL, Place 2 each under the tongue daily., Disp: , Rfl:    cyclobenzaprine (FLEXERIL) 10 MG  tablet, Take 1 tablet (10 mg total) by mouth 3 (three) times daily as needed for muscle spasms., Disp: 30 tablet, Rfl: 0   Dapsone 5 % topical gel, Aczone 5 % topical gel  APPLY A SMALL AMOUNT TO AFFECTED AREA EVERY MORNING, Disp: , Rfl:    dexlansoprazole (DEXILANT) 60 MG capsule, SMARTSIG:1 Pill By Mouth Daily, Disp: , Rfl:    docusate sodium (COLACE) 100 MG capsule, Take 100 mg by mouth daily., Disp: , Rfl:    doxycycline (VIBRAMYCIN) 50 MG capsule, TAKE 1 CAPSULE BY MOUTH EVERY DAY, Disp: 90 capsule, Rfl: 2   DUPIXENT 300 MG/2ML SOPN, Inject into the skin., Disp: , Rfl:    febuxostat (ULORIC) 40 MG tablet, Take 1 tablet (40 mg total) by mouth daily., Disp: 90 tablet, Rfl: 1   finasteride (PROSCAR) 5 MG tablet, TAKE 1 TABLET BY MOUTH EVERY DAY, Disp: 90 tablet, Rfl: 1   ketoconazole (NIZORAL) 2 % shampoo, Shampoo from the waist up let sit 5 minutes then wash off. Use twice per week for 2 mths. Then QM there after., Disp: 120 mL, Rfl: 11   levothyroxine (SYNTHROID) 100 MCG tablet, TAKE 1 TABLET BY MOUTH DAILY BEFORE BREAKFAST., Disp: 90 tablet, Rfl: 1   lisinopril (ZESTRIL) 10 MG tablet,  Take 1 tablet (10 mg total) by mouth daily., Disp: 90 tablet, Rfl: 3   metroNIDAZOLE (METROGEL) 1 % gel, metronidazole 1 % topical gel  APPLY TO AFFECTED AREA EVERY DAY, Disp: , Rfl:    montelukast (SINGULAIR) 10 MG tablet, TAKE 1 TABLET BY MOUTH EVERYDAY AT BEDTIME, Disp: 90 tablet, Rfl: 1   Multiple Vitamin (MULTIVITAMIN) capsule, Take 1 capsule by mouth daily., Disp: , Rfl:    Pyridoxine HCl 200 MG TBCR, Take by mouth., Disp: , Rfl:    RYALTRIS 665-25 MCG/ACT SUSP, SMARTSIG:1 Spray(s) Both Nares Every 12 Hours PRN, Disp: , Rfl:    torsemide (DEMADEX) 10 MG tablet, TAKE 1 TABLET BY MOUTH EVERY DAY, Disp: 90 tablet, Rfl: 1   TRELEGY ELLIPTA 200-62.5-25 MCG/ACT AEPB, Inhale 1 puff into the lungs daily., Disp: , Rfl:   Social History   Tobacco Use  Smoking Status Former   Current packs/day: 0.00   Average  packs/day: 0.5 packs/day for 20.0 years (10.0 ttl pk-yrs)   Types: Cigarettes   Start date: 06/15/1979   Quit date: 06/15/1999   Years since quitting: 23.6  Smokeless Tobacco Never    Allergies  Allergen Reactions   Sulfa Drugs Cross Reactors     Blood in urine   Objective:  There were no vitals filed for this visit. There is no height or weight on file to calculate BMI. Constitutional Well developed. Well nourished.  Vascular Dorsalis pedis pulses palpable bilaterally. Posterior tibial pulses palpable bilaterally. Capillary refill normal to all digits.  No cyanosis or clubbing noted. Pedal hair growth normal.  Neurologic Normal speech. Oriented to person, place, and time. Epicritic sensation to light touch grossly present bilaterally.  Dermatologic Painful ingrowing nail at lateral nail borders of the third nail right. No other open wounds. No skin lesions.  Orthopedic: Normal joint ROM without pain or crepitus bilaterally. No visible deformities. No bony tenderness.   Radiographs: None Assessment:  No diagnosis found. Plan:  Patient was evaluated and treated and all questions answered.  Ingrown Nail, right third digit -Patient elects to proceed with minor surgery to remove ingrown toenail removal today. Consent reviewed and signed by patient. -Ingrown nail excised. See procedure note. -Educated on post-procedure care including soaking. Written instructions provided and reviewed. -Patient to follow up in 2 weeks for nail check.  Procedure: Excision of Ingrown Toenail Location: Right 3rd toe lateral nail borders. Anesthesia: Lidocaine 1% plain; 1.5 mL and Marcaine 0.5% plain; 1.5 mL, digital block. Skin Prep: Betadine. Dressing: Silvadene; telfa; dry, sterile, compression dressing. Technique: Following skin prep, the toe was exsanguinated and a tourniquet was secured at the base of the toe. The affected nail border was freed, split with a nail splitter, and excised.  Chemical matrixectomy was then performed with phenol and irrigated out with alcohol. The tourniquet was then removed and sterile dressing applied. Disposition: Patient tolerated procedure well. Patient to return in 2 weeks for follow-up.   No follow-ups on file.

## 2023-02-17 DIAGNOSIS — J4530 Mild persistent asthma, uncomplicated: Secondary | ICD-10-CM | POA: Diagnosis not present

## 2023-02-17 DIAGNOSIS — J8283 Eosinophilic asthma: Secondary | ICD-10-CM | POA: Diagnosis not present

## 2023-02-22 ENCOUNTER — Ambulatory Visit (INDEPENDENT_AMBULATORY_CARE_PROVIDER_SITE_OTHER): Payer: Medicare HMO | Admitting: *Deleted

## 2023-02-22 VITALS — Ht 70.0 in | Wt 213.0 lb

## 2023-02-22 DIAGNOSIS — Z0000 Encounter for general adult medical examination without abnormal findings: Secondary | ICD-10-CM

## 2023-02-22 NOTE — Progress Notes (Signed)
Subjective:   Alexander Fitzgerald is a 77 y.o. male who presents for Medicare Annual/Subsequent preventive examination.  Visit Complete: Virtual I connected with  Margreta Journey on 02/22/23 by a audio enabled telemedicine application and verified that I am speaking with the correct person using two identifiers.  Patient Location: Home  Provider Location: Office/Clinic  I discussed the limitations of evaluation and management by telemedicine. The patient expressed understanding and agreed to proceed.  Vital Signs: Because this visit was a virtual/telehealth visit, some criteria may be missing or patient reported. Any vitals not documented were not able to be obtained and vitals that have been documented are patient reported. ion answered by patient is correct and no changes since this date.  Cardiac Risk Factors include: advanced age (>57men, >38 women);dyslipidemia;hypertension;male gender;obesity (BMI >30kg/m2)     Objective:    Today's Vitals   02/22/23 0943 02/22/23 0944  Weight: 213 lb (96.6 kg)   Height: 5\' 10"  (1.778 m)   PainSc:  3    Body mass index is 30.56 kg/m.     02/22/2023   10:01 AM 01/26/2022   12:44 PM 01/12/2021    9:56 AM 01/10/2020    9:46 AM 01/09/2019    9:39 AM 12/16/2017    9:44 AM 05/05/2015    9:51 AM  Advanced Directives  Does Patient Have a Medical Advance Directive? Yes Yes Yes Yes Yes Yes Yes  Type of Estate agent of [place];Living will Healthcare Power of [place];Living will Healthcare Power of [place];Living will Healthcare Power of [place];Living will Healthcare Power of [place];Living will Healthcare Power of [place];Living will Healthcare Power of [place];Living will  Does patient want to make changes to medical advance directive?  No - Patient declined No - Patient declined No - Patient declined No - Patient declined No - Patient declined No - Patient declined  Copy of Healthcare Power of Attorney in Chart? No  - copy requested No - copy requested No - copy requested No - copy requested No - copy requested No - copy requested No - copy requested    Current Medications (verified) Outpatient Encounter Medications as of 02/22/2023  Medication Sig   albuterol (VENTOLIN HFA) 108 (90 Base) MCG/ACT inhaler INHALE 2 PUFFS 4 TIMES A DAY AS NEEDED   Ascorbic Acid (VITAMIN C) 1000 MG tablet Take 1,000 mg by mouth daily. Reported on 06/26/2015   aspirin EC 81 MG tablet Take 1 tablet (81 mg total) by mouth daily. Swallow whole.   Cholecalciferol (VITAMIN D3) 2000 units capsule Take 4,000 Units by mouth.   colchicine 0.6 MG tablet TAKE 1 TABLET BY MOUTH EVERY DAY   CORLANOR 5 MG TABS tablet Take by mouth.   cyclobenzaprine (FLEXERIL) 10 MG tablet Take 1 tablet (10 mg total) by mouth 3 (three) times daily as needed for muscle spasms.   Dapsone 5 % topical gel Aczone 5 % topical gel  APPLY A SMALL AMOUNT TO AFFECTED AREA EVERY MORNING   dexlansoprazole (DEXILANT) 60 MG capsule SMARTSIG:1 Pill By Mouth Daily   docusate sodium (COLACE) 100 MG capsule Take 100 mg by mouth daily.   doxycycline (VIBRAMYCIN) 50 MG capsule TAKE 1 CAPSULE BY MOUTH EVERY DAY   DUPIXENT 300 MG/2ML SOPN Inject into the skin.   febuxostat (ULORIC) 40 MG tablet Take 1 tablet (40 mg total) by mouth daily.   finasteride (PROSCAR) 5 MG tablet TAKE 1 TABLET BY MOUTH EVERY DAY   ketoconazole (NIZORAL) 2 % shampoo Shampoo from  the waist up let sit 5 minutes then wash off. Use twice per week for 2 mths. Then QM there after.   levothyroxine (SYNTHROID) 100 MCG tablet TAKE 1 TABLET BY MOUTH DAILY BEFORE BREAKFAST.   lisinopril (ZESTRIL) 10 MG tablet Take 1 tablet (10 mg total) by mouth daily.   montelukast (SINGULAIR) 10 MG tablet TAKE 1 TABLET BY MOUTH EVERYDAY AT BEDTIME   Multiple Vitamin (MULTIVITAMIN) capsule Take 1 capsule by mouth daily.   Pyridoxine HCl 200 MG TBCR Take by mouth.   RYALTRIS 665-25 MCG/ACT SUSP SMARTSIG:1 Spray(s) Both Nares  Every 12 Hours PRN   torsemide (DEMADEX) 10 MG tablet TAKE 1 TABLET BY MOUTH EVERY DAY   TRELEGY ELLIPTA 200-62.5-25 MCG/ACT AEPB Inhale 1 puff into the lungs daily.   Cyanocobalamin (VITAMIN B-12) 5000 MCG SUBL Place 2 each under the tongue daily. (Patient not taking: Reported on 02/22/2023)   metroNIDAZOLE (METROGEL) 1 % gel metronidazole 1 % topical gel  APPLY TO AFFECTED AREA EVERY DAY (Patient not taking: Reported on 02/22/2023)   No facility-administered encounter medications on file as of 02/22/2023.    Allergies (verified) Sulfa drugs cross reactors   History: Past Medical History:  Diagnosis Date   Asthma    adult onset, PFTs done in [place]   Basal cell carcinoma 11/25/2022   Left Parietal Scalp, [facility]   Cancer (HCC)    Several moles removed in past, all pre-cancerous.  Recently followed by Dr. Gwen Pounds   Depression    Treated with Prozac in 1992,  Associate with job loss   Esophageal reflux    Gout    Hiatal hernia    Hypertension    Squamous cell carcinoma of skin 07/30/2015   R anterior lat neck, Atypical squamous proliferation, excised 08/28/2015   Past Surgical History:  Procedure Laterality Date   APPENDECTOMY     COLONOSCOPY N/A 12/27/2014   Procedure: COLONOSCOPY;  Surgeon: Christena Deem, MD;  Location: [facility] ENDOSCOPY;  Service: Endoscopy;  Laterality: N/A;   LAPAROSCOPIC PARAESOPHAGEAL HERNIA REPAIR     TONSILLECTOMY     Family History  Problem Relation Age of Onset   Hypertension Mother    Hyperlipidemia Mother    Diabetes Mother    Heart attack Father 81       MI   Hypertension Father    Cancer Sister        pancreatic   Social History   Socioeconomic History   Marital status: Married    Spouse name: Not on file   Number of children: Not on file   Years of education: Not on file   Highest education level: Not on file  Occupational History   Not on file  Tobacco Use   Smoking status: Former    Current packs/day: 0.00    Average packs/day: 0.5  packs/day for 20.0 years (10.0 ttl pk-yrs)    Types: Cigarettes    Start date: 06/15/1979    Quit date: 06/15/1999    Years since quitting: 23.7   Smokeless tobacco: Never  Substance and Sexual Activity   Alcohol use: Not Currently    Alcohol/week: 1.0 standard drink of alcohol    Types: 1 Standard drinks or equivalent per week    Comment: rarely   Drug use: No   Sexual activity: Yes  Other Topics Concern   Not on file  Social History Narrative   married   Social Determinants of Health   Financial Resource Strain: Low Risk  (02/22/2023)  Overall Financial Resource Strain (CARDIA)    Difficulty of Paying Living Expenses: Not hard at all  Food Insecurity: No Food Insecurity (02/22/2023)   Hunger Vital Sign    Worried About Running Out of Food in the Last Year: Never true    Ran Out of Food in the Last Year: Never true  Transportation Needs: No Transportation Needs (02/22/2023)   PRAPARE - Administrator, Civil Service (Medical): No    Lack of Transportation (Non-Medical): No  Physical Activity: Sufficiently Active (02/22/2023)   Exercise Vital Sign    Days of Exercise per Week: 7 days    Minutes of Exercise per Session: 80 min  Stress: No Stress Concern Present (02/22/2023)   [employer] of Occupational Health - Occupational Stress Questionnaire    Feeling of Stress : Not at all  Social Connections: Socially Integrated (02/22/2023)   Social Connection and Isolation Panel [NHANES]    Frequency of Communication with Friends and Family: More than three times a week    Frequency of Social Gatherings with Friends and Family: More than three times a week    Attends Religious Services: More than 4 times per year    Active Member of [employer] or Organizations: Yes    Attends Engineer, structural: More than 4 times per year    Marital Status: Married    Tobacco Counseling Counseling given: Not Answered   Clinical Intake:  Pre-visit preparation completed:  Yes  Pain : 0-10 Pain Score: 3  Pain Type: Chronic pain Pain Location: Hip Pain Orientation: Right Pain Descriptors / Indicators: Dull, Aching Pain Onset: More than a month ago Pain Frequency: Intermittent     BMI - recorded: 30.56 Nutritional Status: BMI > 30  Obese Nutritional Risks: None Diabetes: No  How often do you need to have someone help you when you read instructions, pamphlets, or other written materials from your doctor or pharmacy?: 1 - Never  Interpreter Needed?: No  Information entered by :: R. Eros Montour LPN   Activities of Daily Living    02/22/2023    9:48 AM  In your present state of health, do you have any difficulty performing the following activities:  Hearing? 1  Comment wears aids  Vision? 0  Comment glasses  Difficulty concentrating or making decisions? 0  Walking or climbing stairs? 0  Dressing or bathing? 0  Doing errands, shopping? 0  Preparing Food and eating ? N  Using the Toilet? N  In the past six months, have you accidently leaked urine? N  Do you have problems with loss of bowel control? N  Managing your Medications? N  Managing your Finances? N  Housekeeping or managing your Housekeeping? N    Patient Care Team: Glori Luis, MD as PCP - General (Family Medicine) Debbe Odea, MD as PCP - Cardiology (Cardiology) Antonieta Iba, MD as Consulting Physician (Cardiology)  Indicate any recent Medical Services you may have received from other than [facility] providers in the past year (date may be approximate).     Assessment:   This is a routine wellness examination for [place].  Hearing/Vision screen Hearing Screening - Comments:: Wears aids Vision Screening - Comments:: glasses   Goals Addressed             This Visit's Progress    Patient Stated       Wants to lose some weight       Depression Screen    02/22/2023    9:56  AM 05/26/2022   10:46 AM 02/26/2022    1:39 PM 01/26/2022   12:38 PM 06/29/2021    10:37 AM 01/12/2021    9:57 AM 12/29/2020   10:06 AM  PHQ 2/9 Scores  PHQ - 2 Score 0 0 0 0 0 0 0  PHQ- 9 Score 0          Fall Risk    02/22/2023    9:49 AM 05/26/2022   10:46 AM 02/26/2022    1:39 PM 01/26/2022   12:38 PM 06/29/2021   10:37 AM  Fall Risk   Falls in the past year? 0 0 0 0 0  Number falls in past yr: 0 0 0 0 0  Injury with Fall? 0 0 0 0 0  Risk for fall due to : No Fall Risks No Fall Risks No Fall Risks No Fall Risks No Fall Risks  Follow up Falls prevention discussed;Falls evaluation completed Falls evaluation completed Falls evaluation completed Falls evaluation completed Falls evaluation completed    MEDICARE RISK AT HOME: Medicare Risk at Home Any stairs in or around the home?: Yes If so, are there any without handrails?: No Home free of loose throw rugs in walkways, pet beds, electrical cords, etc?: Yes Adequate lighting in your home to reduce risk of falls?: Yes Life alert?: Yes Use of a cane, walker or w/c?: No Grab bars in the bathroom?: No Shower chair or bench in shower?: No Elevated toilet seat or a handicapped toilet?: Yes   Cognitive Function:    12/16/2017    9:37 AM 05/05/2015   10:03 AM  MMSE - Mini Mental State Exam  Orientation to time 5 5  Orientation to Place 5 5  Registration 3 3  Attention/ Calculation 5 5  Recall 3 3  Language- name 2 objects 2 2  Language- repeat 1 1  Language- follow 3 step command 3 3  Language- read & follow direction 1 1  Write a sentence 1 1  Copy design 1 1  Total score 30 30        02/22/2023   10:01 AM 01/26/2022   12:39 PM 01/09/2019    9:53 AM  6CIT Screen  What Year? 0 points  0 points  What month? 0 points  0 points  What time? 0 points  0 points  Count back from 20 0 points  0 points  Months in reverse 0 points 0 points 0 points  Repeat phrase 0 points  0 points  Total Score 0 points  0 points    Immunizations Immunization History  Administered Date(s) Administered   Fluad Quad(high  Dose 65+) 01/01/2019, 12/19/2019, 12/29/2020   Influenza Split 03/03/2013   Influenza, High Dose Seasonal PF 02/08/2017, 02/13/2018, 01/01/2019, 02/15/2023   Influenza,inj,Quad PF,6+ Mos 01/16/2014, 12/09/2021   Influenza-Unspecified 01/25/2012, 02/18/2015, 02/01/2016, 02/08/2017   Moderna Sars-Covid-2 Vaccination 03/24/2020, 09/14/2020   Pneumococcal Conjugate-13 06/04/2013   Pneumococcal Polysaccharide-23 10/25/2008, 05/05/2015   Respiratory Syncytial Virus Vaccine,Recomb Aduvanted(Arexvy) 03/05/2022   Tdap 04/26/2009, 02/15/2023   Zoster Recombinant(Shingrix) 09/19/2017, 10/12/2017, 05/09/2022, 08/19/2022   Zoster, Live 03/03/2013    TDAP status: Up to date  Flu Vaccine status: Up to date  Pneumococcal vaccine status: Up to date  Covid-19 vaccine status: Information provided on how to obtain vaccines.   Qualifies for Shingles Vaccine? Yes   Zostavax completed Yes   Shingrix Completed?: Yes  Screening Tests Health Maintenance  Topic Date Due   COVID-19 Vaccine (3 - Moderna risk series) 10/12/2020  Medicare Annual Wellness (AWV)  01/27/2023   DTaP/Tdap/Td (3 - Td or Tdap) 02/14/2033   Pneumonia Vaccine 63+ Years old  Completed   INFLUENZA VACCINE  Completed   Hepatitis C Screening  Completed   Zoster Vaccines- Shingrix  Completed   HPV VACCINES  Aged Out   Colonoscopy  Discontinued    Health Maintenance  Health Maintenance Due  Topic Date Due   COVID-19 Vaccine (3 - Moderna risk series) 10/12/2020   Medicare Annual Wellness (AWV)  01/27/2023    Colorectal cancer screening: No longer required.   Lung Cancer Screening: (Low Dose CT Chest recommended if Age 29-80 years, 20 pack-year currently smoking OR have quit w/in 15years.) does not qualify.   Additional Screening:  Hepatitis C Screening: does qualify; Completed 04/2015  Vision Screening: Recommended annual ophthalmology exams for early detection of glaucoma and other disorders of the eye. Is the patient up  to date with their annual eye exam?  Yes  Who is the provider or what is the name of the office in which the patient attends annual eye exams? [facility] If pt is not established with a provider, would they like to be referred to a provider to establish care? No .   Dental Screening: Recommended annual dental exams for proper oral hygiene    Community Resource Referral / Chronic Care Management: CRR required this visit?  No   CCM required this visit?  No     Plan:     I have personally reviewed and noted the following in the patient's chart:   Medical and social history Use of alcohol, tobacco or illicit drugs  Current medications and supplements including opioid prescriptions. Patient is not currently taking opioid prescriptions. Functional ability and status Nutritional status Physical activity Advanced directives List of other physicians Hospitalizations, surgeries, and ER visits in previous 12 months Vitals Screenings to include cognitive, depression, and falls Referrals and appointments  In addition, I have reviewed and discussed with patient certain preventive protocols, quality metrics, and best practice recommendations. A written personalized care plan for preventive services as well as general preventive health recommendations were provided to patient.     Sydell Axon, LPN   12/25/1189   After Visit Summary: (MyChart) Due to this being a telephonic visit, the after visit summary with patients personalized plan was offered to patient via MyChart   Nurse Notes: None

## 2023-02-22 NOTE — Patient Instructions (Signed)
Mr. Todisco , Thank you for taking time to come for your Medicare Wellness Visit. I appreciate your ongoing commitment to your health goals. Please review the following plan we discussed and let me know if I can assist you in the future.   Referrals/Orders/Follow-Ups/Clinician Recommendations: None  This is a list of the screening recommended for you and due dates:  Health Maintenance  Topic Date Due   COVID-19 Vaccine (3 - Moderna risk series) 10/12/2020   Medicare Annual Wellness Visit  02/22/2024   DTaP/Tdap/Td vaccine (3 - Td or Tdap) 02/14/2033   Pneumonia Vaccine  Completed   Flu Shot  Completed   Hepatitis C Screening  Completed   Zoster (Shingles) Vaccine  Completed   HPV Vaccine  Aged Out   Colon Cancer Screening  Discontinued    Advanced directives: (Copy Requested) Please bring a copy of your health care power of attorney and living will to the office to be added to your chart at your convenience.  Next Medicare Annual Wellness Visit scheduled for next year: Yes 02/28/24 @ 11:15

## 2023-03-05 ENCOUNTER — Other Ambulatory Visit: Payer: Self-pay | Admitting: Family Medicine

## 2023-03-05 DIAGNOSIS — I10 Essential (primary) hypertension: Secondary | ICD-10-CM

## 2023-03-08 ENCOUNTER — Ambulatory Visit: Payer: Medicare HMO | Admitting: Podiatry

## 2023-03-08 DIAGNOSIS — M5417 Radiculopathy, lumbosacral region: Secondary | ICD-10-CM | POA: Diagnosis not present

## 2023-03-08 DIAGNOSIS — M9905 Segmental and somatic dysfunction of pelvic region: Secondary | ICD-10-CM | POA: Diagnosis not present

## 2023-03-08 DIAGNOSIS — M4306 Spondylolysis, lumbar region: Secondary | ICD-10-CM | POA: Diagnosis not present

## 2023-03-08 DIAGNOSIS — M9903 Segmental and somatic dysfunction of lumbar region: Secondary | ICD-10-CM | POA: Diagnosis not present

## 2023-03-28 ENCOUNTER — Encounter: Payer: Self-pay | Admitting: Family Medicine

## 2023-03-28 ENCOUNTER — Ambulatory Visit: Payer: Medicare HMO | Admitting: Podiatry

## 2023-03-28 ENCOUNTER — Ambulatory Visit (INDEPENDENT_AMBULATORY_CARE_PROVIDER_SITE_OTHER): Payer: Medicare HMO | Admitting: Family Medicine

## 2023-03-28 ENCOUNTER — Ambulatory Visit: Payer: Medicare HMO | Admitting: Family Medicine

## 2023-03-28 ENCOUNTER — Encounter: Payer: Self-pay | Admitting: Podiatry

## 2023-03-28 VITALS — BP 118/74 | HR 95 | Temp 98.7°F | Ht 70.0 in | Wt 216.0 lb

## 2023-03-28 VITALS — Ht 70.0 in | Wt 216.0 lb

## 2023-03-28 DIAGNOSIS — E782 Mixed hyperlipidemia: Secondary | ICD-10-CM

## 2023-03-28 DIAGNOSIS — E890 Postprocedural hypothyroidism: Secondary | ICD-10-CM | POA: Diagnosis not present

## 2023-03-28 DIAGNOSIS — M79675 Pain in left toe(s): Secondary | ICD-10-CM

## 2023-03-28 DIAGNOSIS — I872 Venous insufficiency (chronic) (peripheral): Secondary | ICD-10-CM

## 2023-03-28 DIAGNOSIS — Z01818 Encounter for other preprocedural examination: Secondary | ICD-10-CM | POA: Diagnosis not present

## 2023-03-28 DIAGNOSIS — B351 Tinea unguium: Secondary | ICD-10-CM | POA: Diagnosis not present

## 2023-03-28 DIAGNOSIS — R7303 Prediabetes: Secondary | ICD-10-CM

## 2023-03-28 DIAGNOSIS — I10 Essential (primary) hypertension: Secondary | ICD-10-CM

## 2023-03-28 DIAGNOSIS — M79674 Pain in right toe(s): Secondary | ICD-10-CM

## 2023-03-28 DIAGNOSIS — J4530 Mild persistent asthma, uncomplicated: Secondary | ICD-10-CM

## 2023-03-28 LAB — TSH: TSH: 0.45 u[IU]/mL (ref 0.35–5.50)

## 2023-03-28 LAB — CBC
HCT: 50.7 % (ref 39.0–52.0)
Hemoglobin: 16.8 g/dL (ref 13.0–17.0)
MCHC: 33.3 g/dL (ref 30.0–36.0)
MCV: 90.6 fL (ref 78.0–100.0)
Platelets: 282 10*3/uL (ref 150.0–400.0)
RBC: 5.59 Mil/uL (ref 4.22–5.81)
RDW: 14.2 % (ref 11.5–15.5)
WBC: 7.5 10*3/uL (ref 4.0–10.5)

## 2023-03-28 LAB — HEMOGLOBIN A1C: Hgb A1c MFr Bld: 6.3 % (ref 4.6–6.5)

## 2023-03-28 NOTE — Assessment & Plan Note (Signed)
Chronic issue.  Continue Synthroid 100 mcg daily.  Check TSH.

## 2023-03-28 NOTE — Assessment & Plan Note (Signed)
Chronic.  Suspect swelling in his legs is related venous insufficiency.  He will continue compression stockings.  He will monitor.  We will check lab work to ensure nothing else is causing the swelling.

## 2023-03-28 NOTE — Progress Notes (Signed)
Marikay Alar, MD Phone: [phone]  Alexander Fitzgerald is a 77 y.o. male who presents today for follow-up.  Right hip arthroplasty: Patient is scheduled for this on January 7.  He notes no chest pain with 2 flights of stairs.  Does have some stable dyspnea related to his asthma and follows with pulmonology for this.  He has seen them for surgical risk stratification.  He is on Dupixent and Trelegy.  He also sees cardiology tomorrow for risk stratification.  Notes he is no longer on aspirin or Lipitor.  Hypertension: Taking lisinopril and torsemide.  Does note some leg swelling with left and right leg swelling.  Right leg swelling has only been going on over the last several weeks.  Resolves overnight.  No orthopnea or PND.  Patient took an extra torsemide yesterday and this resulted in some weight loss.  Does wear compression stockings.  Social History   Tobacco Use  Smoking Status Former   Current packs/day: 0.00   Average packs/day: 0.5 packs/day for 20.0 years (10.0 ttl pk-yrs)   Types: Cigarettes   Start date: 06/15/1979   Quit date: 06/15/1999   Years since quitting: 23.8  Smokeless Tobacco Never    Current Outpatient Medications on File Prior to Visit  Medication Sig Dispense Refill   albuterol (VENTOLIN HFA) 108 (90 Base) MCG/ACT inhaler INHALE 2 PUFFS 4 TIMES A DAY AS NEEDED 8.5 each 2   Ascorbic Acid (VITAMIN C) 1000 MG tablet Take 1,000 mg by mouth daily. Reported on 06/26/2015     aspirin EC 81 MG tablet Take 1 tablet (81 mg total) by mouth daily. Swallow whole. 90 tablet 3   Cholecalciferol (VITAMIN D3) 2000 units capsule Take 4,000 Units by mouth.     colchicine 0.6 MG tablet TAKE 1 TABLET BY MOUTH EVERY DAY 90 tablet 0   CORLANOR 5 MG TABS tablet Take by mouth.     Cyanocobalamin (VITAMIN B-12) 5000 MCG SUBL Place 2 each under the tongue daily.     cyclobenzaprine (FLEXERIL) 10 MG tablet Take 1 tablet (10 mg total) by mouth 3 (three) times daily as needed for muscle  spasms. 30 tablet 0   Dapsone 5 % topical gel Aczone 5 % topical gel  APPLY A SMALL AMOUNT TO AFFECTED AREA EVERY MORNING     dexlansoprazole (DEXILANT) 60 MG capsule SMARTSIG:1 Pill By Mouth Daily     docusate sodium (COLACE) 100 MG capsule Take 100 mg by mouth daily.     doxycycline (VIBRAMYCIN) 50 MG capsule TAKE 1 CAPSULE BY MOUTH EVERY DAY 90 capsule 2   DUPIXENT 300 MG/2ML SOPN Inject into the skin.     febuxostat (ULORIC) 40 MG tablet Take 1 tablet (40 mg total) by mouth daily. 90 tablet 1   finasteride (PROSCAR) 5 MG tablet TAKE 1 TABLET BY MOUTH EVERY DAY 90 tablet 1   ketoconazole (NIZORAL) 2 % shampoo Shampoo from the waist up let sit 5 minutes then wash off. Use twice per week for 2 mths. Then QM there after. 120 mL 11   levothyroxine (SYNTHROID) 100 MCG tablet TAKE 1 TABLET BY MOUTH DAILY BEFORE BREAKFAST. 90 tablet 1   lisinopril (ZESTRIL) 10 MG tablet Take 1 tablet (10 mg total) by mouth daily. 90 tablet 3   metroNIDAZOLE (METROGEL) 1 % gel      montelukast (SINGULAIR) 10 MG tablet TAKE 1 TABLET BY MOUTH EVERYDAY AT BEDTIME 90 tablet 1   Multiple Vitamin (MULTIVITAMIN) capsule Take 1 capsule by mouth daily.  Pyridoxine HCl 200 MG TBCR Take by mouth.     RYALTRIS 665-25 MCG/ACT SUSP SMARTSIG:1 Spray(s) Both Nares Every 12 Hours PRN     torsemide (DEMADEX) 10 MG tablet TAKE 1 TABLET BY MOUTH EVERY DAY 90 tablet 1   TRELEGY ELLIPTA 200-62.5-25 MCG/ACT AEPB Inhale 1 puff into the lungs daily.     No current facility-administered medications on file prior to visit.     ROS see history of present illness  Objective  Physical Exam Vitals:   03/28/23 1024  BP: 118/74  Pulse: 95  Temp: 98.7 F (37.1 C)  SpO2: 95%    BP Readings from Last 3 Encounters:  03/28/23 118/74  11/25/22 122/79  10/07/22 128/82   Wt Readings from Last 3 Encounters:  03/28/23 216 lb (98 kg)  02/22/23 213 lb (96.6 kg)  05/26/22 213 lb (96.6 kg)    Physical Exam Constitutional:       General: He is not in acute distress.    Appearance: He is not diaphoretic.  Cardiovascular:     Rate and Rhythm: Normal rate and regular rhythm.     Heart sounds: Normal heart sounds.  Pulmonary:     Effort: Pulmonary effort is normal.     Breath sounds: Normal breath sounds.  Musculoskeletal:     Comments: Trace pitting edema bilateral lower extremities  Skin:    General: Skin is warm and dry.  Neurological:     Mental Status: He is alert.      Assessment/Plan: Please see individual problem list.  Primary hypertension Assessment & Plan: Chronic issue.  Adequately controlled.  Patient will continue lisinopril 10 mg daily and torsemide 10 mg daily.  Orders: -     Comprehensive metabolic panel  Venous insufficiency Assessment & Plan: Chronic.  Suspect swelling in his legs is related venous insufficiency.  He will continue compression stockings.  He will monitor.  We will check lab work to ensure nothing else is causing the swelling.   Mild persistent asthma without complication Assessment & Plan: Chronic issue.  Generally improved.  He will continue his Trelegy inhaler and he will continue to see pulmonology for his Dupixent.   Postsurgical hypothyroidism Assessment & Plan: Chronic issue.  Continue Synthroid 100 mcg daily.  Check TSH.  Orders: -     TSH  Pre-diabetes -     Hemoglobin A1c  Hyperlipidemia, mixed -     Lipid panel  Preop examination Assessment & Plan: Preoperative exam completed today.  Lab work will be completed and then we will determine risk stratification.  Per ACS NSQIP risk calculator patient is average risk for any and serious complications.  Patient does not have any modifiable risk factors at this time.  He will have cardiac risk assessment through cardiology as scheduled.  Orders: -     CBC     Return in about 6 months (around 09/26/2023) for Transfer of care.   Marikay Alar, MD [facility]

## 2023-03-28 NOTE — Assessment & Plan Note (Signed)
Chronic issue.  Generally improved.  He will continue his Trelegy inhaler and he will continue to see pulmonology for his Dupixent.

## 2023-03-28 NOTE — Assessment & Plan Note (Signed)
Chronic issue.  Adequately controlled.  Patient will continue lisinopril 10 mg daily and torsemide 10 mg daily.

## 2023-03-28 NOTE — Assessment & Plan Note (Addendum)
Preoperative exam completed today.  Lab work will be completed and then we will determine risk stratification.  Per ACS NSQIP risk calculator patient is average risk for any and serious complications.  Patient does not have any modifiable risk factors at this time.  He will have cardiac risk assessment through cardiology as scheduled.

## 2023-03-29 ENCOUNTER — Encounter: Payer: Self-pay | Admitting: Cardiology

## 2023-03-29 ENCOUNTER — Ambulatory Visit: Payer: Medicare HMO | Attending: Cardiology | Admitting: Cardiology

## 2023-03-29 VITALS — BP 112/74 | HR 81 | Ht 70.0 in | Wt 219.2 lb

## 2023-03-29 DIAGNOSIS — Z01810 Encounter for preprocedural cardiovascular examination: Secondary | ICD-10-CM

## 2023-03-29 DIAGNOSIS — M9905 Segmental and somatic dysfunction of pelvic region: Secondary | ICD-10-CM | POA: Diagnosis not present

## 2023-03-29 DIAGNOSIS — M4306 Spondylolysis, lumbar region: Secondary | ICD-10-CM | POA: Diagnosis not present

## 2023-03-29 DIAGNOSIS — I10 Essential (primary) hypertension: Secondary | ICD-10-CM | POA: Diagnosis not present

## 2023-03-29 DIAGNOSIS — M5417 Radiculopathy, lumbosacral region: Secondary | ICD-10-CM | POA: Diagnosis not present

## 2023-03-29 DIAGNOSIS — I2510 Atherosclerotic heart disease of native coronary artery without angina pectoris: Secondary | ICD-10-CM | POA: Diagnosis not present

## 2023-03-29 DIAGNOSIS — M9903 Segmental and somatic dysfunction of lumbar region: Secondary | ICD-10-CM | POA: Diagnosis not present

## 2023-03-29 LAB — COMPREHENSIVE METABOLIC PANEL
ALT: 45 U/L (ref 0–53)
AST: 36 U/L (ref 0–37)
Albumin: 4.4 g/dL (ref 3.5–5.2)
Alkaline Phosphatase: 72 U/L (ref 39–117)
BUN: 18 mg/dL (ref 6–23)
CO2: 25 meq/L (ref 19–32)
Calcium: 9.9 mg/dL (ref 8.4–10.5)
Chloride: 102 meq/L (ref 96–112)
Creatinine, Ser: 1.13 mg/dL (ref 0.40–1.50)
GFR: 63.08 mL/min (ref >60.00)
Glucose, Bld: 121 mg/dL — ABNORMAL HIGH (ref 70–99)
Potassium: 3.7 meq/L (ref 3.5–5.1)
Sodium: 139 meq/L (ref 135–145)
Total Bilirubin: 1.2 mg/dL (ref 0.2–1.2)
Total Protein: 7.1 g/dL (ref 6.0–8.3)

## 2023-03-29 LAB — LIPID PANEL
Cholesterol: 199 mg/dL (ref 0–200)
HDL: 48.4 mg/dL (ref >39.00)
LDL Cholesterol: 118 mg/dL — ABNORMAL HIGH (ref 0–99)
NonHDL: 150.72
Total CHOL/HDL Ratio: 4
Triglycerides: 162 mg/dL — ABNORMAL HIGH (ref 0.0–149.0)
VLDL: 32.4 mg/dL (ref 0.0–40.0)

## 2023-03-29 MED ORDER — ROSUVASTATIN CALCIUM 10 MG PO TABS: 10.0000 mg | ORAL_TABLET | Freq: Every day | ORAL | 3 refills | Status: DC

## 2023-03-29 NOTE — Progress Notes (Signed)
Cardiology Office Note:    Date:  03/29/2023   ID:  Alexander Fitzgerald, DOB Feb 06, 1946, MRN [id number]  PCP:  Glori Luis, MD   [facility] Providers Cardiologist:  Debbe Odea, MD     Referring MD: Glori Luis, MD   Chief Complaint  Patient presents with   Follow-up    Patient denies new or acute cardiac problems/concerns today.  Needing surgical clearance for hip replacement surgery on 04/26/2023.      History of Present Illness:    Alexander Fitzgerald is a 77 y.o. male with a hx of nonobstructive CAD (mild proximal LAD, minimal distal RCA, Calcium score 200 on CCTA 11/23), hypertension, asthma, former smoker x20+ years who presents for follow-up.    He has right hip pain, diagnosed with osteoarthritis, right hip replacement surgery being planned next month.  He denies chest pain or shortness of breath.  Had a history of elevated LFTs, held aspirin.  At the time, was also taking allopurinol and several other medicines which might have increase his LFTs.  Last hepatic panel checked showed normalized levels.   Prior notes CCTA 11/23 mild proximal LAD, minimal distal RCA, Calcium score 200 Calcium score 2017- 106  Past Medical History:  Diagnosis Date   Asthma    adult onset, PFT[place] done in [place]   Basal cell carcinoma 11/25/2022   Left Parietal Scalp, [facility]   Cancer (HCC)    Several moles removed in past, all pre-cancerous.  Recently followed by Dr. Gwen Pounds   Depression    Treated with Prozac in 1992,  Associate with job loss   Esophageal reflux    Gout    Hiatal hernia    Hypertension    Squamous cell carcinoma of skin 07/30/2015   R anterior lat neck, Atypical squamous proliferation, excised 08/28/2015    Past Surgical History:  Procedure Laterality Date   APPENDECTOMY     COLONOSCOPY N/A 12/27/2014   Procedure: COLONOSCOPY;  Surgeon: Christena Deem, MD;  Location: [facility] ENDOSCOPY;  Service: Endoscopy;  Laterality: N/A;   LAPAROSCOPIC  PARAESOPHAGEAL HERNIA REPAIR     TONSILLECTOMY      Current Medications: Current Meds  Medication Sig   albuterol (VENTOLIN HFA) 108 (90 Base) MCG/ACT inhaler INHALE 2 PUFFS 4 TIMES A DAY AS NEEDED   Ascorbic Acid (VITAMIN C) 1000 MG tablet Take 1,000 mg by mouth daily. Reported on 06/26/2015   Cholecalciferol (VITAMIN D3) 2000 units capsule Take 4,000 Units by mouth.   colchicine 0.6 MG tablet TAKE 1 TABLET BY MOUTH EVERY DAY   CORLANOR 5 MG TABS tablet Take by mouth.   Cyanocobalamin (VITAMIN B-12) 5000 MCG SUBL Place 2 each under the tongue daily.   cyclobenzaprine (FLEXERIL) 10 MG tablet Take 1 tablet (10 mg total) by mouth 3 (three) times daily as needed for muscle spasms.   Dapsone 5 % topical gel Aczone 5 % topical gel  APPLY A SMALL AMOUNT TO AFFECTED AREA EVERY MORNING   dexlansoprazole (DEXILANT) 60 MG capsule SMARTSIG:1 Pill By Mouth Daily   docusate sodium (COLACE) 100 MG capsule Take 100 mg by mouth daily.   doxycycline (VIBRAMYCIN) 50 MG capsule TAKE 1 CAPSULE BY MOUTH EVERY DAY   DUPIXENT 300 MG/2ML SOPN Inject into the skin.   febuxostat (ULORIC) 40 MG tablet Take 1 tablet (40 mg total) by mouth daily.   finasteride (PROSCAR) 5 MG tablet TAKE 1 TABLET BY MOUTH EVERY DAY   ketoconazole (NIZORAL) 2 % shampoo Shampoo  from the waist up let sit 5 minutes then wash off. Use twice per week for 2 mths. Then QM there after.   levothyroxine (SYNTHROID) 100 MCG tablet TAKE 1 TABLET BY MOUTH DAILY BEFORE BREAKFAST.   lisinopril (ZESTRIL) 10 MG tablet Take 1 tablet (10 mg total) by mouth daily.   metroNIDAZOLE (METROGEL) 1 % gel    montelukast (SINGULAIR) 10 MG tablet TAKE 1 TABLET BY MOUTH EVERYDAY AT BEDTIME   Multiple Vitamin (MULTIVITAMIN) capsule Take 1 capsule by mouth daily.   Pyridoxine HCl 200 MG TBCR Take by mouth.   rosuvastatin (CRESTOR) 10 MG tablet Take 1 tablet (10 mg total) by mouth daily.   RYALTRIS 665-25 MCG/ACT SUSP SMARTSIG:1 Spray(s) Both Nares Every 12 Hours PRN    torsemide (DEMADEX) 10 MG tablet TAKE 1 TABLET BY MOUTH EVERY DAY   TRELEGY ELLIPTA 200-62.5-25 MCG/ACT AEPB Inhale 1 puff into the lungs daily.     Allergies:   Sulfa drugs cross reactors   Social History   Socioeconomic History   Marital status: Married    Spouse name: Not on file   Number of children: Not on file   Years of education: Not on file   Highest education level: Master's degree (e.g., MA, MS, MEng, MEd, MSW, MBA)  Occupational History   Not on file  Tobacco Use   Smoking status: Former    Current packs/day: 0.00    Average packs/day: 0.5 packs/day for 20.0 years (10.0 ttl pk-yrs)    Types: Cigarettes    Start date: 06/15/1979    Quit date: 06/15/1999    Years since quitting: 23.8   Smokeless tobacco: Never  Substance and Sexual Activity   Alcohol use: Not Currently    Alcohol/week: 1.0 standard drink of alcohol    Types: 1 Standard drinks or equivalent per week    Comment: rarely   Drug use: No   Sexual activity: Yes  Other Topics Concern   Not on file  Social History Narrative   married   Social Determinants of Health   Financial Resource Strain: Low Risk  (03/25/2023)   Overall Financial Resource Strain (CARDIA)    Difficulty of Paying Living Expenses: Not hard at all  Food Insecurity: No Food Insecurity (03/25/2023)   Hunger Vital Sign    Worried About Running Out of Food in the Last Year: Never true    Ran Out of Food in the Last Year: Never true  Transportation Needs: No Transportation Needs (03/25/2023)   PRAPARE - Administrator, Civil Service (Medical): No    Lack of Transportation (Non-Medical): No  Physical Activity: Sufficiently Active (03/25/2023)   Exercise Vital Sign    Days of Exercise per Week: 7 days    Minutes of Exercise per Session: 60 min  Stress: No Stress Concern Present (03/25/2023)   [employer] of Occupational Health - Occupational Stress Questionnaire    Feeling of Stress : Not at all  Social Connections:  Socially Integrated (03/25/2023)   Social Connection and Isolation Panel [NHANES]    Frequency of Communication with Friends and Family: More than three times a week    Frequency of Social Gatherings with Friends and Family: Twice a week    Attends Religious Services: More than 4 times per year    Active Member of [employer] or Organizations: Yes    Attends Engineer, structural: More than 4 times per year    Marital Status: Married     Family History: The  patient's family history includes Cancer in his sister; Diabetes in his mother; Heart attack (age of onset: 2) in his father; Hyperlipidemia in his mother; Hypertension in his father and mother.  ROS:   Please see the history of present illness.     All other systems reviewed and are negative.  EKGs/Labs/Other Studies Reviewed:    The following studies were reviewed today:   EKG Interpretation Date/Time:  Tuesday March 29 2023 09:34:56 EST Ventricular Rate:  81 PR Interval:  146 QRS Duration:  142 QT Interval:  412 QTC Calculation: 478 R Axis:   62  Text Interpretation: Normal sinus rhythm Right bundle branch block Confirmed by Debbe Odea ([id number]) on 03/29/2023 9:37:55 AM    Recent Labs: 05/26/2022: TSH 0.81 09/28/2022: ALT 45; Hemoglobin 16.7; Platelets 229.0 10/27/2022: BUN 25; Creatinine, Ser 1.08; Potassium 4.0; Sodium 141  Recent Lipid Panel    Component Value Date/Time   CHOL 181 12/29/2020 1028   CHOL 142 03/01/2016 0929   TRIG 178.0 (H) 12/29/2020 1028   HDL 46.20 12/29/2020 1028   HDL 49 03/01/2016 0929   CHOLHDL 4 12/29/2020 1028   VLDL 35.6 12/29/2020 1028   LDLCALC 99 12/29/2020 1028   LDLCALC 70 03/01/2016 0929     Risk Assessment/Calculations:               Physical Exam:    VS:  BP 112/74 (BP Location: Left Arm, Patient Position: Sitting, Cuff Size: Large)   Pulse 81   Ht 5\' 10"  (1.778 m)   Wt 219 lb 3.2 oz (99.4 kg)   SpO2 95%   BMI 31.45 kg/m     Wt Readings from Last 3  Encounters:  03/29/23 219 lb 3.2 oz (99.4 kg)  03/28/23 216 lb (98 kg)  03/28/23 216 lb (98 kg)     GEN:  Well nourished, well developed in no acute distress HEENT: Normal NECK: No JVD; No carotid bruits CARDIAC: RRR, no murmurs, rubs, gallops RESPIRATORY:  Clear to auscultation without rales, wheezing or rhonchi  ABDOMEN: Soft, non-tender, non-distended MUSCULOSKELETAL:  No edema; No deformity  SKIN: Warm and dry NEUROLOGIC:  Alert and oriented x 3 PSYCHIATRIC:  Normal affect   ASSESSMENT:    1. Pre-procedural cardiovascular examination   2. Coronary artery disease without angina pectoris, unspecified vessel or lesion type, unspecified whether native or transplanted heart   3. Primary hypertension    PLAN:    In order of problems listed above:  Preprocedural cardiovascular exam, right hip surgery being planned.  Last echo with normal EF, coronary CT with mild nonobstructive disease.  Patient asymptomatic from a cardiac perspective.  Okay to proceed with surgical procedure.  No indication for additional cardiac testing.  Okay to hold aspirin.  Restart aspirin when safe after procedure. Nonobstructive CAD, mild proximal LAD, minimal distal RCA, Calcium score 200 on CCTA 11/23.  EF 55 to 60%.  Continue aspirin 81 mg daily, start Crestor 10 mg daily.  Check lipid panel in 3 months.  Check hepatic panel in 3 months. Hypertension, BP controlled, continue lisinopril 10 mg daily.  Follow-up in 1 year.    Medication Adjustments/Labs and Tests Ordered: Current medicines are reviewed at length with the patient today.  Concerns regarding medicines are outlined above.  Orders Placed This Encounter  Procedures   Lipid panel   Hepatic function panel   EKG 12-Lead   Meds ordered this encounter  Medications   rosuvastatin (CRESTOR) 10 MG tablet    Sig: Take 1  tablet (10 mg total) by mouth daily.    Dispense:  90 tablet    Refill:  3    Patient Instructions  Medication Instructions:     START Rosuvastatin - Take one tablet (10mg ) by mouth daily.   *If you need a refill on your cardiac medications before your next appointment, please call your pharmacy*   Lab Work:  Your provider would like for you to return in 3 months ( in march) to have the following labs drawn: LIPID / LFT.   Please go to [facility] [address] ([facility] [address], [place] [address] You do not need an appointment.  They are open from 7:30 am-4 pm.  Lunch from 1:00 pm- 2:00 pm You WILL need to be fasting.    If you have labs (blood work) drawn today and your tests are completely normal, you will receive your results only by: MyChart Message (if you have MyChart) OR A paper copy in the mail If you have any lab test that is abnormal or we need to change your treatment, we will call you to review the results.   Testing/Procedures:  None Ordered    Follow-Up: At [facility], you and your health needs are our priority.  As part of our continuing mission to provide you with exceptional heart care, we have created designated Provider Care Teams.  These Care Teams include your primary Cardiologist (physician) and Advanced Practice Providers (APPs -  Physician Assistants and Nurse Practitioners) who all work together to provide you with the care you need, when you need it.  We recommend signing up for the patient portal called "MyChart".  Sign up information is provided on this After Visit Summary.  MyChart is used to connect with patients for Virtual Visits (Telemedicine).  Patients are able to view lab/test results, encounter notes, upcoming appointments, etc.  Non-urgent messages can be sent to your provider as well.   To learn more about what you can do with MyChart, go to [email or web address].    Your next appointment:   12 month(s)  Provider:   You may see Debbe Odea, MD or one of the following Advanced Practice Providers on your designated  Care Team:   Nicolasa Ducking, NP Eula Listen, PA-C Cadence Fransico Michael, PA-C Charlsie Quest, NP Carlos Levering, NP    Signed, Debbe Odea, MD  03/29/2023 10:19 AM    [facility]

## 2023-03-29 NOTE — Patient Instructions (Addendum)
Medication Instructions:    START Rosuvastatin - Take one tablet (10mg ) by mouth daily.   *If you need a refill on your cardiac medications before your next appointment, please call your pharmacy*   Lab Work:  Your provider would like for you to return in 3 months ( in march) to have the following labs drawn: LIPID / LFT.   Please go to [facility] [address] ([facility] [address], [place] [address] You do not need an appointment.  They are open from 7:30 am-4 pm.  Lunch from 1:00 pm- 2:00 pm You WILL need to be fasting.    If you have labs (blood work) drawn today and your tests are completely normal, you will receive your results only by: MyChart Message (if you have MyChart) OR A paper copy in the mail If you have any lab test that is abnormal or we need to change your treatment, we will call you to review the results.   Testing/Procedures:  None Ordered    Follow-Up: At [facility], you and your health needs are our priority.  As part of our continuing mission to provide you with exceptional heart care, we have created designated Provider Care Teams.  These Care Teams include your primary Cardiologist (physician) and Advanced Practice Providers (APPs -  Physician Assistants and Nurse Practitioners) who all work together to provide you with the care you need, when you need it.  We recommend signing up for the patient portal called "MyChart".  Sign up information is provided on this After Visit Summary.  MyChart is used to connect with patients for Virtual Visits (Telemedicine).  Patients are able to view lab/test results, encounter notes, upcoming appointments, etc.  Non-urgent messages can be sent to your provider as well.   To learn more about what you can do with MyChart, go to [email or web address].    Your next appointment:   12 month(s)  Provider:   You may see Debbe Odea, MD or one of the following Advanced Practice  Providers on your designated Care Team:   Nicolasa Ducking, NP Eula Listen, PA-C Cadence Fransico Michael, PA-C Charlsie Quest, NP Carlos Levering, NP

## 2023-04-01 ENCOUNTER — Telehealth: Payer: Self-pay | Admitting: Family Medicine

## 2023-04-01 ENCOUNTER — Encounter: Payer: Self-pay | Admitting: Family Medicine

## 2023-04-01 NOTE — Telephone Encounter (Signed)
Surgical risk assessment form signed.  Placed and signed folder.  Please attach labs from his recent visit and fax to surgeon's office.  Thanks.

## 2023-04-01 NOTE — Progress Notes (Signed)
  Subjective:  Patient ID: Alexander Fitzgerald, male    DOB: [birth year],  MRN: [id number]  77 y.o. male presents painful elongated mycotic toenails 1-5 bilaterally which are tender when wearing enclosed shoe gear. Pain is relieved with periodic professional debridement. Patient states he had ingrown toenail removed from right 3rd digit by Dr. Allena Katz and it feels better. Chief Complaint  Patient presents with   Nail Problem    Pt is here for RFC not a diabetic PCP is Dr Birdie Sons and LOV was today.   New problem(s): None.  Allergies  Allergen Reactions   Sulfa Drugs Cross Reactors     Blood in urine    Review of Systems: Negative except as noted in the HPI.   Objective:  DOMINYC PEARSON is a pleasant 77 y.o. male in NAD. AAO x 3.  Vascular Examination: Vascular status intact b/l with palpable pedal pulses. CFT immediate b/l. Pedal hair present. No edema. No pain with calf compression b/l. Skin temperature gradient WNL b/l. No varicosities noted. No cyanosis or clubbing noted.  Neurological Examination: Sensation grossly intact b/l with 10 gram monofilament. Vibratory sensation intact b/l.  Dermatological Examination: Pedal skin with normal turgor, texture and tone b/l. No open wounds nor interdigital macerations noted. Toenails 1-5 b/l thick, discolored, elongated with subungual debris and pain on dorsal palpation. Evidence of partial matrixectomy R 3rd toe. No hyperkeratotic lesions noted b/l.   Musculoskeletal Examination: Muscle strength 5/5 to b/l LE.  No pain, crepitus noted b/l. No gross pedal deformities. Patient ambulates independently without assistive aids.   Radiographs: None  Last A1c:      Latest Ref Rng & Units 03/28/2023   11:01 AM  Hemoglobin A1C  Hemoglobin-A1c 4.6 - 6.5 % 6.3      Assessment:   1. Pain due to onychomycosis of toenails of both feet    Plan:  -Patient was evaluated today. All questions/concerns addressed on today's visit. -Patient to  continue soft, supportive shoe gear daily. -Toenails 1-5 b/l were debrided in length and girth with sterile nail nippers and dremel without iatrogenic bleeding.  -Patient/POA to call should there be question/concern in the interim.  Return in about 9 weeks (around 05/30/2023).  Freddie Breech, DPM      [place] LOCATION: [address], [place] [address]                   Office [phone]   [facility] LOCATION: [address] [place], [place] [address] Office [phone]

## 2023-04-04 DIAGNOSIS — Z01818 Encounter for other preprocedural examination: Secondary | ICD-10-CM | POA: Diagnosis not present

## 2023-04-04 DIAGNOSIS — M25551 Pain in right hip: Secondary | ICD-10-CM | POA: Diagnosis not present

## 2023-04-04 DIAGNOSIS — M1611 Unilateral primary osteoarthritis, right hip: Secondary | ICD-10-CM | POA: Diagnosis not present

## 2023-04-04 NOTE — Telephone Encounter (Signed)
Faxed to [facility] at [phone] and received a okay confirmation.

## 2023-04-08 ENCOUNTER — Other Ambulatory Visit: Payer: Self-pay | Admitting: Family Medicine

## 2023-04-08 DIAGNOSIS — I10 Essential (primary) hypertension: Secondary | ICD-10-CM

## 2023-04-14 DIAGNOSIS — G4733 Obstructive sleep apnea (adult) (pediatric): Secondary | ICD-10-CM | POA: Diagnosis not present

## 2023-04-17 ENCOUNTER — Other Ambulatory Visit: Payer: Self-pay | Admitting: Internal Medicine

## 2023-04-19 DIAGNOSIS — M9903 Segmental and somatic dysfunction of lumbar region: Secondary | ICD-10-CM | POA: Diagnosis not present

## 2023-04-19 DIAGNOSIS — M4306 Spondylolysis, lumbar region: Secondary | ICD-10-CM | POA: Diagnosis not present

## 2023-04-19 DIAGNOSIS — M5417 Radiculopathy, lumbosacral region: Secondary | ICD-10-CM | POA: Diagnosis not present

## 2023-04-19 DIAGNOSIS — M9905 Segmental and somatic dysfunction of pelvic region: Secondary | ICD-10-CM | POA: Diagnosis not present

## 2023-04-20 HISTORY — PX: TOTAL HIP ARTHROPLASTY: SHX124

## 2023-04-24 ENCOUNTER — Other Ambulatory Visit: Payer: Self-pay | Admitting: Family Medicine

## 2023-04-24 DIAGNOSIS — M1A0710 Idiopathic chronic gout, right ankle and foot, without tophus (tophi): Secondary | ICD-10-CM

## 2023-04-25 DIAGNOSIS — M1611 Unilateral primary osteoarthritis, right hip: Secondary | ICD-10-CM | POA: Insufficient documentation

## 2023-04-26 DIAGNOSIS — Z4789 Encounter for other orthopedic aftercare: Secondary | ICD-10-CM | POA: Diagnosis not present

## 2023-04-26 DIAGNOSIS — M1611 Unilateral primary osteoarthritis, right hip: Secondary | ICD-10-CM | POA: Diagnosis not present

## 2023-04-26 DIAGNOSIS — I10 Essential (primary) hypertension: Secondary | ICD-10-CM | POA: Diagnosis not present

## 2023-04-26 DIAGNOSIS — I2510 Atherosclerotic heart disease of native coronary artery without angina pectoris: Secondary | ICD-10-CM | POA: Diagnosis not present

## 2023-04-26 DIAGNOSIS — Z87891 Personal history of nicotine dependence: Secondary | ICD-10-CM | POA: Diagnosis not present

## 2023-04-26 DIAGNOSIS — Z96641 Presence of right artificial hip joint: Secondary | ICD-10-CM | POA: Diagnosis not present

## 2023-04-27 DIAGNOSIS — M1611 Unilateral primary osteoarthritis, right hip: Secondary | ICD-10-CM | POA: Diagnosis not present

## 2023-04-29 DIAGNOSIS — M25551 Pain in right hip: Secondary | ICD-10-CM | POA: Diagnosis not present

## 2023-04-29 DIAGNOSIS — M25651 Stiffness of right hip, not elsewhere classified: Secondary | ICD-10-CM | POA: Diagnosis not present

## 2023-05-03 DIAGNOSIS — M25551 Pain in right hip: Secondary | ICD-10-CM | POA: Diagnosis not present

## 2023-05-03 DIAGNOSIS — M25651 Stiffness of right hip, not elsewhere classified: Secondary | ICD-10-CM | POA: Diagnosis not present

## 2023-05-06 DIAGNOSIS — M25651 Stiffness of right hip, not elsewhere classified: Secondary | ICD-10-CM | POA: Diagnosis not present

## 2023-05-06 DIAGNOSIS — M25551 Pain in right hip: Secondary | ICD-10-CM | POA: Diagnosis not present

## 2023-05-09 DIAGNOSIS — M25551 Pain in right hip: Secondary | ICD-10-CM | POA: Diagnosis not present

## 2023-05-09 DIAGNOSIS — M25651 Stiffness of right hip, not elsewhere classified: Secondary | ICD-10-CM | POA: Diagnosis not present

## 2023-05-11 DIAGNOSIS — M25651 Stiffness of right hip, not elsewhere classified: Secondary | ICD-10-CM | POA: Diagnosis not present

## 2023-05-11 DIAGNOSIS — M25551 Pain in right hip: Secondary | ICD-10-CM | POA: Diagnosis not present

## 2023-05-13 ENCOUNTER — Other Ambulatory Visit: Payer: Self-pay | Admitting: Family Medicine

## 2023-05-18 DIAGNOSIS — M25651 Stiffness of right hip, not elsewhere classified: Secondary | ICD-10-CM | POA: Diagnosis not present

## 2023-05-18 DIAGNOSIS — M25551 Pain in right hip: Secondary | ICD-10-CM | POA: Diagnosis not present

## 2023-05-20 DIAGNOSIS — M25551 Pain in right hip: Secondary | ICD-10-CM | POA: Diagnosis not present

## 2023-05-20 DIAGNOSIS — M25651 Stiffness of right hip, not elsewhere classified: Secondary | ICD-10-CM | POA: Diagnosis not present

## 2023-05-23 DIAGNOSIS — M25651 Stiffness of right hip, not elsewhere classified: Secondary | ICD-10-CM | POA: Diagnosis not present

## 2023-05-23 DIAGNOSIS — M25551 Pain in right hip: Secondary | ICD-10-CM | POA: Diagnosis not present

## 2023-05-24 ENCOUNTER — Other Ambulatory Visit: Payer: Self-pay | Admitting: Family Medicine

## 2023-05-24 DIAGNOSIS — M1A9XX0 Chronic gout, unspecified, without tophus (tophi): Secondary | ICD-10-CM

## 2023-05-25 ENCOUNTER — Telehealth: Payer: Self-pay | Admitting: Family Medicine

## 2023-05-25 NOTE — Telephone Encounter (Signed)
 The patient know I got a fax from [facility] that noted his levothyroxine  was part of a recent recall.  He needs to contact his pharmacy to get a replacement supply.

## 2023-05-26 NOTE — Telephone Encounter (Signed)
 Lvm for pt to give office a call back. Okay to relay message. If message is relayed please notify the office. Thx

## 2023-05-27 DIAGNOSIS — M25651 Stiffness of right hip, not elsewhere classified: Secondary | ICD-10-CM | POA: Diagnosis not present

## 2023-05-27 DIAGNOSIS — M25551 Pain in right hip: Secondary | ICD-10-CM | POA: Diagnosis not present

## 2023-05-27 NOTE — Telephone Encounter (Signed)
 LMTCB

## 2023-05-30 DIAGNOSIS — M25651 Stiffness of right hip, not elsewhere classified: Secondary | ICD-10-CM | POA: Diagnosis not present

## 2023-05-30 DIAGNOSIS — M25551 Pain in right hip: Secondary | ICD-10-CM | POA: Diagnosis not present

## 2023-06-01 DIAGNOSIS — M25551 Pain in right hip: Secondary | ICD-10-CM | POA: Diagnosis not present

## 2023-06-01 DIAGNOSIS — M25651 Stiffness of right hip, not elsewhere classified: Secondary | ICD-10-CM | POA: Diagnosis not present

## 2023-06-08 DIAGNOSIS — Z471 Aftercare following joint replacement surgery: Secondary | ICD-10-CM | POA: Diagnosis not present

## 2023-06-08 DIAGNOSIS — Z96641 Presence of right artificial hip joint: Secondary | ICD-10-CM | POA: Diagnosis not present

## 2023-06-13 ENCOUNTER — Encounter: Payer: Self-pay | Admitting: Podiatry

## 2023-06-13 ENCOUNTER — Ambulatory Visit: Payer: Medicare HMO | Admitting: Podiatry

## 2023-06-13 VITALS — Ht 70.0 in | Wt 219.2 lb

## 2023-06-13 DIAGNOSIS — M79675 Pain in left toe(s): Secondary | ICD-10-CM | POA: Diagnosis not present

## 2023-06-13 DIAGNOSIS — M79674 Pain in right toe(s): Secondary | ICD-10-CM

## 2023-06-13 DIAGNOSIS — B351 Tinea unguium: Secondary | ICD-10-CM

## 2023-06-14 ENCOUNTER — Encounter: Payer: Self-pay | Admitting: Cardiology

## 2023-06-15 NOTE — Progress Notes (Signed)
  Subjective:  Patient ID: Alexander Fitzgerald, male    DOB: [birth year],  MRN: [id number]  78 y.o. male presents to clinic with  painful, elongated thickened toenails x 10 which are symptomatic when wearing enclosed shoe gear. This interferes with his/her daily activities. Patient states he is recovering from hip surgery. Chief Complaint  Patient presents with   Nail Problem    [facility] is Dr Lemont Fillers and LOV was in December.     New problem(s): None   PCP is Glori Luis, MD.  Allergies  Allergen Reactions   Sulfa Drugs Cross Reactors     Blood in urine    Review of Systems: Negative except as noted in the HPI.   Objective:  Alexander Fitzgerald is a pleasant 78 y.o. male WD, WN in NAD. AAO x 3.  Vascular Examination: Vascular status intact b/l with palpable pedal pulses. CFT immediate b/l. No edema. No pain with calf compression b/l. Skin temperature gradient WNL b/l. No ischemia or gangrene noted b/l LE. No cyanosis or clubbing noted b/l LE.  Neurological Examination: Sensation grossly intact b/l with 10 gram monofilament. Vibratory sensation intact b/l.   Dermatological Examination: Pedal skin with normal turgor, texture and tone b/l. Toenails 1-5 b/l thick, discolored, elongated with subungual debris and pain on dorsal palpation. No corns, calluses nor porokeratotic lesions noted.  Musculoskeletal Examination: Muscle strength 5/5 to b/l LE. No pain, crepitus or joint limitation noted with ROM bilateral LE. Utilizes cane for ambulation assistance.  Radiographs: None  Last A1c:      Latest Ref Rng & Units 03/28/2023   11:01 AM  Hemoglobin A1C  Hemoglobin-A1c 4.6 - 6.5 % 6.3      Assessment:   1. Pain due to onychomycosis of toenails of both feet    Plan:  Patient was evaluated and treated. All patient's and/or POA's questions/concerns addressed on today's visit. Mycotic toenails 1-5 debrided in length and girth without incident. Continue soft,  supportive shoe gear daily. Report any pedal injuries to medical professional. Call office if there are any quesitons/concerns. -Patient/POA to call should there be question/concern in the interim.  Return in about 9 weeks (around 08/15/2023).  Freddie Breech, DPM      [place] LOCATION: [address] [place] [address]                   Office [phone]   [facility] LOCATION: [address] [place], [place] [address] Office [phone]

## 2023-06-17 ENCOUNTER — Other Ambulatory Visit: Payer: Self-pay | Admitting: Emergency Medicine

## 2023-06-17 DIAGNOSIS — Z79899 Other long term (current) drug therapy: Secondary | ICD-10-CM

## 2023-06-20 ENCOUNTER — Ambulatory Visit: Payer: Medicare HMO | Admitting: Dermatology

## 2023-06-20 DIAGNOSIS — D692 Other nonthrombocytopenic purpura: Secondary | ICD-10-CM | POA: Diagnosis not present

## 2023-06-20 DIAGNOSIS — L820 Inflamed seborrheic keratosis: Secondary | ICD-10-CM | POA: Diagnosis not present

## 2023-06-20 DIAGNOSIS — L821 Other seborrheic keratosis: Secondary | ICD-10-CM | POA: Diagnosis not present

## 2023-06-20 DIAGNOSIS — L570 Actinic keratosis: Secondary | ICD-10-CM | POA: Diagnosis not present

## 2023-06-20 DIAGNOSIS — L814 Other melanin hyperpigmentation: Secondary | ICD-10-CM | POA: Diagnosis not present

## 2023-06-20 DIAGNOSIS — W908XXA Exposure to other nonionizing radiation, initial encounter: Secondary | ICD-10-CM | POA: Diagnosis not present

## 2023-06-20 DIAGNOSIS — Z85828 Personal history of other malignant neoplasm of skin: Secondary | ICD-10-CM | POA: Diagnosis not present

## 2023-06-20 DIAGNOSIS — Z8589 Personal history of malignant neoplasm of other organs and systems: Secondary | ICD-10-CM

## 2023-06-20 DIAGNOSIS — L578 Other skin changes due to chronic exposure to nonionizing radiation: Secondary | ICD-10-CM | POA: Diagnosis not present

## 2023-06-20 NOTE — Progress Notes (Unsigned)
 Follow-Up Visit   Subjective  Alexander Fitzgerald is a 78 y.o. male who presents for the following: Recheck Aks on sun exposed areas, and previously treated BCC on the L parietal scalp.   The patient has spots, moles and lesions to be evaluated, some may be new or changing and the patient may have concern these could be cancer.  The following portions of the chart were reviewed this encounter and updated as appropriate: medications, allergies, medical history  Review of Systems:  No other skin or systemic complaints except as noted in HPI or Assessment and Plan.  Objective  Well appearing patient in no apparent distress; mood and affect are within normal limits.   A focused examination was performed of the following areas: the face scalp, ears, arms, L thigh, and hands.   Relevant exam findings are noted in the Assessment and Plan.  Scalp, face x 7, arms x 5 (12) Erythematous thin papules/macules with gritty scale.  R post neck x 1, L ant scalp x 1 (2) Erythematous stuck-on, waxy papule or plaque  Assessment & Plan   SEBORRHEIC KERATOSIS - Stuck-on, waxy, tan-brown papules and/or plaques  - Benign-appearing - Discussed benign etiology and prognosis. - Observe - Call for any changes  ACTINIC DAMAGE - chronic, secondary to cumulative UV radiation exposure/sun exposure over time - diffuse scaly erythematous macules with underlying dyspigmentation - Recommend daily broad spectrum sunscreen SPF 30+ to sun-exposed areas, reapply every 2 hours as needed.  - Recommend staying in the shade or wearing long sleeves, sun glasses (UVA+UVB protection) and wide brim hats (4-inch brim around the entire circumference of the hat). - Call for new or changing lesions.  LENTIGINES Exam: scattered tan macules Due to sun exposure Treatment Plan: Benign-appearing, observe. Recommend daily broad spectrum sunscreen SPF 30+ to sun-exposed areas, reapply every 2 hours as needed.  Call for any  changes AK (ACTINIC KERATOSIS) (12) Scalp, face x 7, arms x 5 (12) Actinic keratoses are precancerous spots that appear secondary to cumulative UV radiation exposure/sun exposure over time. They are chronic with expected duration over 1 year. A portion of actinic keratoses will progress to squamous cell carcinoma of the skin. It is not possible to reliably predict which spots will progress to skin cancer and so treatment is recommended to prevent development of skin cancer.  Recommend daily broad spectrum sunscreen SPF 30+ to sun-exposed areas, reapply every 2 hours as needed.  Recommend staying in the shade or wearing long sleeves, sun glasses (UVA+UVB protection) and wide brim hats (4-inch brim around the entire circumference of the hat). Call for new or changing lesions.  Destruction of lesion - Scalp, face x 7, arms x 5 (12) Complexity: simple   Destruction method: cryotherapy   Informed consent: discussed and consent obtained   Timeout:  patient name, date of birth, surgical site, and procedure verified Lesion destroyed using liquid nitrogen: Yes   Region frozen until ice ball extended beyond lesion: Yes   Outcome: patient tolerated procedure well with no complications   Post-procedure details: wound care instructions given   INFLAMED SEBORRHEIC KERATOSIS (2) R post neck x 1, L ant scalp x 1 (2) Symptomatic, irritating, patient would like treated.  Destruction of lesion - R post neck x 1, L ant scalp x 1 (2) Complexity: simple   Destruction method: cryotherapy   Informed consent: discussed and consent obtained   Timeout:  patient name, date of birth, surgical site, and procedure verified Lesion destroyed using liquid  nitrogen: Yes   Region frozen until ice ball extended beyond lesion: Yes   Outcome: patient tolerated procedure well with no complications   Post-procedure details: wound care instructions given    HISTORY OF BASAL CELL CARCINOMA OF THE SKIN - No evidence of  recurrence today - Recommend regular full body skin exams - Recommend daily broad spectrum sunscreen SPF 30+ to sun-exposed areas, reapply every 2 hours as needed.  - Call if any new or changing lesions are noted between office visits  Purpura - Chronic; persistent and recurrent.  Treatable, but not curable. - Violaceous macules and patches - Benign - Related to trauma, age, sun damage and/or use of blood thinners, chronic use of topical and/or oral steroids - Observe - Can use OTC arnica containing moisturizer such as Dermend Bruise Formula if desired - Call for worsening or other concerns  HISTORY OF SQUAMOUS CELL CARCINOMA OF THE SKIN - No evidence of recurrence today - No lymphadenopathy - Recommend regular full body skin exams - Recommend daily broad spectrum sunscreen SPF 30+ to sun-exposed areas, reapply every 2 hours as needed.  - Call if any new or changing lesions are noted between office visits  Return in about 8 months (around 02/20/2024) for UBSE - hx AK, BCC.  Maylene Roes, CMA, am acting as scribe for [name], MD .  Documentation: I have reviewed the above documentation for accuracy and completeness, and I agree with the above.  [name], MD

## 2023-06-20 NOTE — Patient Instructions (Signed)

## 2023-06-21 ENCOUNTER — Encounter: Payer: Self-pay | Admitting: Dermatology

## 2023-06-23 ENCOUNTER — Ambulatory Visit: Payer: Medicare HMO | Admitting: Dermatology

## 2023-06-27 ENCOUNTER — Other Ambulatory Visit: Payer: Self-pay | Admitting: Emergency Medicine

## 2023-06-27 DIAGNOSIS — Z01810 Encounter for preprocedural cardiovascular examination: Secondary | ICD-10-CM

## 2023-06-27 DIAGNOSIS — Z79899 Other long term (current) drug therapy: Secondary | ICD-10-CM

## 2023-06-27 DIAGNOSIS — I2510 Atherosclerotic heart disease of native coronary artery without angina pectoris: Secondary | ICD-10-CM

## 2023-06-28 ENCOUNTER — Encounter: Payer: Self-pay | Admitting: Family Medicine

## 2023-06-28 LAB — LIPID PANEL
Chol/HDL Ratio: 2.6 ratio (ref 0.0–5.0)
Cholesterol, Total: 120 mg/dL (ref 100–199)
HDL: 46 mg/dL (ref >39)
LDL Chol Calc (NIH): 44 mg/dL (ref 0–99)
Triglycerides: 187 mg/dL — ABNORMAL HIGH (ref 0–149)
VLDL Cholesterol Cal: 30 mg/dL (ref 5–40)

## 2023-06-28 LAB — COMPREHENSIVE METABOLIC PANEL
ALT: 29 IU/L (ref 0–44)
AST: 31 IU/L (ref 0–40)
Albumin: 4 g/dL (ref 3.8–4.8)
Alkaline Phosphatase: 90 IU/L (ref 44–121)
BUN/Creatinine Ratio: 15 (ref 10–24)
BUN: 16 mg/dL (ref 8–27)
Bilirubin Total: 0.6 mg/dL (ref 0.0–1.2)
CO2: 24 mmol/L (ref 20–29)
Calcium: 9.3 mg/dL (ref 8.6–10.2)
Chloride: 106 mmol/L (ref 96–106)
Creatinine, Ser: 1.07 mg/dL (ref 0.76–1.27)
Globulin, Total: 1.9 g/dL (ref 1.5–4.5)
Glucose: 117 mg/dL — ABNORMAL HIGH (ref 70–99)
Potassium: 3.9 mmol/L (ref 3.5–5.2)
Sodium: 144 mmol/L (ref 134–144)
Total Protein: 5.9 g/dL — ABNORMAL LOW (ref 6.0–8.5)
eGFR: 72 mL/min/{1.73_m2} (ref >59)

## 2023-07-07 ENCOUNTER — Other Ambulatory Visit: Payer: Self-pay

## 2023-07-07 MED ORDER — ROSUVASTATIN CALCIUM 20 MG PO TABS: 20.0000 mg | ORAL_TABLET | Freq: Every day | ORAL | 3 refills | Status: DC

## 2023-07-12 DIAGNOSIS — M9903 Segmental and somatic dysfunction of lumbar region: Secondary | ICD-10-CM | POA: Diagnosis not present

## 2023-07-12 DIAGNOSIS — M4306 Spondylolysis, lumbar region: Secondary | ICD-10-CM | POA: Diagnosis not present

## 2023-07-12 DIAGNOSIS — M5417 Radiculopathy, lumbosacral region: Secondary | ICD-10-CM | POA: Diagnosis not present

## 2023-07-12 DIAGNOSIS — M9905 Segmental and somatic dysfunction of pelvic region: Secondary | ICD-10-CM | POA: Diagnosis not present

## 2023-07-14 DIAGNOSIS — G4733 Obstructive sleep apnea (adult) (pediatric): Secondary | ICD-10-CM | POA: Diagnosis not present

## 2023-07-15 ENCOUNTER — Encounter: Payer: Self-pay | Admitting: Internal Medicine

## 2023-07-15 DIAGNOSIS — I10 Essential (primary) hypertension: Secondary | ICD-10-CM

## 2023-07-15 MED ORDER — TORSEMIDE 10 MG PO TABS
10.0000 mg | ORAL_TABLET | Freq: Every day | ORAL | 0 refills | Status: DC
Start: 2023-07-15 — End: 2023-10-17

## 2023-08-01 ENCOUNTER — Encounter: Payer: Self-pay | Admitting: Internal Medicine

## 2023-08-01 ENCOUNTER — Other Ambulatory Visit: Payer: Self-pay

## 2023-08-01 MED ORDER — ALBUTEROL SULFATE HFA 108 (90 BASE) MCG/ACT IN AERS: INHALATION_SPRAY | RESPIRATORY_TRACT | 2 refills | Status: DC

## 2023-08-02 DIAGNOSIS — M5417 Radiculopathy, lumbosacral region: Secondary | ICD-10-CM | POA: Diagnosis not present

## 2023-08-02 DIAGNOSIS — M9903 Segmental and somatic dysfunction of lumbar region: Secondary | ICD-10-CM | POA: Diagnosis not present

## 2023-08-02 DIAGNOSIS — M4306 Spondylolysis, lumbar region: Secondary | ICD-10-CM | POA: Diagnosis not present

## 2023-08-02 DIAGNOSIS — M9905 Segmental and somatic dysfunction of pelvic region: Secondary | ICD-10-CM | POA: Diagnosis not present

## 2023-08-08 DIAGNOSIS — J8283 Eosinophilic asthma: Secondary | ICD-10-CM | POA: Diagnosis not present

## 2023-08-08 DIAGNOSIS — J4530 Mild persistent asthma, uncomplicated: Secondary | ICD-10-CM | POA: Diagnosis not present

## 2023-08-17 DIAGNOSIS — J8283 Eosinophilic asthma: Secondary | ICD-10-CM | POA: Diagnosis not present

## 2023-08-17 DIAGNOSIS — J301 Allergic rhinitis due to pollen: Secondary | ICD-10-CM | POA: Diagnosis not present

## 2023-08-17 DIAGNOSIS — G4733 Obstructive sleep apnea (adult) (pediatric): Secondary | ICD-10-CM | POA: Diagnosis not present

## 2023-08-17 DIAGNOSIS — Z79899 Other long term (current) drug therapy: Secondary | ICD-10-CM | POA: Diagnosis not present

## 2023-08-22 ENCOUNTER — Encounter: Payer: Self-pay | Admitting: Podiatry

## 2023-08-22 ENCOUNTER — Encounter: Payer: Self-pay | Admitting: Internal Medicine

## 2023-08-22 ENCOUNTER — Ambulatory Visit: Payer: Medicare HMO | Admitting: Podiatry

## 2023-08-22 DIAGNOSIS — M1A9XX0 Chronic gout, unspecified, without tophus (tophi): Secondary | ICD-10-CM

## 2023-08-22 DIAGNOSIS — B351 Tinea unguium: Secondary | ICD-10-CM

## 2023-08-22 DIAGNOSIS — M79674 Pain in right toe(s): Secondary | ICD-10-CM | POA: Diagnosis not present

## 2023-08-22 DIAGNOSIS — M79675 Pain in left toe(s): Secondary | ICD-10-CM

## 2023-08-22 MED ORDER — COLCHICINE 0.6 MG PO TABS: 0.6000 mg | ORAL_TABLET | Freq: Every day | ORAL | 0 refills | Status: DC

## 2023-08-22 NOTE — Telephone Encounter (Signed)
 Noted.

## 2023-08-23 DIAGNOSIS — M9905 Segmental and somatic dysfunction of pelvic region: Secondary | ICD-10-CM | POA: Diagnosis not present

## 2023-08-23 DIAGNOSIS — M4306 Spondylolysis, lumbar region: Secondary | ICD-10-CM | POA: Diagnosis not present

## 2023-08-23 DIAGNOSIS — M5417 Radiculopathy, lumbosacral region: Secondary | ICD-10-CM | POA: Diagnosis not present

## 2023-08-23 DIAGNOSIS — M9903 Segmental and somatic dysfunction of lumbar region: Secondary | ICD-10-CM | POA: Diagnosis not present

## 2023-08-27 ENCOUNTER — Encounter: Payer: Self-pay | Admitting: Podiatry

## 2023-08-27 NOTE — Progress Notes (Signed)
  Subjective:  Patient ID: Alexander Fitzgerald, male    DOB: 1945/09/16,  MRN: [id number]  78 y.o. male presents painful elongated mycotic toenails 1-5 bilaterally which are tender when wearing enclosed shoe gear. Pain is relieved with periodic professional debridement. Chief Complaint  Patient presents with   Nail Problem    "Just a nail trim"   New problem(s): None   Allergies  Allergen Reactions   Sulfa Drugs Cross Reactors     Blood in urine    Review of Systems: Negative except as noted in the HPI.   Objective:  Alexander Fitzgerald is a pleasant 78 y.o. male WD, WN in NAD. AAO x 3.  Vascular Examination: Vascular status intact b/l with palpable pedal pulses. CFT immediate b/l. Pedal hair present. No edema. No pain with calf compression b/l. Skin temperature gradient WNL b/l. No varicosities noted. No cyanosis or clubbing noted.  Neurological Examination: Sensation grossly intact b/l with 10 gram monofilament. Vibratory sensation intact b/l.  Dermatological Examination: Pedal skin with normal turgor, texture and tone b/l. No open wounds nor interdigital macerations noted. Toenails 1-5 b/l thick, discolored, elongated with subungual debris and pain on dorsal palpation. No hyperkeratotic lesions noted b/l.   Musculoskeletal Examination: Muscle strength 5/5 to b/l LE.  No pain, crepitus noted b/l. No gross pedal deformities.   Radiographs: None  Last A1c:      Latest Ref Rng & Units 03/28/2023   11:01 AM  Hemoglobin A1C  Hemoglobin-A1c 4.6 - 6.5 % 6.3    Assessment:   1. Pain due to onychomycosis of toenails of both feet     Plan:  Consent given for treatment. Patient examined. All patient's and/or POA's questions/concerns addressed on today's visit.Toenails 1-5 debrided in length and girth without incident. Continue soft, supportive shoe gear daily. Report any pedal injuries to medical professional. Call office if there are any questions/concerns. -Patient/POA to call  should there be question/concern in the interim.  Return in about 3 months (around 11/22/2023).  Luella Sager, DPM      [place] LOCATION: [address] [place] [address]                   Office [phone]   [facility] LOCATION: [address] [place], [place] [address] Office [phone]

## 2023-09-13 ENCOUNTER — Other Ambulatory Visit: Payer: Self-pay | Admitting: Internal Medicine

## 2023-09-13 DIAGNOSIS — M1A9XX0 Chronic gout, unspecified, without tophus (tophi): Secondary | ICD-10-CM

## 2023-09-15 DIAGNOSIS — M9905 Segmental and somatic dysfunction of pelvic region: Secondary | ICD-10-CM | POA: Diagnosis not present

## 2023-09-15 DIAGNOSIS — M9903 Segmental and somatic dysfunction of lumbar region: Secondary | ICD-10-CM | POA: Diagnosis not present

## 2023-09-15 DIAGNOSIS — M5417 Radiculopathy, lumbosacral region: Secondary | ICD-10-CM | POA: Diagnosis not present

## 2023-09-15 DIAGNOSIS — M4306 Spondylolysis, lumbar region: Secondary | ICD-10-CM | POA: Diagnosis not present

## 2023-09-27 ENCOUNTER — Encounter: Payer: Medicare HMO | Admitting: Family Medicine

## 2023-09-28 ENCOUNTER — Ambulatory Visit (INDEPENDENT_AMBULATORY_CARE_PROVIDER_SITE_OTHER): Admitting: Internal Medicine

## 2023-09-28 ENCOUNTER — Encounter: Payer: Self-pay | Admitting: Internal Medicine

## 2023-09-28 VITALS — Ht 70.0 in

## 2023-09-28 DIAGNOSIS — R7303 Prediabetes: Secondary | ICD-10-CM | POA: Diagnosis not present

## 2023-09-28 DIAGNOSIS — E782 Mixed hyperlipidemia: Secondary | ICD-10-CM

## 2023-09-28 DIAGNOSIS — J4530 Mild persistent asthma, uncomplicated: Secondary | ICD-10-CM

## 2023-09-28 DIAGNOSIS — E559 Vitamin D deficiency, unspecified: Secondary | ICD-10-CM | POA: Insufficient documentation

## 2023-09-28 DIAGNOSIS — E890 Postprocedural hypothyroidism: Secondary | ICD-10-CM

## 2023-09-28 DIAGNOSIS — M1A0710 Idiopathic chronic gout, right ankle and foot, without tophus (tophi): Secondary | ICD-10-CM | POA: Diagnosis not present

## 2023-09-28 DIAGNOSIS — K219 Gastro-esophageal reflux disease without esophagitis: Secondary | ICD-10-CM | POA: Diagnosis not present

## 2023-09-28 DIAGNOSIS — I10 Essential (primary) hypertension: Secondary | ICD-10-CM

## 2023-09-28 MED ORDER — LEVOTHYROXINE SODIUM 100 MCG PO TABS: 100.0000 ug | ORAL_TABLET | Freq: Every day | ORAL | 1 refills | Status: DC

## 2023-09-28 MED ORDER — PANTOPRAZOLE SODIUM 40 MG PO TBEC: 40.0000 mg | DELAYED_RELEASE_TABLET | Freq: Every day | ORAL | 3 refills | Status: DC

## 2023-09-28 NOTE — Assessment & Plan Note (Signed)
-   This problem is chronic and stable -[name] last A1c in December was 6.3 -Will repeat his A1c again today -Patient is attempting to lose weight with diet and exercise.  He was encouraged to continue to do this -No further workup at this time

## 2023-09-28 NOTE — Assessment & Plan Note (Signed)
-   This problem is chronic -[name] last vitamin D  level was in January 2017 -It was low at that time (21.9) -Will recheck his vitamin D  level today and supplement if necessary -No further work up at this time

## 2023-09-28 NOTE — Patient Instructions (Addendum)
-   It was a pleasure meeting you today - You are due for some blood work including thyroid  function, uric acid, A1c and vitamin D  levels.  We will check these for you today - I put in refills for your thyroid  medication as well as Protonix  - We will make a decision on your gout management based on uric acid level -Your blood pressure is well-controlled.  Keep up the great work! -Continue with diet and exercise modifications to help lose weight - Please contact us  with any questions or concerns or if you need any refills

## 2023-09-28 NOTE — Assessment & Plan Note (Signed)
-   This problem is chronic and stable -Patient is on Crestor  20 mg daily.  He had a lipid panel done in March with his LDL at goal -No further workup at this time

## 2023-09-28 NOTE — Assessment & Plan Note (Signed)
-   This problem is chronic and stable -Patient follows up with his pulmonologist at [facility] clinic for this -He is on Dupixent, Trelegy as well as albuterol  as needed and recently restarted his Singulair  -States that he does not note worsening symptoms over the weekend prior to his weekly Dupixent injection on Monday.  He does take albuterol  as needed for this with improvement in his symptoms -Will continue his current medication regimen for now -No further workup at this time

## 2023-09-28 NOTE — Assessment & Plan Note (Signed)
-   This problem is chronic stable -Patient denies any recent gout flares -He is currently on Uloric  and colchicine  daily - Patient was on allopurinol  last year for gout prevention but was noted to have mild transaminitis and this medication was discontinued -Repeat LFTs done in March were within normal limits -Will check a uric acid level today -Uloric  is contraindicated in patients who have a history of major cardiovascular disease.  Patient does not have a history of this so okay to continue for now -If uric acid level is less than 6 would DC colchicine  and continue with Uloric  alone

## 2023-09-28 NOTE — Assessment & Plan Note (Signed)
-   This problem is chronic and stable -Patient blood pressure is well-controlled on lisinopril  10 mg daily and torsemide  10 mg daily -Blood pressure today is 126/82 -He had a recent BMP done in March which was normal -No further workup at this time

## 2023-09-28 NOTE — Progress Notes (Signed)
 Established Patient Office Visit  Subjective   Patient ID: Alexander Fitzgerald, male    DOB: Aug 20, 1945  Age: 78 y.o. MRN: [id number]  Chief Complaint  Patient presents with   Medical Management of Chronic Issues    HPI  Patient is here for routine follow-up of his gout, hypertension and hypothyroidism.  Patient states that he has not had a recent gout flare on the colchicine  and Uloric .  He is uncertain whether he needs to continue on the colchicine  or not.  Patient does have a history of hypertension on lisinopril  and torsemide .  Her blood pressure has been well-controlled.  His blood pressure today is 126/82.  Patient does have a history of hypothyroidism and has been stable on Synthroid  100 mcg daily.  He denies any symptoms of hypo and hyperthyroidism currently.  He does state that he needs refills on his Synthroid   He does state that he wants to be on Protonix  for his GERD instead of Dexilant and requires a prescription for this  [name] asthma has been better controlled on Dupixent and Trelegy with albuterol  as needed.  He was also recently restarted on his Singulair .  Patient states that symptoms are generally well-controlled but they are exacerbated by his allergies and he does use albuterol  as needed when he knows he is going to have exposure to his allergens.  He also states that he noticed increased symptoms on Saturday and Sunday before his weekly Dupixent injection on Monday and feels like the effect is wearing off slowly prior to this injection.  Review of Systems  Constitutional: Negative.   HENT: Negative.    Respiratory: Negative.    Cardiovascular: Negative.   Gastrointestinal: Negative.   Musculoskeletal: Negative.   Neurological: Negative.   Psychiatric/Behavioral: Negative.        Objective:     Ht 5' 10 (1.778 m)   BMI 31.45 kg/m    Physical Exam Constitutional:      Appearance: Normal appearance.  HENT:     Head: Normocephalic and  atraumatic.  Cardiovascular:     Rate and Rhythm: Normal rate and regular rhythm.     Heart sounds: Normal heart sounds.  Pulmonary:     Breath sounds: Normal breath sounds. No wheezing or rales.  Abdominal:     General: Bowel sounds are normal. There is no distension.     Palpations: Abdomen is soft.     Tenderness: There is no abdominal tenderness.  Musculoskeletal:        General: No tenderness.     Cervical back: Neck supple.     Comments: Trace bilateral lower extremity edema noted more on the left than the right  Lymphadenopathy:     Cervical: No cervical adenopathy.  Neurological:     Mental Status: He is alert and oriented to person, place, and time.  Psychiatric:        Mood and Affect: Mood normal.        Behavior: Behavior normal.      No results found for any visits on 09/28/23.    The ASCVD Risk score (Arnett DK, et al., 2019) failed to calculate for the following reasons:   The valid total cholesterol range is 130 to 320 mg/dL    Assessment & Plan:   Problem List Items Addressed This Visit       Cardiovascular and Mediastinum   Hypertension - Primary (Chronic)   - This problem is chronic and stable -Patient blood pressure is well-controlled on lisinopril   10 mg daily and torsemide  10 mg daily -Blood pressure today is 126/82 -He had a recent BMP done in March which was normal -No further workup at this time        Respiratory   Asthma (Chronic)   - This problem is chronic and stable -Patient follows up with his pulmonologist at [facility] clinic for this -He is on Dupixent, Trelegy as well as albuterol  as needed and recently restarted his Singulair  -States that he does not note worsening symptoms over the weekend prior to his weekly Dupixent injection on Monday.  He does take albuterol  as needed for this with improvement in his symptoms -Will continue his current medication regimen for now -No further workup at this time        Digestive   GERD  (gastroesophageal reflux disease)   - This problem is chronic and stable -Patient denies any reflux symptoms currently -She was on Dexilant but would like to be switched back to Protonix  -I have sent in a prescription for Protonix  40 mg daily -No further workup at this time      Relevant Medications   pantoprazole  (PROTONIX ) 40 MG tablet     Endocrine   Postsurgical hypothyroidism (Chronic)   - This problem is chronic and stable -Patient without any symptoms of hypo/hyperthyroidism currently -Will continue Synthroid  100 mcg daily.  Refill called into his pharmacy today -Will check TSH today -No further workup at this time      Relevant Medications   levothyroxine  (SYNTHROID ) 100 MCG tablet   Other Relevant Orders   TSH     Other   Gout   - This problem is chronic stable -Patient denies any recent gout flares -He is currently on Uloric  and colchicine  daily - Patient was on allopurinol  last year for gout prevention but was noted to have mild transaminitis and this medication was discontinued -Repeat LFTs done in March were within normal limits -Will check a uric acid level today -Uloric  is contraindicated in patients who have a history of major cardiovascular disease.  Patient does not have a history of this so okay to continue for now -If uric acid level is less than 6 would DC colchicine  and continue with Uloric  alone      Relevant Orders   Uric acid   Hyperlipidemia, mixed   - This problem is chronic and stable -Patient is on Crestor  20 mg daily.  He had a lipid panel done in March with his LDL at goal -No further workup at this time      Pre-diabetes   - This problem is chronic and stable -[name] last A1c in December was 6.3 -Will repeat his A1c again today -Patient is attempting to lose weight with diet and exercise.  He was encouraged to continue to do this -No further workup at this time      Relevant Orders   Hemoglobin A1c   Vitamin D  deficiency   -  This problem is chronic -[name] last vitamin D  level was in January 2017 -It was low at that time (21.9) -Will recheck his vitamin D  level today and supplement if necessary -No further work up at this time      Relevant Orders   VITAMIN D  25 Hydroxy (Vit-D Deficiency, Fractures)    No follow-ups on file.    Mihcael Ledee, MD

## 2023-09-28 NOTE — Assessment & Plan Note (Signed)
-   This problem is chronic and stable -Patient without any symptoms of hypo/hyperthyroidism currently -Will continue Synthroid  100 mcg daily.  Refill called into his pharmacy today -Will check TSH today -No further workup at this time

## 2023-09-28 NOTE — Assessment & Plan Note (Signed)
-   This problem is chronic and stable -Patient denies any reflux symptoms currently -She was on Dexilant but would like to be switched back to Protonix  -I have sent in a prescription for Protonix  40 mg daily -No further workup at this time

## 2023-09-29 ENCOUNTER — Ambulatory Visit: Payer: Self-pay | Admitting: Internal Medicine

## 2023-09-29 DIAGNOSIS — E890 Postprocedural hypothyroidism: Secondary | ICD-10-CM

## 2023-09-29 LAB — HEMOGLOBIN A1C
Est. average glucose Bld gHb Est-mCnc: 131 mg/dL
Hgb A1c MFr Bld: 6.2 % — ABNORMAL HIGH (ref 4.8–5.6)

## 2023-09-29 LAB — VITAMIN D 25 HYDROXY (VIT D DEFICIENCY, FRACTURES): Vit D, 25-Hydroxy: 69.1 ng/mL (ref 30.0–100.0)

## 2023-09-29 LAB — TSH: TSH: 0.113 u[IU]/mL — ABNORMAL LOW (ref 0.450–4.500)

## 2023-09-29 MED ORDER — LEVOTHYROXINE SODIUM 75 MCG PO TABS: 75.0000 ug | ORAL_TABLET | Freq: Every day | ORAL | 3 refills | Status: DC

## 2023-10-09 ENCOUNTER — Other Ambulatory Visit: Payer: Self-pay | Admitting: Internal Medicine

## 2023-10-09 ENCOUNTER — Other Ambulatory Visit: Payer: Self-pay | Admitting: Dermatology

## 2023-10-11 ENCOUNTER — Other Ambulatory Visit: Payer: Self-pay | Admitting: *Deleted

## 2023-10-11 DIAGNOSIS — M1A0710 Idiopathic chronic gout, right ankle and foot, without tophus (tophi): Secondary | ICD-10-CM

## 2023-10-11 DIAGNOSIS — M9903 Segmental and somatic dysfunction of lumbar region: Secondary | ICD-10-CM | POA: Diagnosis not present

## 2023-10-11 DIAGNOSIS — M5417 Radiculopathy, lumbosacral region: Secondary | ICD-10-CM | POA: Diagnosis not present

## 2023-10-11 DIAGNOSIS — M9905 Segmental and somatic dysfunction of pelvic region: Secondary | ICD-10-CM | POA: Diagnosis not present

## 2023-10-11 DIAGNOSIS — M4306 Spondylolysis, lumbar region: Secondary | ICD-10-CM | POA: Diagnosis not present

## 2023-10-11 MED ORDER — FEBUXOSTAT 40 MG PO TABS: 40.0000 mg | ORAL_TABLET | Freq: Every day | ORAL | 1 refills | Status: AC

## 2023-10-15 DIAGNOSIS — G4733 Obstructive sleep apnea (adult) (pediatric): Secondary | ICD-10-CM | POA: Diagnosis not present

## 2023-10-16 ENCOUNTER — Encounter: Payer: Self-pay | Admitting: Internal Medicine

## 2023-10-16 DIAGNOSIS — I10 Essential (primary) hypertension: Secondary | ICD-10-CM

## 2023-10-17 MED ORDER — TORSEMIDE 10 MG PO TABS: 10.0000 mg | ORAL_TABLET | Freq: Every day | ORAL | 1 refills | Status: DC

## 2023-11-01 DIAGNOSIS — M4306 Spondylolysis, lumbar region: Secondary | ICD-10-CM | POA: Diagnosis not present

## 2023-11-01 DIAGNOSIS — M9905 Segmental and somatic dysfunction of pelvic region: Secondary | ICD-10-CM | POA: Diagnosis not present

## 2023-11-01 DIAGNOSIS — M5417 Radiculopathy, lumbosacral region: Secondary | ICD-10-CM | POA: Diagnosis not present

## 2023-11-01 DIAGNOSIS — M9903 Segmental and somatic dysfunction of lumbar region: Secondary | ICD-10-CM | POA: Diagnosis not present

## 2023-11-22 DIAGNOSIS — M5417 Radiculopathy, lumbosacral region: Secondary | ICD-10-CM | POA: Diagnosis not present

## 2023-11-22 DIAGNOSIS — M9905 Segmental and somatic dysfunction of pelvic region: Secondary | ICD-10-CM | POA: Diagnosis not present

## 2023-11-22 DIAGNOSIS — M4306 Spondylolysis, lumbar region: Secondary | ICD-10-CM | POA: Diagnosis not present

## 2023-11-22 DIAGNOSIS — M9903 Segmental and somatic dysfunction of lumbar region: Secondary | ICD-10-CM | POA: Diagnosis not present

## 2023-11-25 ENCOUNTER — Encounter: Payer: Self-pay | Admitting: Podiatry

## 2023-11-25 ENCOUNTER — Ambulatory Visit: Admitting: Podiatry

## 2023-11-25 DIAGNOSIS — B351 Tinea unguium: Secondary | ICD-10-CM

## 2023-11-25 DIAGNOSIS — M79674 Pain in right toe(s): Secondary | ICD-10-CM

## 2023-11-25 DIAGNOSIS — M79675 Pain in left toe(s): Secondary | ICD-10-CM

## 2023-11-28 NOTE — Progress Notes (Signed)
  Subjective:  Patient ID: Alexander Fitzgerald, male    DOB: Jan 14, 1946,  MRN: [id number]  78 y.o. male presents painful thick toenails that are difficult to trim. Pain interferes with ambulation. Aggravating factors include wearing enclosed shoe gear. Pain is relieved with periodic professional debridement. Chief Complaint  Patient presents with   RFC    Rm4 Routine Foot care/ Dr. Comer Ferrari with see in November   New problem(s): None  Allergies  Allergen Reactions   Sulfa Drugs Cross Reactors Other (See Comments)    Urinary bleeding    Review of Systems: Negative except as noted in the HPI.   Objective:  Alexander Fitzgerald is a pleasant 78 y.o. male WD, WN in NAD. AAO x 3.  Vascular Examination: Vascular status intact b/l with palpable pedal pulses. CFT immediate b/l. Pedal hair present. No edema. No pain with calf compression b/l. Skin temperature gradient WNL b/l. No varicosities noted. No cyanosis or clubbing noted.  Neurological Examination: Sensation grossly intact b/l with 10 gram monofilament. Vibratory sensation intact b/l.  Dermatological Examination: Pedal skin with normal turgor, texture and tone b/l. No open wounds nor interdigital macerations noted. Toenails 1-5 b/l thick, discolored, elongated with subungual debris and pain on dorsal palpation. No hyperkeratotic lesions noted b/l.   Musculoskeletal Examination: Muscle strength 5/5 to b/l LE.  No pain, crepitus noted b/l. No gross pedal deformities. Patient ambulates independently without assistive aids.   Radiographs: None  Last A1c:      Latest Ref Rng & Units 09/28/2023    1:37 PM 03/28/2023   11:01 AM  Hemoglobin A1C  Hemoglobin-A1c 4.8 - 5.6 % 6.2  6.3    Assessment:   1. Pain due to onychomycosis of toenails of both feet    Plan:  Patient was evaluated and treated. All patient's and/or POA's questions/concerns addressed on today's visit. We discussed trying open toed compression hose.  Mycotic  toenails 1-5 debrided in length and girth without incident. Continue soft, supportive shoe gear daily. Report any pedal injuries to medical professional. Call office if there are any quesitons/concerns. -Patient/POA to call should there be question/concern in the interim.  Return in about 3 months (around 02/25/2024).  Alexander Fitzgerald, DPM      [place] LOCATION: [address] [place] [address]                   Office [phone]   [facility] LOCATION: [address] [place], [place] [address] Office [phone]

## 2023-12-05 ENCOUNTER — Other Ambulatory Visit: Payer: Self-pay | Admitting: Internal Medicine

## 2023-12-05 DIAGNOSIS — M1A9XX0 Chronic gout, unspecified, without tophus (tophi): Secondary | ICD-10-CM

## 2023-12-13 DIAGNOSIS — M9905 Segmental and somatic dysfunction of pelvic region: Secondary | ICD-10-CM | POA: Diagnosis not present

## 2023-12-13 DIAGNOSIS — M9903 Segmental and somatic dysfunction of lumbar region: Secondary | ICD-10-CM | POA: Diagnosis not present

## 2023-12-13 DIAGNOSIS — M5417 Radiculopathy, lumbosacral region: Secondary | ICD-10-CM | POA: Diagnosis not present

## 2023-12-13 DIAGNOSIS — M4306 Spondylolysis, lumbar region: Secondary | ICD-10-CM | POA: Diagnosis not present

## 2023-12-25 ENCOUNTER — Other Ambulatory Visit: Payer: Self-pay | Admitting: Internal Medicine

## 2023-12-25 DIAGNOSIS — K219 Gastro-esophageal reflux disease without esophagitis: Secondary | ICD-10-CM

## 2023-12-26 MED ORDER — PANTOPRAZOLE SODIUM 40 MG PO TBEC: 40.0000 mg | DELAYED_RELEASE_TABLET | Freq: Every day | ORAL | 3 refills | Status: DC

## 2024-01-03 ENCOUNTER — Ambulatory Visit

## 2024-01-03 DIAGNOSIS — C44319 Basal cell carcinoma of skin of other parts of face: Secondary | ICD-10-CM | POA: Diagnosis not present

## 2024-01-03 DIAGNOSIS — Z1283 Encounter for screening for malignant neoplasm of skin: Secondary | ICD-10-CM

## 2024-01-03 DIAGNOSIS — W908XXA Exposure to other nonionizing radiation, initial encounter: Secondary | ICD-10-CM

## 2024-01-03 DIAGNOSIS — L814 Other melanin hyperpigmentation: Secondary | ICD-10-CM

## 2024-01-03 DIAGNOSIS — M5417 Radiculopathy, lumbosacral region: Secondary | ICD-10-CM | POA: Diagnosis not present

## 2024-01-03 DIAGNOSIS — L570 Actinic keratosis: Secondary | ICD-10-CM

## 2024-01-03 DIAGNOSIS — L821 Other seborrheic keratosis: Secondary | ICD-10-CM

## 2024-01-03 DIAGNOSIS — M4306 Spondylolysis, lumbar region: Secondary | ICD-10-CM | POA: Diagnosis not present

## 2024-01-03 DIAGNOSIS — Z85828 Personal history of other malignant neoplasm of skin: Secondary | ICD-10-CM

## 2024-01-03 DIAGNOSIS — C4491 Basal cell carcinoma of skin, unspecified: Secondary | ICD-10-CM

## 2024-01-03 DIAGNOSIS — M9903 Segmental and somatic dysfunction of lumbar region: Secondary | ICD-10-CM | POA: Diagnosis not present

## 2024-01-03 DIAGNOSIS — L578 Other skin changes due to chronic exposure to nonionizing radiation: Secondary | ICD-10-CM

## 2024-01-03 DIAGNOSIS — L820 Inflamed seborrheic keratosis: Secondary | ICD-10-CM | POA: Diagnosis not present

## 2024-01-03 DIAGNOSIS — D489 Neoplasm of uncertain behavior, unspecified: Secondary | ICD-10-CM | POA: Diagnosis not present

## 2024-01-03 DIAGNOSIS — C44212 Basal cell carcinoma of skin of right ear and external auricular canal: Secondary | ICD-10-CM | POA: Diagnosis not present

## 2024-01-03 DIAGNOSIS — M9905 Segmental and somatic dysfunction of pelvic region: Secondary | ICD-10-CM | POA: Diagnosis not present

## 2024-01-03 HISTORY — DX: Basal cell carcinoma of skin, unspecified: C44.91

## 2024-01-03 NOTE — Patient Instructions (Signed)

## 2024-01-03 NOTE — Progress Notes (Signed)
 Subjective   Alexander Fitzgerald is a 78 y.o. male who presents for the following: Lesion(s) of concern . Patient is established patient   Today patient reports: Lesion on the right temple; was treated with cryotherapy 06/20/23 byt Dr. Hester. Patient states that lesion never went away.   Review of Systems:    No other skin or systemic complaints except as noted in HPI or Assessment and Plan.  The following portions of the chart were reviewed this encounter and updated as appropriate: medications, allergies, medical history  Relevant Medical History:  Personal history of non melanoma skin cancer - see medical history for full details   Objective  Well appearing patient in no apparent distress; mood and affect are within normal limits. Examination was performed of the: Sun exposed areas of the upper body  Examination notable for: Lentigo/lentigines: Scattered pigmented macules that are tan to brown in color and are somewhat non-uniform in shape and concentrated in the sun-exposed areas, Seborrheic Keratosis(es): Stuck-on appearing keratotic papule(s) on the trunk, some  irritated with redness, crusting, edema, and/or partial avulsion, Actinic Damage/Elastosis: chronic sun damage: dyspigmentation, telangiectasia, and wrinkling, Actinic keratosis: Scaly erythematous macule(s) concentrated on sun exposed areas   Examination limited by: Clothing and Patient deferred removal     Right preorricular 9mm pink ulcerated papule   Face, ears, scalp x8 Pink scaly macules Right shoulder x2 Stuck on waxy paps with erythema  Assessment & Plan   SKIN CANCER SCREENING PERFORMED TODAY.  BENIGN SKIN FINDINGS  - Lentigines  - Seborrheic keratoses - Reassurance provided regarding the benign appearance of lesions noted on exam today; no treatment is indicated in the absence of symptoms/changes. - Reinforced importance of photoprotective strategies including liberal and frequent sunscreen use of a  broad-spectrum SPF 30 or greater, use of protective clothing, and sun avoidance for prevention of cutaneous malignancy and photoaging.  Counseled patient on the importance of regular self-skin monitoring as well as routine clinical skin examinations as scheduled.   ACTINIC DAMAGE - Chronic condition, secondary to cumulative UV/sun exposure - Recommend daily broad spectrum sunscreen SPF 30+ to sun-exposed areas, reapply every 2 hours as needed.  - Staying in the shade or wearing long sleeves, sun glasses (UVA+UVB protection) and wide brim hats (4-inch brim around the entire circumference of the hat) are also recommended for sun protection.  - Call for new or changing lesions.  Personal history of non melanoma skin cancer  - Reviewed medical history for full details  - Reviewed sun protective measures as above - Encouraged full body skin exams     Level of service outlined above   Procedures, orders, diagnosis for this visit:  NEOPLASM OF UNCERTAIN BEHAVIOR Right preorricular Skin / nail biopsy Type of biopsy: tangential   Informed consent: discussed and consent obtained   Timeout: patient name, date of birth, surgical site, and procedure verified   Patient was prepped and draped in usual sterile fashion: Area prepped with alcohol. Anesthesia: the lesion was anesthetized in a standard fashion   Anesthetic:  1% lidocaine  w/ epinephrine 1-100,000 buffered w/ 8.4% NaHCO3 Instrument used: flexible razor blade   Hemostasis achieved with: pressure, aluminum chloride and electrodesiccation   Outcome: patient tolerated procedure well   Post-procedure details: wound care instructions given   Post-procedure details comment:  Ointment and small bandage applied  Specimen 1 - Surgical pathology Differential Diagnosis: BCC vs SCC vs Other   Check Margins: No ACTINIC KERATOSIS Face, ears, scalp x8 Actinic keratoses are  precancerous spots that appear secondary to cumulative UV radiation  exposure/sun exposure over time. They are chronic with expected duration over 1 year. A portion of actinic keratoses will progress to squamous cell carcinoma of the skin. It is not possible to reliably predict which spots will progress to skin cancer and so treatment is recommended to prevent development of skin cancer.  Recommend daily broad spectrum sunscreen SPF 30+ to sun-exposed areas, reapply every 2 hours as needed.  Recommend staying in the shade or wearing long sleeves, sun glasses (UVA+UVB protection) and wide brim hats (4-inch brim around the entire circumference of the hat). Call for new or changing lesions. Destruction of lesion - Face, ears, scalp x8 Complexity: simple   Destruction method: cryotherapy   Informed consent: discussed and consent obtained   Timeout:  patient name, date of birth, surgical site, and procedure verified Lesion destroyed using liquid nitrogen: Yes   Region frozen until ice ball extended beyond lesion: Yes   Cryo cycles: 1 or 2. Outcome: patient tolerated procedure well with no complications   Post-procedure details: wound care instructions given   Additional details:  Prior to procedure, discussed risks of blister formation, small wound, skin dyspigmentation, or rare scar following cryotherapy. Recommend Vaseline ointment to treated areas while healing.   INFLAMED SEBORRHEIC KERATOSIS Right shoulder x2 Symptomatic, irritating, patient would like treated. Destruction of lesion - Right shoulder x2 Complexity: simple   Destruction method: cryotherapy   Informed consent: discussed and consent obtained   Timeout:  patient name, date of birth, surgical site, and procedure verified Lesion destroyed using liquid nitrogen: Yes   Region frozen until ice ball extended beyond lesion: Yes   Cryo cycles: 1 or 2. Outcome: patient tolerated procedure well with no complications   Post-procedure details: wound care instructions given   Additional details:  Prior to  procedure, discussed risks of blister formation, small wound, skin dyspigmentation, or rare scar following cryotherapy. Recommend Vaseline ointment to treated areas while healing.    Neoplasm of uncertain behavior -     Skin / nail biopsy -     Surgical pathology; Standing  Actinic keratosis -     Destruction of lesion  Inflamed seborrheic keratosis -     Destruction of lesion    Return to clinic: Return for Appointment as scheduled.  Documentation: I, Emerick Ege, CMA am acting as scribe for Lauraine JAYSON Kanaris, MD.    I have reviewed the above documentation for accuracy and completeness, and I agree with the above.  Lauraine JAYSON Kanaris, MD

## 2024-01-04 LAB — SURGICAL PATHOLOGY

## 2024-01-05 ENCOUNTER — Ambulatory Visit: Payer: Self-pay

## 2024-01-05 DIAGNOSIS — C44212 Basal cell carcinoma of skin of right ear and external auricular canal: Secondary | ICD-10-CM

## 2024-01-05 NOTE — Telephone Encounter (Signed)
 Patient advised of BX results by phone and advised of [facility] referral will take place. aw

## 2024-01-05 NOTE — Telephone Encounter (Signed)
-----   Message from Lauraine JAYSON Kanaris sent at 01/05/2024  8:52 AM EDT -----  1. Skin, right preauricular :       BASAL CELL CARCINOMA, NODULAR PATTERN, ULCERATED   Please notify patient with below plan: Mohs  ----- Message ----- From: Interface, Lab In [facility] One Sent: 01/04/2024   3:19 PM EDT To: Lauraine JAYSON Kanaris, MD

## 2024-01-16 DIAGNOSIS — G4733 Obstructive sleep apnea (adult) (pediatric): Secondary | ICD-10-CM | POA: Diagnosis not present

## 2024-01-18 HISTORY — PX: SKIN CANCER EXCISION: SHX779

## 2024-01-24 DIAGNOSIS — M9905 Segmental and somatic dysfunction of pelvic region: Secondary | ICD-10-CM | POA: Diagnosis not present

## 2024-01-24 DIAGNOSIS — M5417 Radiculopathy, lumbosacral region: Secondary | ICD-10-CM | POA: Diagnosis not present

## 2024-01-24 DIAGNOSIS — M4306 Spondylolysis, lumbar region: Secondary | ICD-10-CM | POA: Diagnosis not present

## 2024-01-24 DIAGNOSIS — M9903 Segmental and somatic dysfunction of lumbar region: Secondary | ICD-10-CM | POA: Diagnosis not present

## 2024-01-26 ENCOUNTER — Encounter: Payer: Self-pay | Admitting: Cardiology

## 2024-01-31 MED ORDER — PRAVASTATIN SODIUM 20 MG PO TABS: 20.0000 mg | ORAL_TABLET | Freq: Every evening | ORAL | 3 refills | Status: AC

## 2024-02-01 ENCOUNTER — Encounter: Payer: Self-pay | Admitting: Dermatology

## 2024-02-01 ENCOUNTER — Other Ambulatory Visit: Payer: Self-pay | Admitting: Emergency Medicine

## 2024-02-01 DIAGNOSIS — E782 Mixed hyperlipidemia: Secondary | ICD-10-CM

## 2024-02-06 ENCOUNTER — Encounter: Payer: Self-pay | Admitting: Dermatology

## 2024-02-06 ENCOUNTER — Ambulatory Visit: Admitting: Dermatology

## 2024-02-06 VITALS — BP 117/89 | HR 84 | Temp 98.0°F

## 2024-02-06 DIAGNOSIS — L814 Other melanin hyperpigmentation: Secondary | ICD-10-CM

## 2024-02-06 DIAGNOSIS — C44319 Basal cell carcinoma of skin of other parts of face: Secondary | ICD-10-CM

## 2024-02-06 DIAGNOSIS — C4491 Basal cell carcinoma of skin, unspecified: Secondary | ICD-10-CM

## 2024-02-06 DIAGNOSIS — L578 Other skin changes due to chronic exposure to nonionizing radiation: Secondary | ICD-10-CM | POA: Diagnosis not present

## 2024-02-06 NOTE — Patient Instructions (Signed)

## 2024-02-06 NOTE — Progress Notes (Signed)
 Follow-Up Visit   Subjective  Alexander Fitzgerald is a 78 y.o. male who presents for the following: Mohs for a Nodular Basal Cell Carcinoma on the right preauricular scalp. Lesion was biopsied on 01/03/2024 by Dr. Lauraine Kanaris.   Patient reports that the lesion has had cryotherapy x3.  The following portions of the chart were reviewed this encounter and updated as appropriate: medications, allergies, medical history  Review of Systems:  No other skin or systemic complaints except as noted in HPI or Assessment and Plan.  Objective  Well appearing patient in no apparent distress; mood and affect are within normal limits.  A focused examination was performed of the following areas: Right preauricular Relevant physical exam findings are noted in the Assessment and Plan.   Right Preauricular Area Pink scaly plaque   Assessment & Plan   BASAL CELL CARCINOMA (BCC), UNSPECIFIED SITE Right Preauricular Area Mohs surgery  Consent obtained: written  Anticoagulation: Is the patient taking prescription anticoagulant and/or aspirin  prescribed/recommended by a physician? Yes   Was the anticoagulation regimen changed prior to Mohs? No    Anesthesia: Anesthesia method: local infiltration Local anesthetic: lidocaine  1% WITH epi  Procedure Details: Timeout: pre-procedure verification complete Procedure Prep: patient was prepped and draped in usual sterile fashion Prep type: chlorhexidine Biopsy accession number: [id number] Biopsy lab: [facility] Date of biopsy: 01/03/2024 Pre-Op diagnosis: basal cell carcinoma BCC subtype: nodular MohsAIQ Surgical site (if tumor spans multiple areas, please select predominant area): cheek (including jawline) Surgery side: right Surgical site (from skin exam): Right Preauricular Area Pre-operative length (cm): 0.8 Pre-operative width (cm): 0.6 Indications for Mohs surgery: anatomic location where tissue conservation is critical Previously  treated? Yes   Previous treatment type: cryotherapy  Micrographic Surgery Details: Post-operative length (cm): 1.8 Post-operative width (cm): 1.4 Number of Mohs stages: 2 Post surgery depth of defect: dermis and subcutaneous fat  Stage 1    Tumor features identified on Mohs section: basal carcinoma    Depth of tumor invasion after stage: dermis and subcutaneous fat  Stage 2    Tumor features identified on Mohs section: no tumor identified  Reconstruction: Was the defect reconstructed? Yes   Was reconstruction performed by the same Mohs surgeon? Yes   Setting of reconstruction: outpatient office When was reconstruction performed? same day Type of reconstruction: linear Linear reconstruction: complex  Antibiotics: Does patient meet AHA guidelines for endocarditis?: No   Does patient meet AHA guidelines for orthopedic prophylaxis?: No   Were antibiotics given on the day of surgery?: No   Did surgery breach mucosa, expose cartilage/bone, involve an area of lymphedema/inflamed/infected tissue? No    Skin repair Complexity:  Complex Final length (cm):  5.7 Informed consent: discussed and consent obtained   Timeout: patient name, date of birth, surgical site, and procedure verified   Procedure prep:  Patient was prepped and draped in usual sterile fashion Prep type:  Chlorhexidine Anesthesia: the lesion was anesthetized in a standard fashion   Anesthetic:  1% lidocaine  w/ epinephrine 1-100,000 buffered w/ 8.4% NaHCO3 Reason for type of repair: reduce the risk of dehiscence, infection, and necrosis, preserve normal anatomy and preserve normal anatomical and functional relationships   Undermining: area extensively undermined   Subcutaneous layers (deep stitches):  Suture size:  4-0 Suture type: Vicryl (polyglactin 910)   Stitches:  Buried vertical mattress Fine/surface layer approximation (top stitches):  Suture size:  6-0 Suture type: fast-absorbing plain gut   Stitches:  simple running   Hemostasis achieved with:  suture, pressure and electrodesiccation Outcome: patient tolerated procedure well with no complications   Post-procedure details: sterile dressing applied and wound care instructions given   Dressing type: pressure dressing and petrolatum      Return in about 4 weeks (around 03/05/2024) for wound check, .  I, [name], RN, am acting as scribe for [name], MD .   02/06/2024  HISTORY OF PRESENT ILLNESS  Alexander Fitzgerald is seen in consultation at the request of Dr. Raymund for biopsy-proven Nodular Basal Cell Carcinoma of the right preauricular region. They note that the area has been present for about 4 months increasing in size with time.  There is no history of previous treatment.  Reports no other new or changing lesions and has no other complaints today.  Medications and allergies: see patient chart.  Review of systems: Reviewed 8 systems and notable for the above skin cancer.  All other systems reviewed are unremarkable/negative, unless noted in the HPI. Past medical history, surgical history, family history, social history were also reviewed and are noted in the chart/questionnaire.    PHYSICAL EXAMINATION  General: Well-appearing, in no acute distress, alert and oriented x 4. Vitals reviewed in chart (if available).   Skin: Exam reveals a 0.8 x 0.6 cm erythematous papule and biopsy scar on the right preauricular region. There are rhytids, telangiectasias, and lentigines, consistent with photodamage.  Biopsy report(s) reviewed, confirming the diagnosis.   ASSESSMENT  1) Nodular Basal Cell Carcinoma of the right preauricular region 2) photodamage 3) solar lentigines   PLAN   1. Due to location, size, histology, or recurrence and the likelihood of subclinical extension as well as the need to conserve normal surrounding tissue, the patient was deemed acceptable for Mohs micrographic surgery (MMS).  The nature and  purpose of the procedure, associated benefits and risks including recurrence and scarring, possible complications such as pain, infection, and bleeding, and alternative methods of treatment if appropriate were discussed with the patient during consent. The lesion location was verified by the patient, by reviewing previous notes, pathology reports, and by photographs as well as angulation measurements if available.  Informed consent was reviewed and signed by the patient, and timeout was performed at 9:00 AM. See op note below.  2. For the photodamage and solar lentigines, sun protection discussed/information given on OTC sunscreens, and we recommend continued regular follow-up with primary dermatologist every 6 months or sooner for any growing, bleeding, or changing lesions. 3. Prognosis and future surveillance discussed. 4. Letter with treatment outcome sent to referring provider. 5. Pain acetaminophen/ibuprofen  MOHS MICROGRAPHIC SURGERY AND RECONSTRUCTION  Initial size:   0.8 x 0.6 cm Surgical defect/wound size: 1.8 x 1.4 cm Anesthesia:    0.33% lidocaine  with 1:200,000 epinephrine EBL:    <5 mL Complications:  None Repair type:   Complex SQ suture:   4-0 Vicryl Cutaneous suture:  6-0 Plain gut Final size of the repair: 5.7 cm  Stages: 2  STAGE I: Anesthesia achieved with 0.5% lidocaine  with 1:200,000 epinephrine. ChloraPrep applied. 1 section(s) excised using Mohs technique (this includes total peripheral and deep tissue margin excision and evaluation with frozen sections, excised and interpreted by the same physician). The tumor was first debulked and then excised with an approx. 2 mm margin.  Hemostasis was achieved with electrocautery as needed.  The specimen was then oriented, subdivided/relaxed, inked, and processed using Mohs technique.    Frozen section analysis revealed a positive margin for  multiple, small buds of basaloid  cells descending from the epidermis with no dermal  invasion in the peripheral margin.    STAGE II: An additional 2 mm margin was excised.  Hemostasis was achieved with electrocautery as needed.  The specimen was then oriented, subdivided/relaxed, inked, and processed using Mohs technique. Evaluation of slides by the Mohs surgeon revealed clear tumor margins.  Reconstruction  The surgical wound was then cleaned, prepped, and re-anesthetized as above. Wound edges were undermined extensively along at least one entire edge and at a distance equal to or greater than the width of the defect (see wound defect size above) in order to achieve closure and decrease wound tension and anatomic distortion. Redundant tissue repair including standing cone removal was performed. Hemostasis was achieved with electrocautery. Subcutaneous and epidermal tissues were approximated with the above sutures. The surgical site was then lightly scrubbed with sterile, saline-soaked gauze. The area was then bandaged using Vaseline ointment, non-adherent gauze, gauze pads, and tape to provide an adequate pressure dressing. The patient tolerated the procedure well, was given detailed written and verbal wound care instructions, and was discharged in good condition.   The patient will follow-up: 4 weeks.   Documentation: I have reviewed the above documentation for accuracy and completeness, and I agree with the above.  [name], MD

## 2024-02-14 DIAGNOSIS — M9905 Segmental and somatic dysfunction of pelvic region: Secondary | ICD-10-CM | POA: Diagnosis not present

## 2024-02-14 DIAGNOSIS — M4306 Spondylolysis, lumbar region: Secondary | ICD-10-CM | POA: Diagnosis not present

## 2024-02-14 DIAGNOSIS — M5417 Radiculopathy, lumbosacral region: Secondary | ICD-10-CM | POA: Diagnosis not present

## 2024-02-14 DIAGNOSIS — J8283 Eosinophilic asthma: Secondary | ICD-10-CM | POA: Insufficient documentation

## 2024-02-14 DIAGNOSIS — M9903 Segmental and somatic dysfunction of lumbar region: Secondary | ICD-10-CM | POA: Diagnosis not present

## 2024-02-14 DIAGNOSIS — I10 Essential (primary) hypertension: Secondary | ICD-10-CM | POA: Diagnosis not present

## 2024-02-14 DIAGNOSIS — G4733 Obstructive sleep apnea (adult) (pediatric): Secondary | ICD-10-CM | POA: Diagnosis not present

## 2024-02-14 DIAGNOSIS — Z79899 Other long term (current) drug therapy: Secondary | ICD-10-CM | POA: Diagnosis not present

## 2024-02-14 DIAGNOSIS — J4530 Mild persistent asthma, uncomplicated: Secondary | ICD-10-CM | POA: Diagnosis not present

## 2024-02-14 DIAGNOSIS — E65 Localized adiposity: Secondary | ICD-10-CM | POA: Diagnosis not present

## 2024-02-16 ENCOUNTER — Encounter: Payer: Self-pay | Admitting: Dermatology

## 2024-02-22 ENCOUNTER — Ambulatory Visit

## 2024-02-22 DIAGNOSIS — L814 Other melanin hyperpigmentation: Secondary | ICD-10-CM

## 2024-02-22 DIAGNOSIS — L570 Actinic keratosis: Secondary | ICD-10-CM | POA: Diagnosis not present

## 2024-02-22 DIAGNOSIS — L578 Other skin changes due to chronic exposure to nonionizing radiation: Secondary | ICD-10-CM

## 2024-02-22 DIAGNOSIS — D1801 Hemangioma of skin and subcutaneous tissue: Secondary | ICD-10-CM | POA: Diagnosis not present

## 2024-02-22 DIAGNOSIS — Z1283 Encounter for screening for malignant neoplasm of skin: Secondary | ICD-10-CM

## 2024-02-22 DIAGNOSIS — Z85828 Personal history of other malignant neoplasm of skin: Secondary | ICD-10-CM | POA: Diagnosis not present

## 2024-02-22 DIAGNOSIS — D229 Melanocytic nevi, unspecified: Secondary | ICD-10-CM

## 2024-02-22 DIAGNOSIS — L821 Other seborrheic keratosis: Secondary | ICD-10-CM

## 2024-02-22 DIAGNOSIS — W908XXA Exposure to other nonionizing radiation, initial encounter: Secondary | ICD-10-CM

## 2024-02-22 DIAGNOSIS — C4490 Unspecified malignant neoplasm of skin, unspecified: Secondary | ICD-10-CM

## 2024-02-22 NOTE — Patient Instructions (Addendum)
 Cryosurgery  Cryosurgery ("freezing") uses liquid nitrogen to destroy certain types of skin lesions. Lowering the temperature of the lesion in a small area surrounding skin destroys the lesion. Immediately following cryosurgery, you will notice redness and swelling of the treatment area. Blistering or weeping may occur, lasting approximately one week which will then be followed by crusting. Most areas will heal completely in 10 to 14 days.  Wash the treated areas daily. Allow soap and water to run over the areas, but do not scrub. Should a scab or crust form, allow it to fall off on its own. Do not remove or pick at it. Application of an ointment  and a bandage may make you feel more comfortable, but it is not necessary. Some people develop an allergy to Neosporin, so we recommend that Vaseline or  Aquaphor be used.  The cryotherapy site will be more sensitive than your surrounding skin. Keep it covered, and remember to apply sunscreen every day to all your sun exposed skin. A scar may remain which is lighter or pinker than your normal skin. Your body will continue to improve your scar for up to one year; however a light-colored scar may remain.  Infection following cryotherapy is rare. However if you are worried about the appearance of the treated area, contact your doctor. We have a physician on call at all times. If you have any concerns about the site, please call our clinic at [phone]    Sunscreen  Who needs sunscreen? Everyone. Sunscreen use can help prevent skin cancer by protecting you from the sun's harmful ultraviolet rays. Anyone can get skin cancer, regardless of age, gender or race. In fact, it is estimated that one in five Americans will develop skin cancer in their lifetime.  Sunscreen alone cannot fully protect you. In addition to wearing sunscreen, dermatologists recommend taking the following steps to protect your skin and find skin cancer early:  Seek shade when appropriate,  remembering that the sun's rays are strongest between 10 a.m. and 2 p.m. If your shadow is shorter than you are, seek shade. Dress to protect yourself from the sun by wearing a lightweight long-sleeved shirt, pants, a wide-brimmed hat and sunglasses, when possible.  Use extra caution near water, snow and sand as they reflect the damaging rays of the sun, which can increase your chance of sunburn.  Get vitamin D safely through a healthy diet that may include vitamin supplements. Don't seek the sun. Avoid tanning beds. Ultraviolet light from the sun and tanning beds can cause skin cancer and wrinkling. If you want to look tan, you may wish to use a self-tanning product, but continue to use sunscreen with it.  When should I use sunscreen? Every day you go outside--even if you're just walking to and from your form of transportation. The sun emits harmful UV rays year-round. Even on cloudy days, up to 80 percent of the sun's harmful UV rays can penetrate your skin. Snow, sand and water increase the need for sunscreen because they reflect the sun's rays.  How much sunscreen should I use, and how often should I apply it? Most people only apply 25-50 percent of the recommended amount of sunscreen. Apply enough sunscreen to cover all exposed skin. Most adults need about 1 ounce -- or enough to fill a shot glass -- to fully cover their body.  Don't forget to apply to the tops of your feet, your neck, your ears and the top of your head. Apply sunscreen to dry  skin 15 minutes before going outdoors.  Skin cancer also can form on the lips. To protect your lips, apply a lip balm or lipstick that contains sunscreen with an SPF of 30 or higher.  When outdoors, reapply sunscreen approximately every two hours, or after swimming or sweating, according to the directions on the bottle.   Broad-spectrum sunscreens protect against both UVA and UVB rays. What is the difference between the rays? Sunlight consists of two types  of harmful rays that reach the earth -- UVA rays and UVB rays. Overexposure to either can lead to skin cancer. In addition to causing skin cancer, here's what each of these rays do:  UVA rays (or aging rays) can prematurely age your skin, causing wrinkles and age spots, and can pass through window glass. UVB rays (or burning rays) are the primary cause of sunburn and are blocked by window glass  There is no safe way to tan. Every time you tan, you damage your skin. As this damage builds, you speed up the aging of your skin and increase your risk for all types of skin cancer.  What is the difference between chemical and physical sunscreens? Chemical sunscreens work like a sponge, absorbing the sun's rays. They contain one or more of the following active ingredients: oxybenzone, avobenzone, octisalate, octocrylene, homosalate and octinoxate. These formulations tend to be easier to rub into the skin without leaving a white residue.   Physical sunscreens work like a shield, sitting sit on the surface of your skin and deflecting the sun's rays. They contain the active ingredients zinc oxide and/or titanium dioxide. Use this sunscreen if you have sensitive skin.   What type of sunscreen should I use? The best type of sunscreen is the one you will use again and again. Just make sure it offers broad-spectrum (UVA and UVB) protection, has an SPF of 30+, and is water-resistant. The kind of sunscreen you use is a matter of personal choice, and may vary depending on the area of the body to be protected. Available sunscreen options include lotions, creams, gels, ointments, wax sticks and sprays.  Recommended physical sunscreens for face: - Neutrogena Sheer Zinc - Aveeno Positively Mineral Sensitive - CeraVe Hydrating Mineral (also has a tinted version) - La Roche-Posay Anthelios Mineral Face (comes as a cream, lotion, light fluid, and there is also a tinted version).  - EltaMD UV Clear (also has a tinted  version)  Recommended physical sunscreens for body: - Neutrogena Sheer Zinc Dry-Touch Sunscreen Sensitive Skin Lotion Broad Spectrum SPF 50 - Aveeno Positively Mineral Sensitive Skin Sunscreen Broad Spectrum SPF 50 - La Roche-Posay Anthelios SPF 50 Mineral Sunscreen - Gentle Lotion - CeraVe Hydrating Mineral Sunscreen SPF 50  Recommended chemical sunscreens for face: - Anthelios UV Correct Face Sunscreen SPF 70 with Niacinamide - Neutrogena Clear Face Oil-Free SPF 50 with Helioplex - Neutrogena Sport Face Oil-Free SPF 70+ with Helioplex - Aveeno Protect + Hydrate Sunscreen For Face SPF 70 - La Roche-Posay Anthelios Light Fluid Sunscreen for Face SPF 60  Recommended chemical sunscreens for body: - Neutrogena Ultra Sheer Dry-Touch Sunscreen SPF 70 - Aveeno Protect + Hydrate Broad Spectrum All-Day Hydration SPF 60 (comes in a big pump) - La Roche-Posay Anthelios Melt-In Milk Sunscreen SPF 60  Melanoma ABCDEs  Melanoma is the most dangerous type of skin cancer, and is the leading cause of death from skin disease.  You are more likely to develop melanoma if you: Have light-colored skin, light-colored eyes, or red or blond  hair Spend a lot of time in the sun Tan regularly, either outdoors or in a tanning bed Have had blistering sunburns, especially during childhood Have a close family member who has had a melanoma Have atypical moles or large birthmarks  Early detection of melanoma is key since treatment is typically straightforward and cure rates are extremely high if we catch it early.   The first sign of melanoma is often a change in a mole or a new dark spot.  The ABCDE system is a way of remembering the signs of melanoma.  A for asymmetry:  The two halves do not match. B for border:  The edges of the growth are irregular. C for color:  A mixture of colors are present instead of an even brown color. D for diameter:  Melanomas are usually (but not always) greater than 6mm - the size  of a pencil eraser. E for evolution:  The spot keeps changing in size, shape, and color.  Please check your skin once per month between visits. You can use a small mirror in front and a large mirror behind you to keep an eye on the back side or your body.   If you see any new or changing lesions before your next follow-up, please call to schedule a visit.  Please continue daily skin protection including broad spectrum sunscreen SPF 30+ to sun-exposed areas, reapplying every 2 hours as needed when you're outdoors.   Staying in the shade or wearing long sleeves, sun glasses (UVA+UVB protection) and wide brim hats (4-inch brim around the entire circumference of the hat) are also recommended for sun protection.   Due to recent changes in healthcare laws, you may see results of your pathology and/or laboratory studies on MyChart before the doctors have had a chance to review them. We understand that in some cases there may be results that are confusing or concerning to you. Please understand that not all results are received at the same time and often the doctors may need to interpret multiple results in order to provide you with the best plan of care or course of treatment. Therefore, we ask that you please give us  2 business days to thoroughly review all your results before contacting the office for clarification. Should we see a critical lab result, you will be contacted sooner.   If You Need Anything After Your Visit  If you have any questions or concerns for your doctor, please call our main line at [phone] and press option 4 to reach your doctor's medical assistant. If no one answers, please leave a voicemail as directed and we will return your call as soon as possible. Messages left after 4 pm will be answered the following business day.   You may also send us  a message via MyChart. We typically respond to MyChart messages within 1-2 business days.  For prescription refills, please ask  your pharmacy to contact our office. Our fax number is [phone].  If you have an urgent issue when the clinic is closed that cannot wait until the next business day, you can page your doctor at the number below.    Please note that while we do our best to be available for urgent issues outside of office hours, we are not available 24/7.   If you have an urgent issue and are unable to reach us , you may choose to seek medical care at your doctor's office, retail clinic, urgent care center, or emergency room.  If you have  a medical emergency, please immediately call 911 or go to the emergency department.  Pager Numbers  - Dr. Hester: [phone]  - Dr. Jackquline: [phone]  - Dr. Claudene: [phone]   In the event of inclement weather, please call our main line at [phone] for an update on the status of any delays or closures.  Dermatology Medication Tips: Please keep the boxes that topical medications come in in order to help keep track of the instructions about where and how to use these. Pharmacies typically print the medication instructions only on the boxes and not directly on the medication tubes.   If your medication is too expensive, please contact our office at [phone] option 4 or send us  a message through MyChart.   We are unable to tell what your co-pay for medications will be in advance as this is different depending on your insurance coverage. However, we may be able to find a substitute medication at lower cost or fill out paperwork to get insurance to cover a needed medication.   If a prior authorization is required to get your medication covered by your insurance company, please allow us  1-2 business days to complete this process.  Drug prices often vary depending on where the prescription is filled and some pharmacies may offer cheaper prices.  The website www.goodrx.com contains coupons for medications through different pharmacies. The prices here do not  account for what the cost may be with help from insurance (it may be cheaper with your insurance), but the website can give you the price if you did not use any insurance.  - You can print the associated coupon and take it with your prescription to the pharmacy.  - You may also stop by our office during regular business hours and pick up a GoodRx coupon card.  - If you need your prescription sent electronically to a different pharmacy, notify our office through [facility] or by phone at [phone] option 4.     Si Usted Necesita Algo Despus de Su Visita  Tambin puede enviarnos un mensaje a travs de Clinical cytogeneticist. Por lo general respondemos a los mensajes de MyChart en el transcurso de 1 a 2 das hbiles.  Para renovar recetas, por favor pida a su farmacia que se ponga en contacto con nuestra oficina. Randi lakes de fax es [place] [phone].  Si tiene un asunto urgente cuando la clnica est cerrada y que no puede esperar hasta el siguiente da hbil, puede llamar/localizar a su doctor(a) al nmero que aparece a continuacin.   Por favor, tenga en cuenta que aunque hacemos todo lo posible para estar disponibles para asuntos urgentes fuera del horario de [place], no estamos disponibles las 24 horas del da, los 7 [address]  [address] de la [place].   Si tiene un problema urgente y no puede comunicarse con nosotros, puede optar por buscar atencin mdica  en el consultorio de su doctor(a), en una clnica privada, en un centro de atencin urgente o en una sala de emergencias.  Si tiene Engineer, drilling, por favor llame inmediatamente al 911 o vaya a la sala de emergencias.  Nmeros de bper  - Dr. Hester: [phone]  - Dra. Jackquline: [phone]  - Dr. Claudene: [phone]   En caso de inclemencias del tiempo, por favor llame a landry capes principal al [phone] para una actualizacin sobre el [place] de cualquier retraso o cierre.  Consejos para la medicacin en dermatologa: Por  favor, guarde las cajas en las que vienen los medicamentos de [place]  tpico para ayudarle a seguir las instrucciones sobre dnde y cmo usarlos. Las farmacias generalmente imprimen las instrucciones del medicamento slo en las cajas y no directamente en los tubos del [place].   Si su medicamento es muy caro, por favor, pngase en contacto con landry rieger llamando al [phone] y presione la opcin 4 o envenos un mensaje a travs de Clinical cytogeneticist.   No podemos decirle cul ser su copago por los medicamentos por adelantado ya que esto es diferente dependiendo de la cobertura de su seguro. Sin embargo, es posible que podamos encontrar un medicamento sustituto a Audiological scientist un formulario para que el seguro cubra el medicamento que se considera necesario.   Si se requiere una autorizacin previa para que su compaa de seguros malta su medicamento, por favor permtanos de 1 a 2 das hbiles para completar este proceso.  Los precios de los medicamentos varan con frecuencia dependiendo del Environmental consultant de dnde se surte la receta y alguna farmacias pueden ofrecer precios ms baratos.  El sitio web www.goodrx.com tiene cupones para medicamentos de Health and safety inspector. Los precios aqu no tienen en cuenta lo que podra costar con la ayuda del seguro (puede ser ms barato con su seguro), pero el sitio web puede darle el precio si no utiliz Tourist information centre manager.  - Puede imprimir el cupn correspondiente y llevarlo con su receta a la farmacia.  - Tambin puede pasar por nuestra oficina durante el horario de atencin regular y Education officer, museum una tarjeta de cupones de GoodRx.  - Si necesita que su receta se enve electrnicamente a una farmacia diferente, informe a nuestra oficina a travs de MyChart de [place] o por telfono llamando al [phone] y presione la opcin 4.

## 2024-02-22 NOTE — Progress Notes (Signed)
 Subjective   Alexander Fitzgerald is a 78 y.o. male who presents for the following: Total body skin exam for skin cancer screening and mole check. The patient has spots, moles and lesions to be evaluated, some may be new or changing and the patient may have concern these could be cancer. Patient is established patient .  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  The following portions of the chart were reviewed this encounter and updated as appropriate: medications, allergies, medical history  Relevant Medical History:  Personal history of non melanoma skin cancer - see medical history for full details  and Personal history of actinic keratosis   Objective  Well appearing patient in no apparent distress; mood and affect are within normal limits. Examination was performed of the: Waist Up Skin Exam: scalp, head, eyes, ears, nose, lips, neck, chest, axillae, upper extremities, abdomen, back, hands, fingers, fingernails   Examination notable for: SKIN EXAM, Angioma(s): Scattered red vascular papule(s)  , Lentigo/lentigines: Scattered pigmented macules that are tan to brown in color and are somewhat non-uniform in shape and concentrated in the sun-exposed areas, Nevus/nevi: Scattered well-demarcated, regular, pigmented macule(s) and/or papule(s)  , Seborrheic Keratosis(es): Stuck-on appearing keratotic papule(s) on the trunk, none  irritated with redness, crusting, edema, and/or partial avulsion, Actinic Damage/Elastosis: chronic sun damage: dyspigmentation, telangiectasia, and wrinkling, Actinic keratosis: Scaly erythematous macule(s) concentrated on sun exposed areas   Examination limited by: Undergarments, Shoes or socks , Clothing, and Patient deferred removal  .  Scalp/face x8 (8) Pink scaly macules  Assessment & Plan   SKIN CANCER SCREENING PERFORMED TODAY.  BENIGN SKIN FINDINGS  -  Lentigines  - Seborrheic keratoses  - Hemangiomas   - Nevus/Multiple Benign Nevi  - Reassurance provided regarding the benign appearance of lesions noted on exam today; no treatment is indicated in the absence of symptoms/changes. - Reinforced importance of photoprotective strategies including liberal and frequent sunscreen use of a broad-spectrum SPF 30 or greater, use of protective clothing, and sun avoidance for prevention of cutaneous malignancy and photoaging.  Counseled patient on the importance of regular self-skin monitoring as well as routine clinical skin examinations as scheduled.   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 This is a chronic with expected duration over 1 year. A portion of actinic keratoses will progress to squamous cell carcinoma of the skin. It is not possible to reliably predict which spots will progress to skin cancer and so treatment is recommended to prevent development of skin cancer. - Reviewed sun avoidance practices, including protective clothing and hat, midday sun avoidance, sunscreen SPF>30 applications Q2-3H when in sun especially after sweating - Reviewed signs/symptoms of skin cancer, patient asked come in sooner if these appear. - Discussed efudex - deferred  - tx w cryo as per below   Personal history of non melanoma skin cancer  and actinic keratosis  - Reviewed medical history for full details  - Reviewed sun protective measures as above - Encouraged full body skin exams     Level of service outlined above   Procedures, orders, diagnosis for this visit:  AK (ACTINIC KERATOSIS) (8) Scalp/face x8 (8) Actinic keratoses are precancerous spots that appear secondary to cumulative UV radiation exposure/sun exposure over time. They are chronic with expected duration over 1 year. A portion of actinic  keratoses will progress to squamous cell carcinoma of the skin. It is not possible to reliably  predict which spots will progress to skin cancer and so treatment is recommended to prevent development of skin cancer.  Recommend daily broad spectrum sunscreen SPF 30+ to sun-exposed areas, reapply every 2 hours as needed.  Recommend staying in the shade or wearing long sleeves, sun glasses (UVA+UVB protection) and wide brim hats (4-inch brim around the entire circumference of the hat). Call for new or changing lesions. Destruction of lesion - Scalp/face x8 (8) Complexity: simple   Destruction method: cryotherapy   Informed consent: discussed and consent obtained   Timeout:  patient name, date of birth, surgical site, and procedure verified Lesion destroyed using liquid nitrogen: Yes   Region frozen until ice ball extended beyond lesion: Yes   Cryo cycles: 1 or 2. Outcome: patient tolerated procedure well with no complications   Post-procedure details: wound care instructions given   Additional details:  Prior to procedure, discussed risks of blister formation, small wound, skin dyspigmentation, or rare scar following cryotherapy. Recommend Vaseline ointment to treated areas while healing.    AK (actinic keratosis) -     Destruction of lesion   Return to clinic: Return in about 6 months (around 08/21/2024) for w/ Dr. Raymund, HxBCC, TBSE.  I, Jacquelynn V. Wilfred, CMA, am acting as scribe for Lauraine JAYSON Raymund, MD.  Documentation: I have reviewed the above documentation for accuracy and completeness, and I agree with the above.  Lauraine JAYSON Raymund, MD

## 2024-02-27 ENCOUNTER — Ambulatory Visit: Admitting: Podiatry

## 2024-02-27 ENCOUNTER — Encounter: Payer: Self-pay | Admitting: Podiatry

## 2024-02-27 ENCOUNTER — Telehealth: Payer: Self-pay

## 2024-02-27 DIAGNOSIS — M79674 Pain in right toe(s): Secondary | ICD-10-CM | POA: Diagnosis not present

## 2024-02-27 DIAGNOSIS — M79675 Pain in left toe(s): Secondary | ICD-10-CM

## 2024-02-27 DIAGNOSIS — B351 Tinea unguium: Secondary | ICD-10-CM

## 2024-02-27 NOTE — Telephone Encounter (Signed)
 Alexander Fitzgerald Alexander Fitzgerald  P [facility] Admin11 minutes ago (1:03 PM)    Appointment canceled for [name] ([id number]) Visit type: MEDICARE AWV, SEQUENTIAL 02/28/2024 10:50 AM (40 minutes) with [facility] ANNUAL WELLNESS VISIT in [facility]   Reason for cancellation: Canceled via MyChart   Patient comments: I am attending a lecture at [employer] with a lunch,  can we do after 3 PM?

## 2024-02-28 NOTE — Telephone Encounter (Signed)
 I sent MyChart message to patient. I rescheduled his appointment to 02/29/2024 at 2:20. I asked him if this isn't a good time for him, to let me know.

## 2024-02-29 ENCOUNTER — Ambulatory Visit

## 2024-03-05 ENCOUNTER — Ambulatory Visit: Admitting: *Deleted

## 2024-03-05 ENCOUNTER — Telehealth: Payer: Self-pay | Admitting: *Deleted

## 2024-03-05 ENCOUNTER — Ambulatory Visit: Admitting: Dermatology

## 2024-03-05 ENCOUNTER — Encounter: Payer: Self-pay | Admitting: Dermatology

## 2024-03-05 ENCOUNTER — Other Ambulatory Visit: Payer: Self-pay

## 2024-03-05 VITALS — Ht 70.0 in | Wt 209.0 lb

## 2024-03-05 DIAGNOSIS — Z0000 Encounter for general adult medical examination without abnormal findings: Secondary | ICD-10-CM

## 2024-03-05 DIAGNOSIS — L905 Scar conditions and fibrosis of skin: Secondary | ICD-10-CM

## 2024-03-05 DIAGNOSIS — Z85828 Personal history of other malignant neoplasm of skin: Secondary | ICD-10-CM | POA: Diagnosis not present

## 2024-03-05 DIAGNOSIS — C4491 Basal cell carcinoma of skin, unspecified: Secondary | ICD-10-CM

## 2024-03-05 DIAGNOSIS — I10 Essential (primary) hypertension: Secondary | ICD-10-CM

## 2024-03-05 MED ORDER — LISINOPRIL 10 MG PO TABS: 10.0000 mg | ORAL_TABLET | Freq: Every day | ORAL | 3 refills | Status: DC

## 2024-03-05 NOTE — Telephone Encounter (Addendum)
 Performed AWV.  Patient stated that he has a transfer of care appointment 04/13/24 with his new PCP. Patient stated that he really does not want to wait that long until he has some lab work done. Patient stated that he has had problems with his thyroid  and would like it checked again along with liver and kidney functions. Patient stated that he has had some adjustments made to some of his medications and new ones added. Patient stated that he would like to have these labs done so that they will be back and he is able to discuss with his new PCP at his upcoming appointment.SABRA

## 2024-03-05 NOTE — Progress Notes (Signed)
   Follow Up Visit   Subjective  Alexander Fitzgerald is a 78 y.o. male who presents for the following: follow up from Mohs surgery   The patient presents for follow up from Mohs surgery for a  BCC on the right preauricular area, treated on 02/06/24, repaired with linear closure. The patient has been bandaging the wound as directed. The endorse the following concerns: none  The following portions of the chart were reviewed this encounter and updated as appropriate: medications, allergies, medical history  Review of Systems:  No other skin or systemic complaints except as noted in HPI or Assessment and Plan.  Objective  Well appearing patient in no apparent distress; mood and affect are within normal limits.  A focal examination was performed including scalp, head, face.  All findings within normal limits unless otherwise noted below.  Healing wound with mild erythema  Relevant physical exam findings are noted in the Assessment and Plan.    Assessment & Plan   Scar s/p Mohs for [place] on the right preauricular scalp/cheek, treated on 02/06/24, repaired with linear closure - Reassured that wound is healing well - No evidence of infection - No swelling, induration, purulence, dehiscence, or tenderness out of proportion to the clinical exam, see photo above - Discussed that scars take up to 12 months to mature from the date of surgery - Recommend SPF 30+ to scar daily to prevent purple color from UV exposure during scar maturation process - Discussed that erythema and raised appearance of scar will fade over the next 4-6 months - OK to start scar massage at 4-6 weeks post-op - Can consider silicone based products for scar healing starting at 6 weeks post-op  HISTORY OF BASAL CELL CARCINOMA OF THE SKIN - No evidence of recurrence today - Recommend regular full body skin exams - Recommend daily broad spectrum sunscreen SPF 30+ to sun-exposed areas, reapply every 2 hours as needed.  - Call if  any new or changing lesions are noted between office visits  Return if symptoms worsen or fail to improve.  I, Darice Smock, CMA, am acting as scribe for [name], MD.   Documentation: I have reviewed the above documentation for accuracy and completeness, and I agree with the above.  [name], MD

## 2024-03-05 NOTE — Patient Instructions (Signed)
 [name],  Thank you for taking the time for your Medicare Wellness Visit. I appreciate your continued commitment to your health goals. Please review the care plan we discussed, and feel free to reach out if I can assist you further.  Please note that Annual Wellness Visits do not include a physical exam. Some assessments may be limited, especially if the visit was conducted virtually. If needed, we may recommend an in-person follow-up with your provider.  Ongoing Care Seeing your primary care provider every 3 to 6 months helps us  monitor your health and provide consistent, personalized care.  Consider updating your covid vaccine.  Referrals If a referral was made during today's visit and you haven't received any updates within two weeks, please contact the referred provider directly to check on the status.  Recommended Screenings:  Health Maintenance  Topic Date Due   COVID-19 Vaccine (5 - 2025-26 season) 12/19/2023   Medicare Annual Wellness Visit  03/05/2025   DTaP/Tdap/Td vaccine (3 - Td or Tdap) 02/14/2033   Pneumococcal Vaccine for age over 61  Completed   Flu Shot  Completed   Hepatitis C Screening  Completed   Zoster (Shingles) Vaccine  Completed   Meningitis B Vaccine  Aged Out   Colon Cancer Screening  Discontinued       03/05/2024    9:00 AM  Advanced Directives  Does Patient Have a Medical Advance Directive? Yes  Type of Estate Agent of [place];Living will  Does patient want to make changes to medical advance directive? No - Patient declined  Copy of Healthcare Power of Attorney in Chart? No - copy requested    Vision: Annual vision screenings are recommended for early detection of glaucoma, cataracts, and diabetic retinopathy. These exams can also reveal signs of chronic conditions such as diabetes and high blood pressure.  Dental: Annual dental screenings help detect early signs of oral cancer, gum disease, and other conditions linked  to overall health, including heart disease and diabetes.  Please see the attached documents for additional preventive care recommendations.

## 2024-03-05 NOTE — Progress Notes (Signed)
  Subjective:  Patient ID: Alexander Fitzgerald, male    DOB: [birth year],  MRN: [id number]  ADLER CHARTRAND presents to clinic today for painful mycotic toenails of both feet that are difficult to trim. Pain interferes with daily activities and wearing enclosed shoe gear comfortably. Patient has h/o gout right great toe. Chief Complaint  Patient presents with   Toe Pain    Dr. Abbey is his PCP. He has not seen her yet. He denies being diabetic   New problem(s): None.   PCP is Bair, Kalpana, MD.  Allergies  Allergen Reactions   Sulfa Drugs Cross Reactors Other (See Comments)    Urinary bleeding    Review of Systems: Negative except as noted in the HPI.  Objective: No changes noted in today's physical examination. There were no vitals filed for this visit. Alexander Fitzgerald is a pleasant 78 y.o. male WD, WN in NAD. AAO x 3.  Vascular Examination: Vascular status intact b/l with palpable pedal pulses. CFT immediate b/l. Pedal hair present. No edema. No pain with calf compression b/l. Skin temperature gradient WNL b/l. No varicosities noted. No cyanosis or clubbing noted.  Neurological Examination: Sensation grossly intact b/l with 10 gram monofilament. Vibratory sensation intact b/l.  Dermatological Examination: Pedal skin with normal turgor, texture and tone b/l. No open wounds nor interdigital macerations noted. Toenails 1-5 b/l thick, discolored, elongated with subungual debris and pain on dorsal palpation. No hyperkeratotic lesions noted b/l.   Musculoskeletal Examination: Muscle strength 5/5 to b/l LE.  No pain, crepitus noted b/l. No gross pedal deformities. Patient ambulates independently without assistive aids.   Radiographs: None  Assessment/Plan: 1. Pain due to onychomycosis of toenails of both feet   Consent given for treatment. Patient examined. All patient's and/or POA's questions/concerns addressed on today's visit. Toenails 1-5 b/l debrided in length and girth  without incident. Continue foot and shoe inspections daily. Monitor blood glucose per PCP/Endocrinologist's recommendations. Continue soft, supportive shoe gear daily. Report any pedal injuries to medical professional. Call office if there are any questions/concerns. -Patient/POA to call should there be question/concern in the interim.   Return in about 3 months (around 05/29/2024).  Delon LITTIE Merlin, DPM      [place] LOCATION: [address] [place] [address]                   Office [phone]   [facility] LOCATION: [address] [place], [place] [address] Office [phone]

## 2024-03-05 NOTE — Progress Notes (Signed)
 Chief Complaint  Patient presents with   Medicare Wellness     Subjective:   Alexander Fitzgerald is a 78 y.o. male who presents for a Medicare Annual Wellness Visit.  Allergies (verified) Sulfa drugs cross reactors   History: Past Medical History:  Diagnosis Date   Asthma    adult onset, PFTs done in [place]   Basal cell carcinoma 11/25/2022   Left Parietal Scalp, [facility]   BCC (basal cell carcinoma) 01/03/2024   R preauricular, tx Dr. Corey 02/06/2024   Cancer [facility])    Several moles removed in past, all pre-cancerous.  Recently followed by Dr. Hester   Depression    Treated with Prozac in 1992,  Associate with job loss   Esophageal reflux    Gout    Hiatal hernia    Hypertension    Squamous cell carcinoma of skin 07/30/2015   R anterior lat neck, Atypical squamous proliferation, excised 08/28/2015   Past Surgical History:  Procedure Laterality Date   APPENDECTOMY     COLONOSCOPY N/A 12/27/2014   Procedure: COLONOSCOPY;  Surgeon: Gladis Donevin Mariner, MD;  Location: [facility] ENDOSCOPY;  Service: Endoscopy;  Laterality: N/A;   LAPAROSCOPIC PARAESOPHAGEAL HERNIA REPAIR     SKIN CANCER EXCISION  01/2024   TONSILLECTOMY     TOTAL HIP ARTHROPLASTY Right 04/2023   Family History  Problem Relation Age of Onset   Hypertension Mother    Hyperlipidemia Mother    Diabetes Mother    Heart attack Father 24       MI   Hypertension Father    Cancer Sister        pancreatic   Social History   Occupational History   Not on file  Tobacco Use   Smoking status: Former    Current packs/day: 0.00    Average packs/day: 0.5 packs/day for 20.0 years (10.0 ttl pk-yrs)    Types: Cigarettes    Start date: 06/15/1979    Quit date: 06/15/1999    Years since quitting: 24.7   Smokeless tobacco: Never  Substance and Sexual Activity   Alcohol use: Not Currently    Alcohol/week: 1.0 standard drink of alcohol    Types: 1 Standard drinks or equivalent per week    Comment: rarely   Drug use: No    Sexual activity: Yes   Tobacco Counseling Counseling given: Not Answered  SDOH Screenings   Food Insecurity: No Food Insecurity (03/04/2024)  Housing: Low Risk  (03/04/2024)  Transportation Needs: No Transportation Needs (03/04/2024)  Utilities: Not At Risk (03/05/2024)  Alcohol Screen: Low Risk  (03/04/2024)  Depression (PHQ2-9): Low Risk  (03/05/2024)  Financial Resource Strain: Low Risk  (03/04/2024)  Physical Activity: Sufficiently Active (03/04/2024)  Social Connections: Socially Integrated (03/04/2024)  Stress: No Stress Concern Present (03/04/2024)  Tobacco Use: Medium Risk (03/05/2024)  Health Literacy: Adequate Health Literacy (03/05/2024)   See flowsheets for full screening details  Depression Screen PHQ 2 & 9 Depression Scale- Over the past 2 weeks, how often have you been bothered by any of the following problems? Little interest or pleasure in doing things: 0 Feeling down, depressed, or hopeless (PHQ Adolescent also includes...irritable): 0 PHQ-2 Total Score: 0 Trouble falling or staying asleep, or sleeping too much: 0 Feeling tired or having little energy: 1 Poor appetite or overeating (PHQ Adolescent also includes...weight loss): 0 Feeling bad about yourself - or that you are a failure or have let yourself or your family down: 0 Trouble concentrating on things, such as  reading the newspaper or watching television [facility] Adolescent also includes...like school work): 0 Moving or speaking so slowly that other people could have noticed. Or the opposite - being so fidgety or restless that you have been moving around a lot more than usual: 0 Thoughts that you would be better off dead, or of hurting yourself in some way: 0 PHQ-9 Total Score: 1 If you checked off any problems, how difficult have these problems made it for you to do your work, take care of things at home, or get along with other people?: Not difficult at all     Goals Addressed             This Visit's  Progress    Patient Stated       Wants to lose some weight       Visit info / Clinical Intake: Medicare Wellness Visit Type:: Subsequent Annual Wellness Visit Persons participating in visit:: patient Medicare Wellness Visit Mode:: Telephone If telephone:: video declined Because this visit was a virtual/telehealth visit:: pt reported vitals If Telephone or Video please confirm:: I connected with the patient using audio enabled telemedicine application and verified that I am speaking with the correct person using two identifiers; I discussed the limitations of evaluation and management by telemedicine; The patient expressed understanding and agreed to proceed Patient Location:: Home Provider Location:: Office/Clinic Information given by:: patient Interpreter Needed?: No Pre-visit prep was completed: yes AWV questionnaire completed by patient prior to visit?: yes Date:: 03/04/24 Living arrangements:: lives with spouse/significant other [name] Overall Health Status Rating: good Typical amount of pain: some Does pain affect daily life?: no Are you currently prescribed opioids?: no  Dietary Habits and Nutritional Risks How many meals a day?: 3 Eats fruit and vegetables daily?: yes Most meals are obtained by: preparing own meals In the last 2 weeks, have you had any of the following?: none Diabetic:: no  Functional Status Activities of Daily Living (to include ambulation/medication): Independent Ambulation: Independent Medication Administration: Independent Home Management: Independent Manage your own finances?: yes Primary transportation is: driving Concerns about vision?: (!) yes Concerns about hearing?: (!) yes Uses hearing aids?: (!) yes Hear whispered voice?: -- (televisit)  Fall Screening Falls in the past year?: 0 Number of falls in past year: 0 Was there an injury with Fall?: 0 Fall Risk Category Calculator: 0 Patient Fall Risk Level: Low Fall Risk  Fall  Risk Patient at Risk for Falls Due to: No Fall Risks Fall risk Follow up: Falls evaluation completed; Falls prevention discussed  Home and Transportation Safety: All rugs have non-skid backing?: yes All stairs or steps have railings?: yes Grab bars in the bathtub or shower?: (!) no Have non-skid surface in bathtub or shower?: yes Good home lighting?: yes Regular seat belt use?: yes Hospital stays in the last year:: (!) yes How many hospital stays:: 1 Reason: hip surgery  Cognitive Assessment Difficulty concentrating, remembering, or making decisions? : no Will 6CIT or Mini Cog be Completed: yes What year is it?: 0 points What month is it?: 0 points Give patient an address phrase to remember (5 components): [address] [place] About what time is it?: 0 points Count backwards from 20 to 1: 0 points Say the months of the year in reverse: 0 points Repeat the address phrase from earlier: 0 points 6 CIT Score: 0 points  Advance Directives (For Healthcare) Does Patient Have a Medical Advance Directive?: Yes Does patient want to make changes to medical advance directive?:  No - Patient declined Type of Advance Directive: Healthcare Power of [place]; Living will Copy of Healthcare Power of Attorney in Chart?: No - copy requested Copy of Living Will in Chart?: No - copy requested  Reviewed/Updated  Reviewed/Updated: Reviewed All (Medical, Surgical, Family, Medications, Allergies, Care Teams, Patient Goals)        Objective:    Today's Vitals   03/05/24 0852  Weight: 209 lb (94.8 kg)  Height: 5' 10 (1.778 m)   Body mass index is 29.99 kg/m.  Current Medications (verified) Outpatient Encounter Medications as of 03/05/2024  Medication Sig   albuterol  (VENTOLIN  HFA) 108 (90 Base) MCG/ACT inhaler INHALE 2 PUFFS 4 TIMES A DAY AS NEEDED   Ascorbic Acid (VITAMIN C) 1000 MG tablet Take 1,000 mg by mouth daily. Reported on 06/26/2015   aspirin  EC 81 MG tablet Take 81 mg by  mouth daily. Swallow whole.   Cholecalciferol (VITAMIN D3) 2000 units capsule Take 4,000 Units by mouth.   colchicine  0.6 MG tablet TAKE 1 TABLET BY MOUTH EVERY DAY   Cyanocobalamin (VITAMIN B-12) 5000 MCG SUBL Place 2 each under the tongue daily.   cyclobenzaprine  (FLEXERIL ) 10 MG tablet Take 1 tablet (10 mg total) by mouth 3 (three) times daily as needed for muscle spasms.   Dapsone 5 % topical gel Aczone 5 % topical gel  APPLY A SMALL AMOUNT TO AFFECTED AREA EVERY MORNING   docusate sodium (COLACE) 100 MG capsule Take 100 mg by mouth daily.   doxycycline  (VIBRAMYCIN ) 50 MG capsule TAKE 1 CAPSULE BY MOUTH EVERY DAY   DUPIXENT 300 MG/2ML SOPN Inject into the skin.   febuxostat  (ULORIC ) 40 MG tablet Take 1 tablet (40 mg total) by mouth daily.   ketoconazole  (NIZORAL ) 2 % shampoo Shampoo from the waist up let sit 5 minutes then wash off. Use twice per week for 2 mths. Then QM there after.   levothyroxine  (SYNTHROID ) 75 MCG tablet Take 1 tablet (75 mcg total) by mouth daily.   lisinopril  (ZESTRIL ) 10 MG tablet TAKE 1 TABLET BY MOUTH EVERY DAY   metroNIDAZOLE  (METROGEL ) 1 % gel  (Patient taking differently: daily as needed.)   montelukast  (SINGULAIR ) 10 MG tablet TAKE 1 TABLET BY MOUTH EVERYDAY AT BEDTIME   Multiple Vitamin (MULTIVITAMIN) capsule Take 1 capsule by mouth daily.   pantoprazole  (PROTONIX ) 40 MG tablet TAKE 1 TABLET BY MOUTH EVERY DAY   pravastatin (PRAVACHOL) 20 MG tablet Take 1 tablet (20 mg total) by mouth every evening.   Pyridoxine HCl 200 MG TBCR Take by mouth.   RYALTRIS 665-25 MCG/ACT SUSP SMARTSIG:1 Spray(s) Both Nares Every 12 Hours PRN (Patient taking differently: daily as needed.)   torsemide  (DEMADEX ) 10 MG tablet Take 1 tablet (10 mg total) by mouth daily.   TRELEGY ELLIPTA 200-62.5-25 MCG/ACT AEPB Inhale 1 puff into the lungs daily.   finasteride  (PROSCAR ) 5 MG tablet TAKE 1 TABLET BY MOUTH EVERY DAY (Patient not taking: Reported on 03/05/2024)   No  facility-administered encounter medications on file as of 03/05/2024.   Hearing/Vision screen Hearing Screening - Comments:: Wears hearing aids Vision Screening - Comments:: Glasses , has cataracts, Eye Doctor at [facility], up to date Immunizations and Health Maintenance Health Maintenance  Topic Date Due   COVID-19 Vaccine (5 - 2025-26 season) 12/19/2023   Medicare Annual Wellness (AWV)  03/05/2025   DTaP/Tdap/Td (3 - Td or Tdap) 02/14/2033   Pneumococcal Vaccine: 50+ Years  Completed   Influenza Vaccine  Completed   Hepatitis C Screening  Completed   Zoster Vaccines- Shingrix  Completed   Meningococcal B Vaccine  Aged Out   Colonoscopy  Discontinued        Assessment/Plan:  This is a routine wellness examination for U[place].  Patient Care Team: Bair, Kalpana, MD as PCP - General (Family Medicine) Darliss Rogue, MD as PCP - Cardiology (Cardiology) Perla Evalene PARAS, MD as Consulting Physician (Cardiology) Parris Manna, MD as Consulting Physician (Pulmonary Disease)  I have personally reviewed and noted the following in the patient's chart:   Medical and social history Use of alcohol, tobacco or illicit drugs  Current medications and supplements including opioid prescriptions. Functional ability and status Nutritional status Physical activity Advanced directives List of other physicians Hospitalizations, surgeries, and ER visits in previous 12 months Vitals Screenings to include cognitive, depression, and falls Referrals and appointments  No orders of the defined types were placed in this encounter.  In addition, I have reviewed and discussed with patient certain preventive protocols, quality metrics, and best practice recommendations. A written personalized care plan for preventive services as well as general preventive health recommendations were provided to patient.   Angeline Fredericks, LPN   88/82/7974   Return in 1 year (on 03/05/2025).  After Visit  Summary: (MyChart) Due to this being a telephonic visit, the after visit summary with patients personalized plan was offered to patient via MyChart   Nurse Notes: Discussed the need to update Covid vaccine . Phone note sent to Doc of Day

## 2024-03-05 NOTE — Patient Instructions (Addendum)

## 2024-03-06 DIAGNOSIS — M4306 Spondylolysis, lumbar region: Secondary | ICD-10-CM | POA: Diagnosis not present

## 2024-03-06 DIAGNOSIS — M9903 Segmental and somatic dysfunction of lumbar region: Secondary | ICD-10-CM | POA: Diagnosis not present

## 2024-03-06 DIAGNOSIS — M9905 Segmental and somatic dysfunction of pelvic region: Secondary | ICD-10-CM | POA: Diagnosis not present

## 2024-03-06 DIAGNOSIS — M5417 Radiculopathy, lumbosacral region: Secondary | ICD-10-CM | POA: Diagnosis not present

## 2024-03-06 NOTE — Telephone Encounter (Signed)
 Spoke to pt he states that he can just wait until his appt next mnth he stated that it would be fine he declined an earlier appt

## 2024-03-07 ENCOUNTER — Other Ambulatory Visit: Payer: Self-pay | Admitting: Internal Medicine

## 2024-03-07 DIAGNOSIS — M1A9XX0 Chronic gout, unspecified, without tophus (tophi): Secondary | ICD-10-CM

## 2024-03-26 DIAGNOSIS — M4306 Spondylolysis, lumbar region: Secondary | ICD-10-CM | POA: Diagnosis not present

## 2024-03-26 DIAGNOSIS — M9905 Segmental and somatic dysfunction of pelvic region: Secondary | ICD-10-CM | POA: Diagnosis not present

## 2024-03-26 DIAGNOSIS — M9903 Segmental and somatic dysfunction of lumbar region: Secondary | ICD-10-CM | POA: Diagnosis not present

## 2024-03-26 DIAGNOSIS — M5417 Radiculopathy, lumbosacral region: Secondary | ICD-10-CM | POA: Diagnosis not present

## 2024-04-04 ENCOUNTER — Other Ambulatory Visit: Payer: Self-pay | Admitting: Internal Medicine

## 2024-04-04 DIAGNOSIS — I10 Essential (primary) hypertension: Secondary | ICD-10-CM

## 2024-04-04 DIAGNOSIS — M1A0710 Idiopathic chronic gout, right ankle and foot, without tophus (tophi): Secondary | ICD-10-CM

## 2024-04-04 NOTE — Telephone Encounter (Signed)
 1. Primary hypertension - First office visit with me coming in 04/13/24. Refill appropriate.  - torsemide  (DEMADEX ) 10 MG tablet; TAKE 1 TABLET BY MOUTH EVERY DAY  Dispense: 90 tablet; Refill: 1  Neal Oshea, MD

## 2024-04-13 ENCOUNTER — Ambulatory Visit

## 2024-04-13 VITALS — BP 100/62 | HR 85 | Temp 98.7°F | Ht 70.0 in | Wt 212.2 lb

## 2024-04-13 DIAGNOSIS — I10 Essential (primary) hypertension: Secondary | ICD-10-CM

## 2024-04-13 DIAGNOSIS — M1A0710 Idiopathic chronic gout, right ankle and foot, without tophus (tophi): Secondary | ICD-10-CM

## 2024-04-13 DIAGNOSIS — L719 Rosacea, unspecified: Secondary | ICD-10-CM | POA: Diagnosis not present

## 2024-04-13 DIAGNOSIS — G4733 Obstructive sleep apnea (adult) (pediatric): Secondary | ICD-10-CM

## 2024-04-13 DIAGNOSIS — Z79899 Other long term (current) drug therapy: Secondary | ICD-10-CM | POA: Diagnosis not present

## 2024-04-13 DIAGNOSIS — E66811 Obesity, class 1: Secondary | ICD-10-CM | POA: Diagnosis not present

## 2024-04-13 DIAGNOSIS — Z125 Encounter for screening for malignant neoplasm of prostate: Secondary | ICD-10-CM

## 2024-04-13 DIAGNOSIS — E890 Postprocedural hypothyroidism: Secondary | ICD-10-CM | POA: Diagnosis not present

## 2024-04-13 DIAGNOSIS — R7303 Prediabetes: Secondary | ICD-10-CM

## 2024-04-13 DIAGNOSIS — K219 Gastro-esophageal reflux disease without esophagitis: Secondary | ICD-10-CM

## 2024-04-13 DIAGNOSIS — S39012S Strain of muscle, fascia and tendon of lower back, sequela: Secondary | ICD-10-CM | POA: Diagnosis not present

## 2024-04-13 DIAGNOSIS — E782 Mixed hyperlipidemia: Secondary | ICD-10-CM

## 2024-04-13 DIAGNOSIS — J4530 Mild persistent asthma, uncomplicated: Secondary | ICD-10-CM

## 2024-04-13 LAB — T4, FREE: Free T4: 0.89 ng/dL (ref 0.60–1.60)

## 2024-04-13 LAB — COMPREHENSIVE METABOLIC PANEL WITH GFR
ALT: 31 U/L (ref 3–53)
AST: 29 U/L (ref 5–37)
Albumin: 4.3 g/dL (ref 3.5–5.2)
Alkaline Phosphatase: 77 U/L (ref 39–117)
BUN: 18 mg/dL (ref 6–23)
CO2: 27 meq/L (ref 19–32)
Calcium: 9.1 mg/dL (ref 8.4–10.5)
Chloride: 102 meq/L (ref 96–112)
Creatinine, Ser: 1.11 mg/dL (ref 0.40–1.50)
GFR: 63.98 mL/min
Glucose, Bld: 99 mg/dL (ref 70–99)
Potassium: 4 meq/L (ref 3.5–5.1)
Sodium: 138 meq/L (ref 135–145)
Total Bilirubin: 0.9 mg/dL (ref 0.2–1.2)
Total Protein: 6.5 g/dL (ref 6.0–8.3)

## 2024-04-13 LAB — LIPID PANEL
Cholesterol: 141 mg/dL (ref 28–200)
HDL: 48.1 mg/dL
LDL Cholesterol: 67 mg/dL (ref 10–99)
NonHDL: 93.32
Total CHOL/HDL Ratio: 3
Triglycerides: 133 mg/dL (ref 10.0–149.0)
VLDL: 26.6 mg/dL (ref 0.0–40.0)

## 2024-04-13 LAB — URIC ACID: Uric Acid, Serum: 5.6 mg/dL (ref 4.0–7.8)

## 2024-04-13 LAB — MICROALBUMIN / CREATININE URINE RATIO
Creatinine,U: 99.4 mg/dL
Microalb Creat Ratio: UNDETERMINED mg/g (ref 0.0–30.0)
Microalb, Ur: <0.7 mg/dL

## 2024-04-13 LAB — PSA: PSA: 0.72 ng/mL (ref 0.10–4.00)

## 2024-04-13 LAB — TSH: TSH: 7.99 u[IU]/mL — ABNORMAL HIGH (ref 0.35–5.50)

## 2024-04-13 LAB — VITAMIN B12: Vitamin B-12: 292 pg/mL (ref 211–911)

## 2024-04-13 NOTE — Assessment & Plan Note (Signed)
 Intermittent back spasm managed with regular back massage and prn Flexeril  10 mg. Continue current treatment.

## 2024-04-13 NOTE — Progress Notes (Signed)
 "  Established Patient Office Visit TOC from Dr. Maribeth    Subjective  Patient ID: Alexander Fitzgerald, male    DOB: 24-Feb-1946  Age: 78 y.o. MRN: [id number]  Chief Complaint  Patient presents with   Establish Care    Discussed the use of AI scribe software for clinical note transcription with the patient, who gave verbal consent to proceed.  History of Present Illness Council Munguia is a 78 year old male who presents for transfer of care from previous PCP and chronic medication management.   - Hyperlipidemia:  Previously tried  atorvastatin , rosuvastatin  with muscle pain.  Currently is taking pravastatin  20 mg at nighttime and tolerating it well.  He is interested to see what his cholesterol level looks like and would like to reduce dose of pravastatin  to 10 mg daily.  Side effect of previous statin included elevated liver enzymes, muscle pain,  fatigue.    Patient underwent CT coronary score in 03/18/2022 which showed coronary calcium  score of 200.  Since then he has started aspirin  81 mg daily. He reports increased bruising since starting low-dose aspirin . He has a history of sun damage, which he believes contributes to his skin's fragility.  Asthma: He has a history of asthma and is currently using albuterol  as needed, Trelegy daily and dupilumab biweekly. He reports significant improvement in his symptoms, attributing much of his progress to dupilumab. He previously experienced chronic bronchitis and pharyngitis, which have improved with his current regimen. A recent pulmonary function test showed good results.  He follows up with Dr. Aleskerov at [facility] pulmonology for this.  He has a history of gout, with the worst flare occurring two years ago. He has had two previous bouts, diagnosed with high uric acid levels. He was treated with colchicine  0.6 mg daily, which was effective, but he is concerned about the long-term use of multiple medications affecting his liver enzymes. He  attempted to stop colchicine  three months ago but experienced a flare-up and resumed the medication. He is also on febuxostat  for uric acid management.  He has a history of thyroid  surgery due to a suspicious nodule. He is on levothyroxine  75 mcg, which was recently switched to a different generic brand, resulting in abnormal TSH levels. He takes his medication first thing in the morning and waits before eating. He is concerned about the impact of the brand switch on his thyroid  levels.  He has a history of a hiatal hernia repair and is on pantoprazole  40 mg for acid reflux management. He continues the medication as recommended by his surgeon.  He has a history of hypertension, managed with lisinopril  10 mg and torsemide  10 mg daily. He monitors his blood pressure at home, reporting good control with recent readings around 117/78 to 118/82.  He uses a CPAP machine for sleep apnea, diagnosed in 2017, and reports significant improvement in his sleep quality. He has a history of severe snoring and a deviated septum, which have been managed effectively with CPAP use.  He has a history of prediabetes and has been working on [employer]. He reports a weight loss from 222 pounds post-surgery to 208 pounds currently, with a goal to return to his COVID-era weight of 193 pounds. He has improved his diet and increased physical activity, walking his dog nearly three miles daily.  He has a history of back pain, managed with chiropractic care every three weeks, which has been effective in preventing flare-ups. He keeps cyclobenzaprine  on hand for  occasional use if needed.  He has a history of rosacea, treated with low-dose doxycycline , which he continues to take without issues.  He follows up with dermatologist closely.  Patient request screening for prostate cancer.    ROS As per HPI    Objective:     BP 100/62 (BP Location: Right Arm, Patient Position: Sitting, Cuff Size: Normal)   Pulse 85    Temp 98.7 F (37.1 C) (Oral)   Ht 5' 10 (1.778 m)   Wt 212 lb 3.2 oz (96.3 kg)   SpO2 94%   BMI 30.45 kg/m      04/13/2024   11:07 AM 03/05/2024    9:12 AM 09/28/2023    1:12 PM  Depression screen PHQ 2/9  Decreased Interest 0 0 0  Down, Depressed, Hopeless 0 0 0  PHQ - 2 Score 0 0 0  Altered sleeping 0 0 0  Tired, decreased energy 0 1 0  Change in appetite 0 0 0  Feeling bad or failure about yourself  0 0 0  Trouble concentrating 0 0 0  Moving slowly or fidgety/restless 0 0 0  Suicidal thoughts 0 0 0  PHQ-9 Score 0 1 0   Difficult doing work/chores Not difficult at all Not difficult at all Not difficult at all     Data saved with a previous flowsheet row definition      04/13/2024   11:07 AM 09/28/2023    1:12 PM 03/28/2023   10:38 AM 05/26/2022   10:46 AM  GAD 7 : Generalized Anxiety Score  Nervous, Anxious, on Edge 0 0 0 0  Control/stop worrying 0 0 0 0  Worry too much - different things 0 0 0 0  Trouble relaxing 0 0 0 0  Restless 0 0 0 0  Easily annoyed or irritable 0 0 0 0  Afraid - awful might happen 0 0 0 0  Total GAD 7 Score 0 0 0 0  Anxiety Difficulty Not difficult at all Not difficult at all Not difficult at all Not difficult at all      04/13/2024   11:07 AM 03/05/2024    9:12 AM 09/28/2023    1:12 PM  Depression screen PHQ 2/9  Decreased Interest 0 0 0  Down, Depressed, Hopeless 0 0 0  PHQ - 2 Score 0 0 0  Altered sleeping 0 0 0  Tired, decreased energy 0 1 0  Change in appetite 0 0 0  Feeling bad or failure about yourself  0 0 0  Trouble concentrating 0 0 0  Moving slowly or fidgety/restless 0 0 0  Suicidal thoughts 0 0 0  PHQ-9 Score 0 1 0   Difficult doing work/chores Not difficult at all Not difficult at all Not difficult at all     Data saved with a previous flowsheet row definition      04/13/2024   11:07 AM 09/28/2023    1:12 PM 03/28/2023   10:38 AM 05/26/2022   10:46 AM  GAD 7 : Generalized Anxiety Score  Nervous, Anxious, on Edge  0 0 0 0  Control/stop worrying 0 0 0 0  Worry too much - different things 0 0 0 0  Trouble relaxing 0 0 0 0  Restless 0 0 0 0  Easily annoyed or irritable 0 0 0 0  Afraid - awful might happen 0 0 0 0  Total GAD 7 Score 0 0 0 0  Anxiety Difficulty Not difficult at all Not difficult  at all Not difficult at all Not difficult at all   SDOH Screenings   Food Insecurity: No Food Insecurity (03/04/2024)  Housing: Low Risk (03/04/2024)  Transportation Needs: No Transportation Needs (03/04/2024)  Utilities: Not At Risk (03/05/2024)  Alcohol Screen: Low Risk (03/04/2024)  Depression (PHQ2-9): Low Risk (04/13/2024)  Financial Resource Strain: Low Risk (03/04/2024)  Physical Activity: Sufficiently Active (03/04/2024)  Social Connections: Socially Integrated (03/04/2024)  Stress: No Stress Concern Present (03/04/2024)  Tobacco Use: Medium Risk (04/13/2024)  Health Literacy: Adequate Health Literacy (03/05/2024)     Physical Exam Constitutional:      General: He is not in acute distress.    Appearance: Normal appearance.  HENT:     Head: Normocephalic and atraumatic.     Right Ear: Tympanic membrane normal. There is no impacted cerumen.     Left Ear: Tympanic membrane normal. There is no impacted cerumen.     Mouth/Throat:     Mouth: Mucous membranes are moist.  Neck:     Thyroid : No thyroid  mass or thyroid  tenderness.  Cardiovascular:     Rate and Rhythm: Normal rate and regular rhythm.  Pulmonary:     Effort: Pulmonary effort is normal.     Breath sounds: Normal breath sounds. No wheezing.  Abdominal:     General: Bowel sounds are normal.     Palpations: Abdomen is soft.     Tenderness: There is no guarding.  Musculoskeletal:     Cervical back: Neck supple. No rigidity.     Right lower leg: No edema.     Left lower leg: No edema.  Skin:    General: Skin is warm.  Neurological:     Mental Status: He is alert and oriented to person, place, and time.     Motor: No weakness.   Psychiatric:        Mood and Affect: Mood normal.        Speech: Speech normal.        Behavior: Behavior normal. Behavior is cooperative.        No results found for any visits on 04/13/24.  The ASCVD Risk score (Arnett DK, et al., 2019) failed to calculate for the following reasons:   The valid total cholesterol range is 130 to 320 mg/dL     Assessment & Plan:   Assessment & Plan Idiopathic chronic gout of right foot without tophus Chronic gout with recent flare-up after discontinuing colchicine  0.6 mg daily. Goal to maintain uric acid levels below 6 mg/dL. Check uric acid level.  Continue colchicine  0.6 mg daily and febuxostat  40 mg daily.  Consider trial of Allopurinol  in the future.  Ordered uric acid level. Monitor for gout flare-ups and adjust treatment as needed. Orders:   Uric acid  Postsurgical hypothyroidism On Levothyroxine  75 mcg daily. Hypothyroidism post-thyroidectomy with previous TSH level not within goal. Check TSH and T4. Will adjust levothyroxine  dose based on lab results.  Orders:   TSH   T4, free  Primary hypertension BP within goal, continue Torsemide  10 mg daily and Lisinipril 10 mg daily. Check urine microalbumin, CMP today to assess kidney function, electrolytes.  Orders:   Comp Met (CMET)   Urine Microalbumin w/creat. ratio  Gastroesophageal reflux disease without esophagitis Stable on Protonix  40 mg daily for prophylaxis, h/o hiatal hernia surgery in the past. Continue. Will check B12 level.     Pre-diabetes A1c in prediabetic range. Recent weight loss noted. Encouraged patient to continue working on lifestyle modifications to help achieve  goal weight around 190 pounds. Check A1c level. Orders:   Hemoglobin A1c  Hyperlipidemia, mixed Tolerating Pravastatin  20 mg daily. Continue.  Check lipid panel today.  Has a h/o elevated coronary calcium  score, continue Aspirin  81 mg daily.  Orders:   Lipid panel  OSA (obstructive sleep  apnea) Managed with CPAP since 2017. Referred to sleep specialist for CPAP management and evaluation. Orders:   Ambulatory referral to Pulmonology  Screening for prostate cancer Discussed risks and benefits of PSA check, PSA ordered upon patient request. Orders:   PSA  Medication management On Protonix  40 mg daily, check vitamin B12 level.  Orders:   B12  Obesity, Class I, BMI 30-34.9 Recent weight loss noted. Encouraged patient to continue working on lifestyle modifications to help achieve goal weight around 190 pounds.     Rosacea Continue follow up with dermatology.     Back strain, sequela Intermittent back spasm managed with regular back massage and prn Flexeril  10 mg. Continue current treatment.     Mild persistent asthma without complication Well-controlled with Dupixent, Trelegy, as-needed albuterol  and daily singular. Continue current treatment and regular follow up with pulmonology for ongoing management.    I personally spent a total of 45 minutes in the care of the patient today including preparing to see the patient, getting/reviewing separately obtained history, performing a medically appropriate exam/evaluation, counseling and educating, placing orders, referring and communicating with other health care professionals, documenting clinical information in the EHR, independently interpreting results, and communicating results.   Return in about 6 months (around 10/12/2024) for Chronic follow up .   Alexander Shade, MD "

## 2024-04-13 NOTE — Assessment & Plan Note (Signed)
 Well-controlled with Dupixent, Trelegy, as-needed albuterol  and daily singular. Continue current treatment and regular follow up with pulmonology for ongoing management.

## 2024-04-13 NOTE — Assessment & Plan Note (Addendum)
 Managed with CPAP since 2017. Referred to sleep specialist for CPAP management and evaluation. Orders:   Ambulatory referral to Pulmonology

## 2024-04-13 NOTE — Assessment & Plan Note (Addendum)
 Stable on Protonix  40 mg daily for prophylaxis, h/o hiatal hernia surgery in the past. Continue. Will check B12 level.

## 2024-04-13 NOTE — Assessment & Plan Note (Addendum)
 Discussed risks and benefits of PSA check, PSA ordered upon patient request. Orders:   PSA

## 2024-04-13 NOTE — Assessment & Plan Note (Addendum)
 A1c in prediabetic range. Recent weight loss noted. Encouraged patient to continue working on lifestyle modifications to help achieve goal weight around 190 pounds. Check A1c level. Orders:   Hemoglobin A1c

## 2024-04-13 NOTE — Assessment & Plan Note (Addendum)
 On Levothyroxine  75 mcg daily. Hypothyroidism post-thyroidectomy with previous TSH level not within goal. Check TSH and T4. Will adjust levothyroxine  dose based on lab results.  Orders:   TSH   T4, free

## 2024-04-13 NOTE — Assessment & Plan Note (Addendum)
 Recent weight loss noted. Encouraged patient to continue working on lifestyle modifications to help achieve goal weight around 190 pounds.

## 2024-04-13 NOTE — Assessment & Plan Note (Signed)
 Continue follow up with dermatology.

## 2024-04-13 NOTE — Assessment & Plan Note (Addendum)
 BP within goal, continue Torsemide  10 mg daily and Lisinipril 10 mg daily. Check urine microalbumin, CMP today to assess kidney function, electrolytes.  Orders:   Comp Met (CMET)   Urine Microalbumin w/creat. ratio

## 2024-04-13 NOTE — Assessment & Plan Note (Addendum)
 On Protonix  40 mg daily, check vitamin B12 level.  Orders:   B12

## 2024-04-13 NOTE — Assessment & Plan Note (Addendum)
 Tolerating Pravastatin  20 mg daily. Continue.  Check lipid panel today.  Has a h/o elevated coronary calcium  score, continue Aspirin  81 mg daily.  Orders:   Lipid panel

## 2024-04-13 NOTE — Assessment & Plan Note (Addendum)
 Chronic gout with recent flare-up after discontinuing colchicine  0.6 mg daily. Goal to maintain uric acid levels below 6 mg/dL. Check uric acid level.  Continue colchicine  0.6 mg daily and febuxostat  40 mg daily.  Consider trial of Allopurinol  in the future.  Ordered uric acid level. Monitor for gout flare-ups and adjust treatment as needed. Orders:   Uric acid

## 2024-04-16 LAB — HEMOGLOBIN A1C: Hgb A1c MFr Bld: 6.3 % (ref 4.6–6.5)

## 2024-04-23 ENCOUNTER — Ambulatory Visit: Payer: Self-pay

## 2024-04-23 ENCOUNTER — Encounter: Payer: Self-pay | Admitting: Cardiology

## 2024-04-23 DIAGNOSIS — E890 Postprocedural hypothyroidism: Secondary | ICD-10-CM

## 2024-04-23 DIAGNOSIS — I10 Essential (primary) hypertension: Secondary | ICD-10-CM

## 2024-04-23 DIAGNOSIS — J4530 Mild persistent asthma, uncomplicated: Secondary | ICD-10-CM

## 2024-04-23 DIAGNOSIS — K219 Gastro-esophageal reflux disease without esophagitis: Secondary | ICD-10-CM

## 2024-04-23 DIAGNOSIS — M1A0710 Idiopathic chronic gout, right ankle and foot, without tophus (tophi): Secondary | ICD-10-CM

## 2024-04-23 MED ORDER — CYCLOBENZAPRINE HCL 10 MG PO TABS: 10.0000 mg | ORAL_TABLET | Freq: Three times a day (TID) | ORAL | 0 refills | Status: AC | PRN

## 2024-04-23 MED ORDER — COLCHICINE 0.6 MG PO TABS: 0.6000 mg | ORAL_TABLET | Freq: Every day | ORAL | 1 refills | Status: AC

## 2024-04-23 MED ORDER — LEVOTHYROXINE SODIUM 100 MCG PO TABS: 100.0000 ug | ORAL_TABLET | Freq: Every day | ORAL | 1 refills | Status: DC

## 2024-04-23 MED ORDER — MONTELUKAST SODIUM 10 MG PO TABS: ORAL_TABLET | ORAL | 3 refills | Status: AC

## 2024-04-23 MED ORDER — FEBUXOSTAT 40 MG PO TABS: 40.0000 mg | ORAL_TABLET | Freq: Every day | ORAL | 3 refills | Status: AC

## 2024-04-23 MED ORDER — PANTOPRAZOLE SODIUM 40 MG PO TBEC: 40.0000 mg | DELAYED_RELEASE_TABLET | Freq: Every day | ORAL | 3 refills | Status: AC

## 2024-04-23 MED ORDER — LEVOTHYROXINE SODIUM 88 MCG PO TABS: 88.0000 ug | ORAL_TABLET | Freq: Every day | ORAL | 0 refills | Status: AC

## 2024-04-23 MED ORDER — ALBUTEROL SULFATE HFA 108 (90 BASE) MCG/ACT IN AERS: INHALATION_SPRAY | RESPIRATORY_TRACT | 2 refills | Status: AC

## 2024-04-23 MED ORDER — LISINOPRIL 10 MG PO TABS: 10.0000 mg | ORAL_TABLET | Freq: Every day | ORAL | 3 refills | Status: AC

## 2024-04-23 NOTE — Progress Notes (Signed)
 1. Idiopathic chronic gout of right foot without tophus - colchicine  0.6 MG tablet; Take 1 tablet (0.6 mg total) by mouth daily.  Dispense: 90 tablet; Refill: 1 - febuxostat  (ULORIC ) 40 MG tablet; Take 1 tablet (40 mg total) by mouth daily.  Dispense: 90 tablet; Refill: 3  2. Primary hypertension - lisinopril  (ZESTRIL ) 10 MG tablet; Take 1 tablet (10 mg total) by mouth daily.  Dispense: 90 tablet; Refill: 3  3. Gastroesophageal reflux disease without esophagitis - pantoprazole  (PROTONIX ) 40 MG tablet; Take 1 tablet (40 mg total) by mouth daily.  Dispense: 90 tablet; Refill: 3  4. Mild persistent asthma without complication - albuterol  (VENTOLIN  HFA) 108 (90 Base) MCG/ACT inhaler; INHALE 2 PUFFS 4 TIMES A DAY AS NEEDED  Dispense: 8.5 each; Refill: 2  5. Postsurgical hypothyroidism (Primary) - levothyroxine  (SYNTHROID ) 100 MCG tablet; Take 1 tablet (100 mcg total) by mouth daily.  Dispense: 90 tablet; Refill: 1 - TSH; Future

## 2024-05-09 ENCOUNTER — Encounter: Payer: Self-pay | Admitting: Cardiology

## 2024-05-09 ENCOUNTER — Ambulatory Visit: Attending: Cardiology | Admitting: Cardiology

## 2024-05-09 VITALS — BP 126/60 | HR 81 | Ht 70.0 in | Wt 209.4 lb

## 2024-05-09 DIAGNOSIS — I2510 Atherosclerotic heart disease of native coronary artery without angina pectoris: Secondary | ICD-10-CM | POA: Diagnosis not present

## 2024-05-09 DIAGNOSIS — I10 Essential (primary) hypertension: Secondary | ICD-10-CM | POA: Diagnosis not present

## 2024-05-09 NOTE — Patient Instructions (Signed)

## 2024-05-09 NOTE — Progress Notes (Signed)
 " Cardiology Office Note:    Date:  05/09/2024   ID:  Alexander Fitzgerald, DOB 1946-02-05, MRN [id number]  PCP:  Abbey Bruckner, MD   [facility] Providers Cardiologist:  Redell Cave, MD     Referring MD: Abbey Bruckner, MD   Chief Complaint  Patient presents with   Follow-up    12 month follow up pat has been doing well with no complaints of chest pain, chest pressure or SOB, medication reviewed verbally with patient    History of Present Illness:    Alexander Fitzgerald is a 79 y.o. male with a hx of nonobstructive CAD (mild proximal LAD, minimal distal RCA, Calcium  score 200 on CCTA 11/23), hypertension, asthma, former smoker x20+ years who presents for follow-up.    Doing okay, denies chest pain or shortness of breath.  Previously did not tolerate Crestor , switched to Pravachol  with good effect.  Cholesterol adequately controlled.  Compliant with aspirin , lisinopril  as prescribed, BP well-controlled.  Feels well, has no cardiac concerns.  Prior notes Echo 03/2022 EF 55 to 60%, impaired relaxation. CCTA 11/23 mild proximal LAD, minimal distal RCA, Calcium  score 200 Calcium  score 2017- 106  Past Medical History:  Diagnosis Date   Allergy 1955   Sulfa   Arthritis 2010   Gout   Asthma    adult onset, PFT[place] done in [place]   Basal cell carcinoma 11/25/2022   Left Parietal Scalp, EDC   BCC (basal cell carcinoma) 01/03/2024   R preauricular, tx Dr. Corey 02/06/2024   Cancer [facility])    Several moles removed in past, all pre-cancerous.  Recently followed by Dr. Hester   Cataract 2024   Just starting   COPD (chronic obstructive pulmonary disease) (HCC) 2022   Treated   Depression    Treated with Prozac in 1992,  Associate with job loss   Esophageal reflux    Gout    Hiatal hernia    Hyperlipidemia 2024   Treated   Hypertension    Sleep apnea 2016   Use cpap always   Squamous cell carcinoma of skin 07/30/2015   R anterior lat neck, Atypical squamous proliferation,  excised 08/28/2015   Thyroid  disease 2015   Removed    Past Surgical History:  Procedure Laterality Date   APPENDECTOMY     COLONOSCOPY N/A 12/27/2014   Procedure: COLONOSCOPY;  Surgeon: Gladis Koa Mariner, MD;  Location: [facility] ENDOSCOPY;  Service: Endoscopy;  Laterality: N/A;   HERNIA REPAIR  1973/2023   JOINT REPLACEMENT  04/2023   Rt hip   LAPAROSCOPIC PARAESOPHAGEAL HERNIA REPAIR     SKIN CANCER EXCISION  01/2024   TONSILLECTOMY     TOTAL HIP ARTHROPLASTY Right 04/2023    Current Medications: Current Meds  Medication Sig   albuterol  (VENTOLIN  HFA) 108 (90 Base) MCG/ACT inhaler INHALE 2 PUFFS 4 TIMES A DAY AS NEEDED   Ascorbic Acid (VITAMIN C) 1000 MG tablet Take 1,000 mg by mouth daily. Reported on 06/26/2015   aspirin  EC 81 MG tablet Take 81 mg by mouth daily. Swallow whole.   Cholecalciferol (VITAMIN D3) 2000 units capsule Take 4,000 Units by mouth.   colchicine  0.6 MG tablet Take 1 tablet (0.6 mg total) by mouth daily.   Cyanocobalamin  (VITAMIN B-12) 5000 MCG SUBL Place 2 each under the tongue daily.   cyclobenzaprine  (FLEXERIL ) 10 MG tablet Take 1 tablet (10 mg total) by mouth 3 (three) times daily as needed for muscle spasms.   Dapsone 5 % topical gel Aczone  5 % topical gel  APPLY A SMALL AMOUNT TO AFFECTED AREA EVERY MORNING   docusate sodium (COLACE) 100 MG capsule Take 100 mg by mouth daily.   doxycycline  (VIBRAMYCIN ) 50 MG capsule TAKE 1 CAPSULE BY MOUTH EVERY DAY   DUPIXENT 300 MG/2ML SOPN Inject into the skin.   febuxostat  (ULORIC ) 40 MG tablet Take 1 tablet (40 mg total) by mouth daily.   ketoconazole  (NIZORAL ) 2 % shampoo Shampoo from the waist up let sit 5 minutes then wash off. Use twice per week for 2 mths. Then QM there after.   levothyroxine  (SYNTHROID ) 88 MCG tablet Take 1 tablet (88 mcg total) by mouth daily.   lisinopril  (ZESTRIL ) 10 MG tablet Take 1 tablet (10 mg total) by mouth daily.   metroNIDAZOLE  (METROGEL ) 1 % gel  (Patient taking differently: daily as  needed.)   montelukast  (SINGULAIR ) 10 MG tablet TAKE 1 TABLET BY MOUTH EVERYDAY AT BEDTIME   Multiple Vitamin (MULTIVITAMIN) capsule Take 1 capsule by mouth daily.   pantoprazole  (PROTONIX ) 40 MG tablet Take 1 tablet (40 mg total) by mouth daily.   pravastatin  (PRAVACHOL ) 20 MG tablet Take 1 tablet (20 mg total) by mouth every evening.   Pyridoxine HCl 200 MG TBCR Take by mouth.   RYALTRIS 665-25 MCG/ACT SUSP SMARTSIG:1 Spray(s) Both Nares Every 12 Hours PRN (Patient taking differently: daily as needed.)   torsemide  (DEMADEX ) 10 MG tablet TAKE 1 TABLET BY MOUTH EVERY DAY   TRELEGY ELLIPTA 200-62.5-25 MCG/ACT AEPB Inhale 1 puff into the lungs daily.     Allergies:   Sulfa drugs cross reactors   Social History   Socioeconomic History   Marital status: Married    Spouse name: Not on file   Number of children: Not on file   Years of education: Not on file   Highest education level: Master's degree (e.g., MA, MS, MEng, MEd, MSW, MBA)  Occupational History   Not on file  Tobacco Use   Smoking status: Former    Current packs/day: 0.00    Average packs/day: 0.5 packs/day for 20.0 years (10.0 ttl pk-yrs)    Types: Cigarettes    Start date: 06/15/1979    Quit date: 06/15/1999    Years since quitting: 24.9   Smokeless tobacco: Never  Substance and Sexual Activity   Alcohol use: Not Currently    Alcohol/week: 1.0 standard drink of alcohol    Types: 1 Standard drinks or equivalent per week    Comment: rarely   Drug use: No   Sexual activity: Yes  Other Topics Concern   Not on file  Social History Narrative   married   Social Drivers of Health   Tobacco Use: Medium Risk (05/09/2024)   Patient History    Smoking Tobacco Use: Former    Smokeless Tobacco Use: Never    Passive Exposure: Not on Actuary Strain: Low Risk (03/04/2024)   Overall Financial Resource Strain (CARDIA)    Difficulty of Paying Living Expenses: Not hard at all  Food Insecurity: No Food Insecurity  (03/04/2024)   Epic    Worried About Programme Researcher, Broadcasting/film/video in the Last Year: Never true    Ran Out of Food in the Last Year: Never true  Transportation Needs: No Transportation Needs (03/04/2024)   Epic    Lack of Transportation (Medical): No    Lack of Transportation (Non-Medical): No  Physical Activity: Sufficiently Active (03/04/2024)   Exercise Vital Sign    Days of Exercise per Week:  7 days    Minutes of Exercise per Session: 60 min  Stress: No Stress Concern Present (03/04/2024)   [employer] of Occupational Health - Occupational Stress Questionnaire    Feeling of Stress: Not at all  Social Connections: Socially Integrated (03/04/2024)   Social Connection and Isolation Panel    Frequency of Communication with Friends and Family: Three times a week    Frequency of Social Gatherings with Friends and Family: Once a week    Attends Religious Services: More than 4 times per year    Active Member of Clubs or Organizations: Yes    Attends Banker Meetings: More than 4 times per year    Marital Status: Married  Depression (PHQ2-9): Low Risk (04/13/2024)   Depression (PHQ2-9)    PHQ-2 Score: 0  Alcohol Screen: Low Risk (03/04/2024)   Alcohol Screen    Last Alcohol Screening Score (AUDIT): 1  Housing: Low Risk (03/04/2024)   Epic    Unable to Pay for Housing in the Last Year: No    Number of Times Moved in the Last Year: 0    Homeless in the Last Year: No  Utilities: Not At Risk (03/05/2024)   Epic    Threatened with loss of utilities: No  Health Literacy: Adequate Health Literacy (03/05/2024)   B1300 Health Literacy    Frequency of need for help with medical instructions: Never     Family History: The patient's family history includes Cancer in his sister; Diabetes in his mother; Heart attack (age of onset: 11) in his father; Hyperlipidemia in his mother; Hypertension in his father and mother.  ROS:   Please see the history of present illness.     All  other systems reviewed and are negative.  EKGs/Labs/Other Studies Reviewed:    The following studies were reviewed today:   EKG Interpretation Date/Time:  Wednesday May 09 2024 10:36:17 EST Ventricular Rate:  81 PR Interval:  138 QRS Duration:  150 QT Interval:  424 QTC Calculation: 492 R Axis:   74  Text Interpretation: Normal sinus rhythm Right bundle branch block Confirmed by Darliss Rogue ([id number]) on 05/09/2024 10:48:28 AM    Recent Labs: 04/13/2024: ALT 31; BUN 18; Creatinine, Ser 1.11; Potassium 4.0; Sodium 138; TSH 7.99  Recent Lipid Panel    Component Value Date/Time   CHOL 141 04/13/2024 1150   CHOL 120 06/27/2023 1056   TRIG 133.0 04/13/2024 1150   HDL 48.10 04/13/2024 1150   HDL 46 06/27/2023 1056   CHOLHDL 3 04/13/2024 1150   VLDL 26.6 04/13/2024 1150   LDLCALC 67 04/13/2024 1150   LDLCALC 44 06/27/2023 1056     Risk Assessment/Calculations:               Physical Exam:    VS:  BP 126/60 (BP Location: Left Arm, Patient Position: Sitting, Cuff Size: Normal)   Pulse 81   Ht 5' 10 (1.778 m)   Wt 209 lb 6.4 oz (95 kg)   SpO2 96%   BMI 30.05 kg/m     Wt Readings from Last 3 Encounters:  05/09/24 209 lb 6.4 oz (95 kg)  04/13/24 212 lb 3.2 oz (96.3 kg)  03/05/24 209 lb (94.8 kg)     GEN:  Well nourished, well developed in no acute distress HEENT: Normal NECK: No JVD; No carotid bruits CARDIAC: RRR, no murmurs, rubs, gallops RESPIRATORY:  Clear to auscultation without rales, wheezing or rhonchi  ABDOMEN: Soft, non-tender, non-distended MUSCULOSKELETAL:  No edema;  No deformity  SKIN: Warm and dry NEUROLOGIC:  Alert and oriented x 3 PSYCHIATRIC:  Normal affect   ASSESSMENT:    1. Coronary artery disease without angina pectoris, unspecified vessel or lesion type, unspecified whether native or transplanted heart   2. Primary hypertension    PLAN:    In order of problems listed above:  Nonobstructive CAD, mild proximal LAD, minimal  distal RCA, Calcium  score 200 on CCTA 11/23.  EF 55 to 60%.  Continue aspirin  81 mg daily, Pravachol  20 mg daily.   Hypertension, BP controlled, continue lisinopril  10 mg daily.  Follow-up in 1 year.    Medication Adjustments/Labs and Tests Ordered: Current medicines are reviewed at length with the patient today.  Concerns regarding medicines are outlined above.  Orders Placed This Encounter  Procedures   EKG 12-Lead   No orders of the defined types were placed in this encounter.   Patient Instructions  Medication Instructions:  Your physician recommends that you continue on your current medications as directed. Please refer to the Current Medication list given to you today.  *If you need a refill on your cardiac medications before your next appointment, please call your pharmacy*  Lab Work: No labs ordered today  If you have labs (blood work) drawn today and your tests are completely normal, you will receive your results only by: MyChart Message (if you have MyChart) OR A paper copy in the mail If you have any lab test that is abnormal or we need to change your treatment, we will call you to review the results.  Testing/Procedures: No test ordered today   Follow-Up: At [facility], you and your health needs are our priority.  As part of our continuing mission to provide you with exceptional heart care, our providers are all part of one team.  This team includes your primary Cardiologist (physician) and Advanced Practice Providers or APPs (Physician Assistants and Nurse Practitioners) who all work together to provide you with the care you need, when you need it.  Your next appointment:   1 year(s)  Provider:   You may see Redell Cave, MD or one of the following Advanced Practice Providers on your designated Care Team:   Lonni Meager, NP Lesley Maffucci, PA-C Bernardino Bring, PA-C Cadence Sidney, PA-C Tylene Lunch, NP Barnie Hila, NP    We recommend  signing up for the patient portal called MyChart.  Sign up information is provided on this After Visit Summary.  MyChart is used to connect with patients for Virtual Visits (Telemedicine).  Patients are able to view lab/test results, encounter notes, upcoming appointments, etc.  Non-urgent messages can be sent to your provider as well.   To learn more about what you can do with MyChart, go to [email or web address].              Signed, Redell Cave, MD  05/09/2024 11:28 AM    [facility] "

## 2024-05-15 ENCOUNTER — Ambulatory Visit: Admitting: Sleep Medicine

## 2024-05-15 NOTE — Telephone Encounter (Signed)
 Appointment has been rescheduled.   Nothing further needed.

## 2024-05-22 NOTE — Telephone Encounter (Deleted)
 LMTCB. E2C2 please advise when patient calls back.

## 2024-05-22 NOTE — Telephone Encounter (Signed)
 Noted. Nothing further needed.

## 2024-05-23 ENCOUNTER — Encounter: Payer: Self-pay | Admitting: Sleep Medicine

## 2024-05-23 ENCOUNTER — Ambulatory Visit: Admitting: Sleep Medicine

## 2024-05-23 VITALS — BP 120/82 | HR 85 | Temp 98.1°F | Ht 70.0 in | Wt 208.2 lb

## 2024-05-23 DIAGNOSIS — I10 Essential (primary) hypertension: Secondary | ICD-10-CM

## 2024-05-23 DIAGNOSIS — G4733 Obstructive sleep apnea (adult) (pediatric): Secondary | ICD-10-CM

## 2024-05-23 DIAGNOSIS — Z87891 Personal history of nicotine dependence: Secondary | ICD-10-CM

## 2024-05-23 NOTE — Progress Notes (Signed)
 "      Name:Alexander Fitzgerald MRN: [id number] DOB: [birth year]   CHIEF COMPLAINT:  ESTABLISH CARE FOR OSA   HISTORY OF PRESENT ILLNESS: Alexander Fitzgerald is a 79 y.o. w/ a h/o OSA, HTN, hypothyroidism, GERD, hyperlipidemia, asthma and asthma who presents to establish care for OSA. Reports that he was diagnosed with OSA around 10 years ago and was subsequently started on CPAP therapy. Reports using CPAP therapy every night, which is confirmed by compliance data. He is currently using the Airfit P10 nasal pillow mask, which is comfortable. Reports feeling more refreshed upon awakening with CPAP therapy.   Reports significant weight changes. Admits to dry mouth. Denies morning headaches, RLS symptoms, dream enactment, cataplexy, hypnagogic or hypnapompic hallucinations. Denies a family history of sleep apnea. Denies drowsy driving. Drinks 2 cups of coffee daily, denies alcohol or drug use. Former smoker.    Bedtime 1:30-2:30 am Sleep onset 20 mins Rise time 9 am   PAST MEDICAL HISTORY :   has a past medical history of Allergy (1955), Arthritis (2010), Asthma, Basal cell carcinoma (11/25/2022), BCC (basal cell carcinoma) (01/03/2024), Cancer (HCC), Cataract (2024), COPD (chronic obstructive pulmonary disease) (HCC) (2022), Depression, Esophageal reflux, Gout, Hiatal hernia, Hyperlipidemia (2024), Hypertension, Sleep apnea (2016), Squamous cell carcinoma of skin (07/30/2015), and Thyroid  disease (2015).  has a past surgical history that includes Appendectomy; Tonsillectomy; Laparoscopic paraesophageal hernia repair; Colonoscopy (N/A, 12/27/2014); Total hip arthroplasty (Right, 04/2023); Skin cancer excision (01/2024); Hernia repair (1973/2023); and Joint replacement (04/2023). Prior to Admission medications  Medication Sig Start Date End Date Taking? Authorizing Provider  albuterol  (VENTOLIN  HFA) 108 (90 Base) MCG/ACT inhaler INHALE 2 PUFFS 4 TIMES A DAY AS NEEDED 04/23/24   Bair, Kalpana, MD  Ascorbic  Acid (VITAMIN C) 1000 MG tablet Take 1,000 mg by mouth daily. Reported on 06/26/2015    [provider]  aspirin  EC 81 MG tablet Take 81 mg by mouth daily. Swallow whole.    [provider]  Cholecalciferol (VITAMIN D3) 2000 units capsule Take 4,000 Units by mouth.    [provider]  colchicine  0.6 MG tablet Take 1 tablet (0.6 mg total) by mouth daily. 04/23/24   Bair, Kalpana, MD  Cyanocobalamin  (VITAMIN B-12) 5000 MCG SUBL Place 2 each under the tongue daily.    [provider]  cyclobenzaprine  (FLEXERIL ) 10 MG tablet Take 1 tablet (10 mg total) by mouth 3 (three) times daily as needed for muscle spasms. 04/23/24   Bair, Kalpana, MD  Dapsone 5 % topical gel Aczone 5 % topical gel  APPLY A SMALL AMOUNT TO AFFECTED AREA EVERY MORNING    [provider]  docusate sodium (COLACE) 100 MG capsule Take 100 mg by mouth daily.    [provider]  doxycycline  (VIBRAMYCIN ) 50 MG capsule TAKE 1 CAPSULE BY MOUTH EVERY DAY 10/10/23   Hester Alm BROCKS, MD  DUPIXENT 300 MG/2ML SOPN Inject into the skin. 01/12/22   [provider]  febuxostat  (ULORIC ) 40 MG tablet Take 1 tablet (40 mg total) by mouth daily. 04/23/24   Bair, Kalpana, MD  ketoconazole  (NIZORAL ) 2 % shampoo Shampoo from the waist up let sit 5 minutes then wash off. Use twice per week for 2 mths. Then QM there after. 02/04/21   Hester Alm BROCKS, MD  levothyroxine  (SYNTHROID ) 88 MCG tablet Take 1 tablet (88 mcg total) by mouth daily. 04/23/24   Bair, Kalpana, MD  lisinopril  (ZESTRIL ) 10 MG tablet Take 1 tablet (10 mg total) by mouth daily.  04/23/24   Bair, Kalpana, MD  metroNIDAZOLE  (METROGEL ) 1 % gel     [provider]  montelukast  (SINGULAIR ) 10 MG tablet TAKE 1 TABLET BY MOUTH EVERYDAY AT BEDTIME 04/23/24   Bair, Kalpana, MD  Multiple Vitamin (MULTIVITAMIN) capsule Take 1 capsule by mouth daily.    [provider]  pantoprazole  (PROTONIX ) 40 MG tablet Take 1 tablet (40 mg total)  by mouth daily. 04/23/24   Bair, Kalpana, MD  pravastatin  (PRAVACHOL ) 20 MG tablet Take 1 tablet (20 mg total) by mouth every evening. 01/31/24 05/09/24  Darliss Rogue, MD  Pyridoxine HCl 200 MG TBCR Take by mouth.    [provider]  PARRIS [phone] MCG/ACT SUSP SMARTSIG:1 Spray(s) Both Nares Every 12 Hours PRN Patient taking differently: daily as needed. 04/08/22   [provider]  torsemide  (DEMADEX ) 10 MG tablet TAKE 1 TABLET BY MOUTH EVERY DAY 04/04/24   Bair, Luke, MD  TRELEGY ELLIPTA 200-62.5-25 MCG/ACT AEPB Inhale 1 puff into the lungs daily. 08/30/21   [provider]   Allergies[1]  FAMILY HISTORY:  family history includes Cancer in his sister; Diabetes in his mother; Heart attack (age of onset: 55) in his father; Hyperlipidemia in his mother; Hypertension in his father and mother. SOCIAL HISTORY:  reports that he quit smoking about 24 years ago. His smoking use included cigarettes. He started smoking about 44 years ago. He has a 10 pack-year smoking history. He has never used smokeless tobacco. He reports that he does not currently use alcohol after a past usage of about 1.0 standard drink of alcohol per week. He reports that he does not use drugs.   Review of Systems:  Gen:  Denies  fever, sweats, chills weight loss  HEENT: Denies blurred vision, double vision, ear pain, eye pain, hearing loss, nose bleeds, sore throat Cardiac:  No dizziness, chest pain or heaviness, chest tightness,edema, No JVD Resp:   No cough, -sputum production, -shortness of breath,-wheezing, -hemoptysis,  Gi: Denies swallowing difficulty, stomach pain, nausea or vomiting, diarrhea, constipation, bowel incontinence Gu:  Denies bladder incontinence, burning urine Ext:   Denies Joint pain, stiffness or swelling Skin: Denies  skin rash, easy bruising or bleeding or hives Endoc:  Denies polyuria, polydipsia , polyphagia or weight change Psych:   Denies depression, insomnia or  hallucinations  Other:  All other systems negative  VITAL SIGNS: BP 120/82   Pulse 85   Temp 98.1 F (36.7 C)   Ht 5' 10 (1.778 m)   Wt 208 lb 3.2 oz (94.4 kg)   SpO2 97%   BMI 29.87 kg/m    Physical Examination:   General Appearance: No distress  EYES PERRLA, EOM intact.   NECK Supple, No JVD Pulmonary: normal breath sounds, No wheezing.  CardiovascularNormal S1,S2.  No m/r/g.   Abdomen: Benign, Soft, non-tender. Skin:   warm, no rashes, no ecchymosis  Extremities: normal, no cyanosis, clubbing. Neuro:without focal findings,  speech normal  PSYCHIATRIC: Mood, affect within normal limits.   ASSESSMENT AND PLAN  OSA Patient is using and benefiting from CPAP therapy. For air leaks, will try patient on the Airtouch F30i FFM. Discussed the consequences of untreated sleep apnea. Advised not to drive drowsy for safety of patient and others. Will follow up in 1 year.     HTN Stable, on current management. Following with PCP.    Patient  satisfied with Plan of action and management. All questions answered  I spent a total of 51 minutes reviewing chart data,  face-to-face evaluation with the patient, counseling and coordination of care as detailed above.    Rayon Mcchristian, M.D.  Sleep Medicine [facility]           [1]  Allergies Allergen Reactions   Sulfa Drugs Cross Reactors Other (See Comments)    Urinary bleeding   "

## 2024-05-23 NOTE — Patient Instructions (Addendum)
" °  Patient Instructions Need to be using your CPAP every night, minimum of 7-8 hours a night.  Change equipment as directed. Wash your tubing with warm soap and water  weekly, hang to dry. Wash humidifier portion weekly. Use bottled, distilled water  and change daily Be aware of reduced alertness and do not drive or operate heavy machinery if experiencing this or drowsiness.  Exercise encouraged, as tolerated. Healthy weight management discussed.  Avoid or decrease alcohol consumption and medications that make you more sleepy, if possible. Notify if persistent daytime sleepiness occurs even with consistent use of PAP therapy.   Change CPAP supplies... Every month Mask cushions and/or nasal pillows CPAP machine filters Every 3 months Mask frame (not including the headgear) CPAP tubing Every 6 months Mask headgear Chin strap (if applicable) Humidifier water  tub   Be aware of reduced alertness and do not drive or operate heavy machinery if experiencing this or drowsiness.  Exercise encouraged, as tolerated. Encouraged proper weight management.  Important to get eight or more hours of sleep  Limiting the use of the computer and television before bedtime.  Decrease naps during the day, so night time sleep will become enhanced.  Limit caffeine, and sleep deprivation.  HTN, stroke, uncontrolled diabetes and heart failure are potential risk factors.  Risk of untreated sleep apnea including cardiac arrhthymias, stroke, DM, pulm HTN.           "

## 2024-06-04 ENCOUNTER — Ambulatory Visit: Admitting: Podiatry

## 2024-08-22 ENCOUNTER — Ambulatory Visit

## 2024-10-15 ENCOUNTER — Ambulatory Visit

## 2025-03-11 ENCOUNTER — Ambulatory Visit
